# Patient Record
Sex: Male | Born: 1960 | Race: White | Hispanic: No | Marital: Single | Smoking: Current every day smoker
Health system: Southern US, Community
[De-identification: ages and names within clinical notes are randomized; demographics above are authoritative.]

## PROBLEM LIST (undated history)

## (undated) DIAGNOSIS — M869 Osteomyelitis, unspecified: Secondary | ICD-10-CM

---

## 2004-12-19 ENCOUNTER — Emergency Department (HOSPITAL_COMMUNITY): Admission: EM | Admit: 2004-12-19 | Discharge: 2004-12-19 | Payer: Self-pay | Admitting: Emergency Medicine

## 2006-07-16 ENCOUNTER — Emergency Department (HOSPITAL_COMMUNITY): Admission: EM | Admit: 2006-07-16 | Discharge: 2006-07-16 | Payer: Self-pay | Admitting: Emergency Medicine

## 2006-07-28 ENCOUNTER — Ambulatory Visit: Payer: Self-pay | Admitting: Cardiovascular Disease

## 2006-08-22 ENCOUNTER — Encounter: Payer: Self-pay | Admitting: Cardiovascular Disease

## 2006-08-22 ENCOUNTER — Ambulatory Visit: Payer: Self-pay

## 2006-09-24 ENCOUNTER — Ambulatory Visit: Payer: Self-pay | Admitting: Cardiovascular Disease

## 2008-01-05 ENCOUNTER — Ambulatory Visit: Payer: Self-pay | Admitting: Cardiovascular Disease

## 2011-04-23 NOTE — Assessment & Plan Note (Signed)
[facility]                            CARDIOLOGY OFFICE NOTE   NAME:Thomas, Noah L                       MRN:          [id number]  DATE:01/05/2008                            DOB:          June 29, 1961    Noah Thomas returns for follow-up at the [facility] office on  January 05, 2008.  Noah Thomas is a 50 year old gentleman with paroxysmal  SVT.  He was seen just over 1 year ago and he has improved on  metoprolol.  His metoprolol has been increased up to 100 mg daily.  He  really has not had much trouble with palpitations over the past year  since he has been on the higher dose of metoprolol.  He denies chest  pain, dyspnea, orthopnea, PND or other complaints.   Noah Thomas brings in copies of his recent blood work.  He had recent lab  work drawn on January 8 that showed a total cholesterol of 266,  triglycerides 907, HDL cholesterol of 20.  There was no direct LDL  available on the lab work.  His liver function tests and other blood  work were unremarkable.   MEDICATIONS:  1. Toprol XL 100 mg daily.  2. Hydrochlorothiazide 25 mg daily.   EXAM:  The patient is alert and oriented, in no acute distress.  Weight 237, blood pressure 160/100, heart rate 87, respiratory rate 16.  HEENT:  Normal.  NECK:  Normal carotid upstrokes without bruits.  Jugular venous pressure  is normal.  LUNGS:  Clear bilaterally.  HEART:  Regular rate and rhythm without murmurs or gallops.  ABDOMEN:  Soft, nontender, no organomegaly.  Obese.  EXTREMITIES:  No clubbing, cyanosis or edema.  Peripheral pulses 2+ and  equal throughout.  SKIN:  Warm and dry without rash.   EKG shows sinus rhythm with a ventricular rate of 87 beats per minute.   ASSESSMENT:  1. Paroxysmal supraventricular tachycardia.  Noah Thomas remains      minimally symptomatic.  Continue metoprolol.  Reserve EP evaluation      for breakthrough symptoms.  2. Hypertension.  Noah Thomas blood pressure  control appears to be      suboptimal.  He is currently on metoprolol and hydrochlorothiazide.      He tells me has been off of HCTZ for quite awhile and just started      it back in the last 2 days.  He is going to see Dr. Jeannetta Nap back in      the next several weeks and I will defer his blood pressure      treatment to Dr. Jeannetta Nap, especially in light of the fact that he      just restarted his hydrochlorothiazide.  3. Dyslipidemia.  His cholesterol is severely elevated.  I have taken      the liberty of starting him on Lipitor 20 mg daily.  He will have      follow-up lipids and LFTs in 12 weeks.  He prefers to have his labs      drawn at Dr. Jeannetta Nap' office, and he was given a  prescription for      his blood work.   For follow-up I will plan on seeing Noah Thomas back in 6 months.  I would  be happy to see him sooner if any problems arise.     Veverly Fells. Excell Seltzer, MD  Electronically Signed    MDC/MedQ  DD: 01/05/2008  DT: 01/06/2008  Job #: [id number]   cc:   Windle Guard, M.D.

## 2011-04-26 NOTE — Assessment & Plan Note (Signed)
[facility]                              CARDIOLOGY OFFICE NOTE   NAME:Perona, Tyan L                       MRN:          [id number]  DATE:07/28/2006                            DOB:          14-Jul-1961    Mr. Boyadjian is referred from the emergency room following an episode of  supraventricular tachycardia.   HISTORY OF PRESENT ILLNESS:  Mr. Bingley is a very pleasant 50 year old man who  presents following symptomatic SVT that required treatment with adenosine in  the emergency room on August 8 of this year.  He has a long standing history  of paroxysmal supraventricular tachycardia which he was evaluated for in  1995 and at that time was having very rare episodes, approximately 1-2 times  per year.  However his SVT has increased in frequency and he is currently  having approximately 8 episodes yearly per his report.   On August 8, he had two episodes of the abrupt onset of palpitations and  feeling of heart racing.  The first episode lasted one hour and was  associated with light headedness and chest tightness.  That abated on its  own.  The second episode prompted him to present to the emergency room  because it lasted approximately 2 hours.  It resolved with 6 mg of  adenosine.  He has had no further episodes since that time.   Over the  years he relates frequency of SVT to caffeine intake, lack of  sleep and times of high stress.  He does consume quite a bit of caffeinated  colas up to 1-2 liters daily.  He also smokes 1/2 pack of cigarettes daily.   He has never had an episode of SVT with exercise.  He is fairly active and  mows his yard regularly and jogs on occasion.   At the time of his evaluation here, back in 1995 he was offered an ET  evaluation and potential radiofrequency ablation but declined at that time.  He was treated with Beta-blocker therapy and uses this on an as needed basis  which he recalls being helpful.  He has not taken any  medications for SVT in  the past 2 years.   Outside of his episodes of tachycardia he denies any chest pain, dyspnea,  light headedness, syncope, orthopnea, PND or other complaints.   PAST MEDICAL HISTORY:  Pertinent for a prior back injury.   PAST SURGICAL HISTORY:  None.   SOCIAL HISTORY:  The patient is single, he lives alone.  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  He smokes 1/2 pack of cigarettes daily.  He does not drink alcohol or use  recreational drugs.  He walks vigorously 30 minutes daily.   FAMILY HISTORY:  His mother died of cancer.  His father is alive but has  coronary artery disease.  He has had angioplasty in the past.  His brother  is 40 years old and is healthy.   REVIEW OF SYSTEMS:  A complete 12 point review of systems was performed  and  is negative except as described above.   MEDICATIONS:  None.   ALLERGIES:  No known drug allergies.   EXAMINATION:  GENERAL:  The patient is alert and oriented.  He is in no  acute distress.  VITAL SIGNS:  His blood pressure is 152/88 on initial check.  On my check  his blood pressure was 150/90.  Heart rate is 90.  Respiratory rate is 12.  His weight is 236 pounds.  EYES:  Sclerae anicteric.  Conjunctiva pink.  Pupils are reactive and equal.  ENT:  Oropharynx is clear.  There are no oral lesions noted.  Moist oral  mucosa.  NECK:  There is no adenopathy. \  CARDIOVASCULAR:  Jugular venous pressure is normal.  Carotid upstrokes are  normal without bruits.  The cardiac apex is normal to palpation.  The heart  is regular rate and rhythm without murmurs or gallops.  LUNGS:  Clear to auscultation bilaterally.  ABDOMEN:  Obese, non-tender.  There is no hepatosplenomegaly appreciated.  There are no abdominal bruits present.  EXTREMITIES:  There is no clubbing, cyanosis or edema present.  Peripheral  pulses are 2+ and equal throughout.  NEUROLOGIC:  Grossly intact with 5/5  motor strength in the arms and legs  bilaterally.   Electrocardiogram in the office shows normal sinus rhythm with borderline  left atrial enlargement and a non-specific T-wave flattening.   LABORATORY DATA:  Labs reviewed from his emergency room visit show a  hemoglobin of 18, hematocrit of 53, platelet count of 315, creatinine is  1.0, electrolytes were normal.  Portable chest x-ray showed normal heart  size and clear lung fields.  His EKG from the emergency room on August 8  demonstrated narrow complex regular tachycardia with a rate ventricular rate  of 216.  There was diffuse ST depression present with this tachycardia.  I  am unable to appreciate P waves from this tracing.   ASSESSMENT:  1. This is a 50 year old male with paroxysmal supraventricular      tachycardia.  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  However other etiologies are possible and would be      difficult to diagnose without an invasive EP evaluation.  I discussed      possible treatment options which include the following:      a.     Reduction of caffeine intake and tobacco cessation.      b.     Medical therapy with either Beta-blocker or calcium channel       blocker therapy.      c.     Radiofrequency ablation.  I advised him that the only treatment that would likely be curative would be  radiofrequency ablation.  Discussed referral to Dr. Graciela Husbands who he has saw  several years ago.  He is reluctant to have any invasive procedures done and  at this time elects to try medical therapy.  Therefore I started him on 50  mg of Toprol XL and I would like to see him back in approximately 8 weeks  time to re-evaluate him.  If he is having break through episodes I think he  would benefit from radiofrequency ablation as a potential curative procedure.  In the meantime I have  also asked that he have a transthoracic   echocardiogram to rule out any structural heart  disease.  He does not have  any exertional symptoms and I do not feel like an ischemic evaluation is  warranted at this point.  1. Hypertension.  Will see how his blood pressure responds to Toprol XL      and if he continues to be hypertensive he will need more anti-      hypertensive medication for this.  2. Will follow-up with him in 8 weeks time to see how he has responded to      initial therapy.  We will also follow-up with him by telephone when the      results of this surface echocardiogram are back.                                 Micheline Chapman, MD    MDC/MedQ  DD:  07/28/2006  DT:  07/29/2006  Job #:  [id number]   cc:   Windle Guard, MD

## 2011-04-26 NOTE — Assessment & Plan Note (Signed)
[facility]                              CARDIOLOGY OFFICE NOTE   NAME:Noah Thomas                       MRN:          [id number]  DATE:09/24/2006                            DOB:          1961-03-14    Noah Thomas was seen back at the [facility] this afternoon  for followup of his symptomatic paroxysmal SVT. He was initially seen on  August 20 for symptomatic tachycardia.  His clinical history sounded like  typical paroxysmal SVT and he had an EKG that was done from an emergency  room visit that demonstrated narrow-complex regular tachycardia with an  ventricular rate near 220.  I started him on 50 mg of Toprol XL.  I offered  him an EP evaluation with potential ablation but he declined.   Since starting Toprol he has done well.  He has had a few episodes of  palpitations, but nothing prolonged.  His symptoms have definitely improved  since his initial evaluation.  He has had no chest pain, dyspnea or other  complaints.  He did have an echocardiogram done to rule out any structural  heart disease and he had normal left ventricular function with an ejection  fraction of 60%.  There was no significant valvular disease.  There was mild  left atrial dilatation.   His current medications include only Toprol XL 50 mg daily.   He has no allergies.   EXAM:  He is alert and oriented and in no acute distress.  His blood  pressure is 150/84.  Heart rate is 82.  Respiratory rate 16.  LUNGS:  Are clear to auscultation bilaterally.  CARDIAC:  Shows regular rate and rhythm without murmurs or gallops.  ABDOMEN:  Soft and nontender.  EXTREMITIES:  There is no clubbing, cyanosis or edema.   Noah Thomas is currently stable from a cardiovascular standpoint with regard to  his SVT.  He still is having intermittent symptoms and I have elected to  double his Toprol XL.  He has a heart rate of 82 and remains hypertensive,  so I think he will tolerate  this without difficulty.  He tells me that Dr.  Jeannetta Nap had started him on hydrochlorothiazide for treatment of his  hypertension, but he has not been taking this medication.  I advised him to  go ahead and have the medicine refilled and that he would benefit from both  the Toprol and hydrochlorothiazide as he is significantly hypertensive and  likely will require two medicines to control his blood pressure.  He would  like to continue with medical therapy and defer any ablation at this point.  I think that plan is  reasonable since he has had good symptomatic improvement.  I will plan on  seeing  him back in 1 year or sooner if any new problems arise.       Veverly Fells. Excell Seltzer, MD     MDC/MedQ  DD:  09/24/2006  DT:  09/26/2006  Job #:  [id number]   cc:   Noah Thomas, M.D.

## 2019-02-07 DIAGNOSIS — M869 Osteomyelitis, unspecified: Secondary | ICD-10-CM

## 2019-02-07 HISTORY — DX: Osteomyelitis, unspecified: M86.9

## 2019-02-27 ENCOUNTER — Inpatient Hospital Stay (HOSPITAL_COMMUNITY): Payer: Self-pay

## 2019-02-27 ENCOUNTER — Encounter (HOSPITAL_COMMUNITY): Payer: Self-pay | Admitting: Internal Medicine

## 2019-02-27 ENCOUNTER — Inpatient Hospital Stay (HOSPITAL_COMMUNITY)
Admission: EM | Admit: 2019-02-27 | Discharge: 2019-04-09 | DRG: 853 | Disposition: E | Payer: Self-pay | Attending: Internal Medicine | Admitting: Internal Medicine

## 2019-02-27 ENCOUNTER — Emergency Department (HOSPITAL_COMMUNITY): Payer: Self-pay

## 2019-02-27 DIAGNOSIS — T796XXA Traumatic ischemia of muscle, initial encounter: Secondary | ICD-10-CM

## 2019-02-27 DIAGNOSIS — S92424A Nondisplaced fracture of distal phalanx of right great toe, initial encounter for closed fracture: Secondary | ICD-10-CM | POA: Diagnosis present

## 2019-02-27 DIAGNOSIS — A419 Sepsis, unspecified organism: Principal | ICD-10-CM

## 2019-02-27 DIAGNOSIS — Z79899 Other long term (current) drug therapy: Secondary | ICD-10-CM

## 2019-02-27 DIAGNOSIS — I6523 Occlusion and stenosis of bilateral carotid arteries: Secondary | ICD-10-CM

## 2019-02-27 DIAGNOSIS — Z515 Encounter for palliative care: Secondary | ICD-10-CM

## 2019-02-27 DIAGNOSIS — F22 Delusional disorders: Secondary | ICD-10-CM

## 2019-02-27 DIAGNOSIS — E1165 Type 2 diabetes mellitus with hyperglycemia: Secondary | ICD-10-CM | POA: Diagnosis present

## 2019-02-27 DIAGNOSIS — R29701 NIHSS score 1: Secondary | ICD-10-CM | POA: Diagnosis not present

## 2019-02-27 DIAGNOSIS — E860 Dehydration: Secondary | ICD-10-CM | POA: Diagnosis present

## 2019-02-27 DIAGNOSIS — F17200 Nicotine dependence, unspecified, uncomplicated: Secondary | ICD-10-CM | POA: Diagnosis present

## 2019-02-27 DIAGNOSIS — I6330 Cerebral infarction due to thrombosis of unspecified cerebral artery: Secondary | ICD-10-CM

## 2019-02-27 DIAGNOSIS — S92404B Nondisplaced unspecified fracture of right great toe, initial encounter for open fracture: Secondary | ICD-10-CM

## 2019-02-27 DIAGNOSIS — G9389 Other specified disorders of brain: Secondary | ICD-10-CM | POA: Diagnosis present

## 2019-02-27 DIAGNOSIS — R400 Somnolence: Secondary | ICD-10-CM

## 2019-02-27 DIAGNOSIS — S92424B Nondisplaced fracture of distal phalanx of right great toe, initial encounter for open fracture: Secondary | ICD-10-CM

## 2019-02-27 DIAGNOSIS — Z7189 Other specified counseling: Secondary | ICD-10-CM

## 2019-02-27 DIAGNOSIS — G9341 Metabolic encephalopathy: Secondary | ICD-10-CM | POA: Diagnosis present

## 2019-02-27 DIAGNOSIS — F0390 Unspecified dementia without behavioral disturbance: Secondary | ICD-10-CM | POA: Diagnosis present

## 2019-02-27 DIAGNOSIS — M869 Osteomyelitis, unspecified: Secondary | ICD-10-CM | POA: Diagnosis present

## 2019-02-27 DIAGNOSIS — I63511 Cerebral infarction due to unspecified occlusion or stenosis of right middle cerebral artery: Secondary | ICD-10-CM | POA: Diagnosis present

## 2019-02-27 DIAGNOSIS — L0390 Cellulitis, unspecified: Secondary | ICD-10-CM | POA: Diagnosis present

## 2019-02-27 DIAGNOSIS — I745 Embolism and thrombosis of iliac artery: Secondary | ICD-10-CM | POA: Diagnosis present

## 2019-02-27 DIAGNOSIS — K760 Fatty (change of) liver, not elsewhere classified: Secondary | ICD-10-CM | POA: Diagnosis present

## 2019-02-27 DIAGNOSIS — S92911A Unspecified fracture of right toe(s), initial encounter for closed fracture: Secondary | ICD-10-CM | POA: Diagnosis present

## 2019-02-27 DIAGNOSIS — I10 Essential (primary) hypertension: Secondary | ICD-10-CM | POA: Diagnosis present

## 2019-02-27 DIAGNOSIS — R6520 Severe sepsis without septic shock: Secondary | ICD-10-CM | POA: Diagnosis present

## 2019-02-27 DIAGNOSIS — L03031 Cellulitis of right toe: Secondary | ICD-10-CM | POA: Diagnosis present

## 2019-02-27 DIAGNOSIS — I96 Gangrene, not elsewhere classified: Secondary | ICD-10-CM

## 2019-02-27 DIAGNOSIS — I6529 Occlusion and stenosis of unspecified carotid artery: Secondary | ICD-10-CM | POA: Diagnosis present

## 2019-02-27 DIAGNOSIS — E785 Hyperlipidemia, unspecified: Secondary | ICD-10-CM | POA: Diagnosis present

## 2019-02-27 DIAGNOSIS — Z66 Do not resuscitate: Secondary | ICD-10-CM

## 2019-02-27 DIAGNOSIS — E119 Type 2 diabetes mellitus without complications: Secondary | ICD-10-CM

## 2019-02-27 DIAGNOSIS — E1152 Type 2 diabetes mellitus with diabetic peripheral angiopathy with gangrene: Secondary | ICD-10-CM | POA: Diagnosis present

## 2019-02-27 DIAGNOSIS — Z590 Homelessness: Secondary | ICD-10-CM

## 2019-02-27 DIAGNOSIS — E440 Moderate protein-calorie malnutrition: Secondary | ICD-10-CM | POA: Diagnosis not present

## 2019-02-27 DIAGNOSIS — F99 Mental disorder, not otherwise specified: Secondary | ICD-10-CM

## 2019-02-27 DIAGNOSIS — R627 Adult failure to thrive: Secondary | ICD-10-CM | POA: Diagnosis present

## 2019-02-27 DIAGNOSIS — Z89411 Acquired absence of right great toe: Secondary | ICD-10-CM | POA: Diagnosis not present

## 2019-02-27 DIAGNOSIS — R7989 Other specified abnormal findings of blood chemistry: Secondary | ICD-10-CM

## 2019-02-27 DIAGNOSIS — R945 Abnormal results of liver function studies: Secondary | ICD-10-CM

## 2019-02-27 DIAGNOSIS — I998 Other disorder of circulatory system: Secondary | ICD-10-CM | POA: Diagnosis present

## 2019-02-27 DIAGNOSIS — M6282 Rhabdomyolysis: Secondary | ICD-10-CM | POA: Diagnosis present

## 2019-02-27 DIAGNOSIS — Z6832 Body mass index (BMI) 32.0-32.9, adult: Secondary | ICD-10-CM

## 2019-02-27 DIAGNOSIS — I6381 Other cerebral infarction due to occlusion or stenosis of small artery: Secondary | ICD-10-CM | POA: Diagnosis present

## 2019-02-27 DIAGNOSIS — I70291 Other atherosclerosis of native arteries of extremities, right leg: Secondary | ICD-10-CM | POA: Diagnosis present

## 2019-02-27 DIAGNOSIS — IMO0001 Reserved for inherently not codable concepts without codable children: Secondary | ICD-10-CM | POA: Diagnosis present

## 2019-02-27 HISTORY — DX: Osteomyelitis, unspecified: M86.9

## 2019-02-27 LAB — URINALYSIS, ROUTINE W REFLEX MICROSCOPIC
BACTERIA UA: NONE SEEN
BILIRUBIN URINE: NEGATIVE
Glucose, UA: 150 mg/dL — AB
Ketones, ur: 20 mg/dL — AB
Leukocytes,Ua: NEGATIVE
Nitrite: NEGATIVE
PH: 5 (ref 5.0–8.0)
PROTEIN: NEGATIVE mg/dL
Specific Gravity, Urine: 1.026 (ref 1.005–1.030)

## 2019-02-27 LAB — COMPREHENSIVE METABOLIC PANEL
ALT: 69 U/L — ABNORMAL HIGH (ref 0–44)
AST: 136 U/L — ABNORMAL HIGH (ref 15–41)
Albumin: 3.7 g/dL (ref 3.5–5.0)
Alkaline Phosphatase: 98 U/L (ref 38–126)
Anion gap: 17 — ABNORMAL HIGH (ref 5–15)
BILIRUBIN TOTAL: 1.5 mg/dL — AB (ref 0.3–1.2)
BUN: 33 mg/dL — ABNORMAL HIGH (ref 6–20)
CHLORIDE: 101 mmol/L (ref 98–111)
CO2: 23 mmol/L (ref 22–32)
Calcium: 9.7 mg/dL (ref 8.9–10.3)
Creatinine, Ser: 1.11 mg/dL (ref 0.61–1.24)
Glucose, Bld: 211 mg/dL — ABNORMAL HIGH (ref 70–99)
POTASSIUM: 4.1 mmol/L (ref 3.5–5.1)
Sodium: 141 mmol/L (ref 135–145)
TOTAL PROTEIN: 7.5 g/dL (ref 6.5–8.1)

## 2019-02-27 LAB — CBG MONITORING, ED
GLUCOSE-CAPILLARY: 193 mg/dL — AB (ref 70–99)
Glucose-Capillary: 233 mg/dL — ABNORMAL HIGH (ref 70–99)

## 2019-02-27 LAB — CBC WITH DIFFERENTIAL/PLATELET
Abs Immature Granulocytes: 0.13 10*3/uL — ABNORMAL HIGH (ref 0.00–0.07)
BASOS PCT: 0 %
Basophils Absolute: 0.1 10*3/uL (ref 0.0–0.1)
EOS ABS: 0 10*3/uL (ref 0.0–0.5)
Eosinophils Relative: 0 %
HEMATOCRIT: 55.4 % — AB (ref 39.0–52.0)
Hemoglobin: 18.4 g/dL — ABNORMAL HIGH (ref 13.0–17.0)
Immature Granulocytes: 1 %
LYMPHS ABS: 1.8 10*3/uL (ref 0.7–4.0)
Lymphocytes Relative: 12 %
MCH: 29.3 pg (ref 26.0–34.0)
MCHC: 33.2 g/dL (ref 30.0–36.0)
MCV: 88.4 fL (ref 80.0–100.0)
MONO ABS: 1.8 10*3/uL — AB (ref 0.1–1.0)
MONOS PCT: 12 %
Neutro Abs: 10.9 10*3/uL — ABNORMAL HIGH (ref 1.7–7.7)
Neutrophils Relative %: 75 %
PLATELETS: 314 10*3/uL (ref 150–400)
RBC: 6.27 MIL/uL — ABNORMAL HIGH (ref 4.22–5.81)
RDW: 12.8 % (ref 11.5–15.5)
WBC: 14.6 10*3/uL — ABNORMAL HIGH (ref 4.0–10.5)
nRBC: 0 % (ref 0.0–0.2)

## 2019-02-27 LAB — LIPASE, BLOOD: LIPASE: 36 U/L (ref 11–51)

## 2019-02-27 LAB — LACTIC ACID, PLASMA
LACTIC ACID, VENOUS: 2.3 mmol/L — AB (ref 0.5–1.9)
LACTIC ACID, VENOUS: 2 mmol/L — AB (ref 0.5–1.9)

## 2019-02-27 LAB — TROPONIN I
Troponin I: 0.03 ng/mL (ref <0.03)
Troponin I: <0.03 ng/mL (ref <0.03)

## 2019-02-27 LAB — AMMONIA: Ammonia: 25 umol/L (ref 9–35)

## 2019-02-27 LAB — HEMOGLOBIN A1C
Hgb A1c MFr Bld: 7.7 % — ABNORMAL HIGH (ref 4.8–5.6)
Mean Plasma Glucose: 174.29 mg/dL

## 2019-02-27 LAB — CK: CK TOTAL: 3323 U/L — AB (ref 49–397)

## 2019-02-27 LAB — TSH: TSH: 0.664 u[IU]/mL (ref 0.350–4.500)

## 2019-02-27 LAB — ETHANOL

## 2019-02-27 MED ORDER — ADULT MULTIVITAMIN W/MINERALS CH
1.0000 | ORAL_TABLET | Freq: Every day | ORAL | Status: DC
  Administered 2019-02-28 – 2019-03-04 (×4): 1 via ORAL
  Filled 2019-02-27 (×4): qty 1

## 2019-02-27 MED ORDER — ACETAMINOPHEN 325 MG PO TABS: 650.0000 mg | ORAL_TABLET | Freq: Four times a day (QID) | ORAL | Status: DC | PRN

## 2019-02-27 MED ORDER — METRONIDAZOLE IN NACL 5-0.79 MG/ML-% IV SOLN
500.0000 mg | Freq: Once | INTRAVENOUS | Status: AC
  Administered 2019-02-27: 500 mg via INTRAVENOUS
  Filled 2019-02-27: qty 100

## 2019-02-27 MED ORDER — INSULIN ASPART 100 UNIT/ML ~~LOC~~ SOLN
0.0000 [IU] | Freq: Three times a day (TID) | SUBCUTANEOUS | Status: DC
  Administered 2019-02-28: 7 [IU] via SUBCUTANEOUS
  Administered 2019-03-01 (×2): 1 [IU] via SUBCUTANEOUS
  Administered 2019-03-02: 2 [IU] via SUBCUTANEOUS
  Administered 2019-03-02 – 2019-03-03 (×2): 1 [IU] via SUBCUTANEOUS
  Administered 2019-03-03: 3 [IU] via SUBCUTANEOUS
  Administered 2019-03-04: 1 [IU] via SUBCUTANEOUS
  Administered 2019-03-04: 3 [IU] via SUBCUTANEOUS

## 2019-02-27 MED ORDER — SENNOSIDES-DOCUSATE SODIUM 8.6-50 MG PO TABS: 1.0000 | ORAL_TABLET | Freq: Every evening | ORAL | Status: DC | PRN

## 2019-02-27 MED ORDER — VANCOMYCIN HCL IN DEXTROSE 1-5 GM/200ML-% IV SOLN: 1000.0000 mg | Freq: Once | INTRAVENOUS | Status: DC

## 2019-02-27 MED ORDER — THIAMINE HCL 100 MG/ML IJ SOLN
Freq: Once | INTRAVENOUS | Status: DC
  Filled 2019-02-27: qty 1000

## 2019-02-27 MED ORDER — VANCOMYCIN HCL 10 G IV SOLR
1500.0000 mg | Freq: Once | INTRAVENOUS | Status: AC
  Administered 2019-02-27: 1500 mg via INTRAVENOUS
  Filled 2019-02-27: qty 1500

## 2019-02-27 MED ORDER — VANCOMYCIN HCL IN DEXTROSE 1-5 GM/200ML-% IV SOLN
1000.0000 mg | Freq: Two times a day (BID) | INTRAVENOUS | Status: DC
  Administered 2019-02-28: 1000 mg via INTRAVENOUS
  Filled 2019-02-27 (×3): qty 200

## 2019-02-27 MED ORDER — NICOTINE 21 MG/24HR TD PT24
21.0000 mg | MEDICATED_PATCH | Freq: Every day | TRANSDERMAL | Status: DC
  Administered 2019-02-28 – 2019-03-08 (×9): 21 mg via TRANSDERMAL
  Filled 2019-02-27 (×10): qty 1

## 2019-02-27 MED ORDER — SODIUM CHLORIDE 0.9 % IV SOLN
2.0000 g | Freq: Two times a day (BID) | INTRAVENOUS | Status: AC
  Administered 2019-02-28 – 2019-03-02 (×4): 2 g via INTRAVENOUS
  Filled 2019-02-27 (×6): qty 2

## 2019-02-27 MED ORDER — SODIUM CHLORIDE 0.9% FLUSH
3.0000 mL | Freq: Two times a day (BID) | INTRAVENOUS | Status: DC
  Administered 2019-02-28 – 2019-03-03 (×3): 3 mL via INTRAVENOUS

## 2019-02-27 MED ORDER — ACETAMINOPHEN 650 MG RE SUPP: 650.0000 mg | Freq: Four times a day (QID) | RECTAL | Status: DC | PRN

## 2019-02-27 MED ORDER — SODIUM CHLORIDE 0.9 % IV BOLUS
1000.0000 mL | Freq: Once | INTRAVENOUS | Status: AC
  Administered 2019-02-27: 1000 mL via INTRAVENOUS

## 2019-02-27 MED ORDER — SODIUM CHLORIDE 0.9 % IV SOLN
INTRAVENOUS | Status: DC
  Administered 2019-02-28 – 2019-03-01 (×3): via INTRAVENOUS

## 2019-02-27 MED ORDER — VITAMIN B-1 100 MG PO TABS
100.0000 mg | ORAL_TABLET | Freq: Every day | ORAL | Status: DC
  Administered 2019-02-28 – 2019-03-04 (×4): 100 mg via ORAL
  Filled 2019-02-27 (×4): qty 1

## 2019-02-27 MED ORDER — FOLIC ACID 1 MG PO TABS
1.0000 mg | ORAL_TABLET | Freq: Every day | ORAL | Status: DC
  Administered 2019-02-28 – 2019-03-04 (×4): 1 mg via ORAL
  Filled 2019-02-27 (×4): qty 1

## 2019-02-27 MED ORDER — SODIUM CHLORIDE 0.9 % IV SOLN
2.0000 g | Freq: Once | INTRAVENOUS | Status: AC
  Administered 2019-02-27: 2 g via INTRAVENOUS
  Filled 2019-02-27: qty 2

## 2019-02-27 MED ORDER — ENOXAPARIN SODIUM 40 MG/0.4ML ~~LOC~~ SOLN
40.0000 mg | SUBCUTANEOUS | Status: DC
  Administered 2019-02-28 (×2): 40 mg via SUBCUTANEOUS
  Filled 2019-02-27 (×2): qty 0.4

## 2019-02-27 MED ORDER — ALBUTEROL SULFATE (2.5 MG/3ML) 0.083% IN NEBU: 2.5000 mg | INHALATION_SOLUTION | RESPIRATORY_TRACT | Status: DC | PRN

## 2019-02-27 NOTE — ED Notes (Signed)
Dr. Jacqulyn Bath notified of elevated lactic

## 2019-02-27 NOTE — ED Triage Notes (Signed)
Pt  Here from home with c/o aloc , last saw by family on the 13th of march , cbg 250 , pt received 250 of fluid by ems

## 2019-02-27 NOTE — ED Notes (Signed)
Rosanne Ashing (pt's brother) : [phone]

## 2019-02-27 NOTE — ED Notes (Signed)
Patient transported to MRI 

## 2019-02-27 NOTE — ED Notes (Signed)
Patient transported to X-ray 

## 2019-02-27 NOTE — ED Notes (Signed)
ED TO INPATIENT HANDOFF REPORT  ED Nurse Name and Phone #: Henderson [place] [phone]  S Name/Age/Gender Noah Thomas 58 y.o. male Room/Bed: 034C/034C  Code Status   Code Status: Not on file  Home/SNF/Other Home Patient oriented to: self and place Is this baseline? No   Triage Complete: Triage complete  Chief Complaint ams  Triage Note Pt  Here from home with c/o aloc , last saw by family on the 13th of march , cbg 250 , pt received 250 of fluid by ems    Allergies No Known Allergies  Level of Care/Admitting Diagnosis ED Disposition    ED Disposition Condition Comment   Admit  Hospital Area: [facility] [[id number]]  Level of Care: Telemetry Medical [104]  Diagnosis: Acute metabolic encephalopathy [2800349]  Admitting Physician: Elease Etienne [[id number]]  Attending Physician: Marcellus Scott D [[id number]]  Estimated length of stay: past midnight tomorrow  Certification:: I certify this patient will need inpatient services for at least 2 midnights  PT Class (Do Not Modify): Inpatient [101]  PT Acc Code (Do Not Modify): Private [1]       B Medical/Surgery History History reviewed. No pertinent past medical history. History reviewed. No pertinent surgical history.   A IV Location/Drains/Wounds Patient Lines/Drains/Airways Status   Active Line/Drains/Airways    Name:   Placement date:   Placement time:   Site:   Days:   Peripheral IV 21-Mar-2019 Left Hand   2019/03/21    1811    Hand   less than 1          Intake/Output Last 24 hours  Intake/Output Summary (Last 24 hours) at 03/21/2019 1818 Last data filed at Mar 21, 2019 1715 Gross per 24 hour  Intake 1100.02 ml  Output -  Net 1100.02 ml    Labs/Imaging Results for orders placed or performed during the hospital encounter of 2019-03-21 (from the past 48 hour(s))  CBG monitoring, ED     Status: Abnormal   Collection Time: March 21, 2019  2:45 PM  Result Value Ref Range   Glucose-Capillary 233 (H) 70 - 99 mg/dL   Comprehensive metabolic panel     Status: Abnormal   Collection Time: March 21, 2019  2:50 PM  Result Value Ref Range   Sodium 141 135 - 145 mmol/L   Potassium 4.1 3.5 - 5.1 mmol/L   Chloride 101 98 - 111 mmol/L   CO2 23 22 - 32 mmol/L   Glucose, Bld 211 (H) 70 - 99 mg/dL   BUN 33 (H) 6 - 20 mg/dL   Creatinine, Ser 1.79 0.61 - 1.24 mg/dL   Calcium 9.7 8.9 - 15.0 mg/dL   Total Protein 7.5 6.5 - 8.1 g/dL   Albumin 3.7 3.5 - 5.0 g/dL   AST 569 (H) 15 - 41 U/L   ALT 69 (H) 0 - 44 U/L   Alkaline Phosphatase 98 38 - 126 U/L   Total Bilirubin 1.5 (H) 0.3 - 1.2 mg/dL   GFR calc non Af Amer >60 >60 mL/min   GFR calc Af Amer >60 >60 mL/min   Anion gap 17 (H) 5 - 15    Comment: Performed at [facility], [address], [place], [place] [address]  Ethanol     Status: None   Collection Time: 03/21/2019  2:50 PM  Result Value Ref Range   Alcohol, Ethyl (B) <10 <10 mg/dL    Comment: (NOTE) Lowest detectable limit for serum alcohol is 10 mg/dL. For medical purposes only. Performed at  [facility], [address] [place], [place] [address]   Lipase, blood     Status: None   Collection Time: 2019/03/11  2:50 PM  Result Value Ref Range   Lipase 36 11 - 51 U/L    Comment: Performed at [facility], [address] [place], [place] [address]  Troponin I - Once     Status: Abnormal   Collection Time: 03-11-19  2:50 PM  Result Value Ref Range   Troponin I 0.03 (HH) <0.03 ng/mL    Comment: CRITICAL RESULT CALLED TO, READ BACK BY AND VERIFIED WITH: H.BANCRETSCHMER,RN@ 1745 March 11, 2019 WEBBERJ Performed at [facility], [address] [place], [place] [address]   Lactic acid, plasma     Status: Abnormal   Collection Time: Mar 11, 2019  2:50 PM  Result Value Ref Range   Lactic Acid, Venous 2.3 (HH) 0.5 - 1.9 mmol/L    Comment: CRITICAL RESULT CALLED TO, READ BACK BY AND VERIFIED WITH: H.BANCRETSCHMER,RN @ 1609 2019-03-11 WEBBERJ Performed at [facility], [address] [place], [place] [address]   CBC with Differential     Status: Abnormal   Collection Time: 2019-03-11  2:50 PM  Result Value Ref Range   WBC 14.6 (H) 4.0 - 10.5 K/uL   RBC 6.27 (H) 4.22 - 5.81 MIL/uL   Hemoglobin 18.4 (H) 13.0 - 17.0 g/dL   HCT 29.5 (H) 62.1 - 30.8 %   MCV 88.4 80.0 - 100.0 fL   MCH 29.3 26.0 - 34.0 pg   MCHC 33.2 30.0 - 36.0 g/dL   RDW 65.7 84.6 - 96.2 %   Platelets 314 150 - 400 K/uL   nRBC 0.0 0.0 - 0.2 %   Neutrophils Relative % 75 %   Neutro Abs 10.9 (H) 1.7 - 7.7 K/uL   Lymphocytes Relative 12 %   Lymphs Abs 1.8 0.7 - 4.0 K/uL   Monocytes Relative 12 %   Monocytes Absolute 1.8 (H) 0.1 - 1.0 K/uL   Eosinophils Relative 0 %   Eosinophils Absolute 0.0 0.0 - 0.5 K/uL   Basophils Relative 0 %   Basophils Absolute 0.1 0.0 - 0.1 K/uL   Immature Granulocytes 1 %   Abs Immature Granulocytes 0.13 (H) 0.00 - 0.07 K/uL    Comment: Performed at [facility], [address] [place], [place] [address]  CK     Status: Abnormal   Collection Time: 2019-03-11  2:50 PM  Result Value Ref Range   Total CK 3,323 (H) 49 - 397 U/L    Comment: Performed at [facility], [address], [place], [place] [address]  TSH     Status: None   Collection Time: 2019/03/11  2:50 PM  Result Value Ref Range   TSH 0.664 0.350 - 4.500 uIU/mL    Comment: Performed by a 3rd Generation assay with a functional sensitivity of <=0.01 uIU/mL. Performed at [facility], [address], [place], [place] [address]   Ammonia     Status: None   Collection Time: 03-11-19  3:19 PM  Result Value Ref Range   Ammonia 25 9 - 35 umol/L    Comment: Performed at [facility], [address], [place], [place] [address]  Urinalysis, Routine w reflex microscopic     Status: Abnormal   Collection Time: March 11, 2019  4:04 PM  Result Value Ref Range   Color, Urine YELLOW YELLOW   APPearance CLEAR CLEAR  Specific Gravity, Urine 1.026 1.005 - 1.030   pH 5.0 5.0 - 8.0   Glucose, UA 150 (A)  NEGATIVE mg/dL   Hgb urine dipstick SMALL (A) NEGATIVE   Bilirubin Urine NEGATIVE NEGATIVE   Ketones, ur 20 (A) NEGATIVE mg/dL   Protein, ur NEGATIVE NEGATIVE mg/dL   Nitrite NEGATIVE NEGATIVE   Leukocytes,Ua NEGATIVE NEGATIVE   RBC / HPF 0-5 0 - 5 RBC/hpf   WBC, UA 0-5 0 - 5 WBC/hpf   Bacteria, UA NONE SEEN NONE SEEN   Mucus PRESENT     Comment: Performed at [facility], [address] [place], [place] [address]   Dg Chest 2 View  Result Date: 03/05/2019 CLINICAL DATA:  Altered mental status today. EXAM: CHEST - 2 VIEW COMPARISON:  Single-view of the chest 07/16/2006. FINDINGS: Lungs clear. Heart size normal. No pneumothorax or pleural fluid. No acute or focal bony abnormality. IMPRESSION: Negative chest. Electronically Signed   By: Drusilla Kanner M.D.   On: 02/12/2019 16:00   Dg Knee 2 Views Right  Result Date: 03/05/2019 CLINICAL DATA:  Knee pain, initial encounter EXAM: RIGHT KNEE - 2 VIEW COMPARISON:  None. FINDINGS: Mild medial joint space narrowing is noted. No acute fracture or dislocation is seen. Mild patellofemoral spurring is noted as well. IMPRESSION: Mild degenerative change without acute abnormality. Electronically Signed   By: Alcide Clever M.D.   On: 02/24/2019 16:03   Ct Head Wo Contrast  Result Date: 03/04/2019 CLINICAL DATA:  Altered level of consciousness EXAM: CT HEAD WITHOUT CONTRAST TECHNIQUE: Contiguous axial images were obtained from the base of the skull through the vertex without intravenous contrast. COMPARISON:  None. FINDINGS: Brain: Mild atrophic changes are noted. Encephalomalacia changes are seen in the distribution of the right middle cerebral artery consistent with prior infarct. Rounded decreased area of attenuation is noted in the region of the internal capsule on the left suggestive of subacute to chronic ischemia. No focal area of acute infarct or acute hemorrhage is seen. No space-occupying mass lesion is noted. Vascular: No hyperdense vessel  or unexpected calcification. Skull: Normal. Negative for fracture or focal lesion. Sinuses/Orbits: No acute finding. Other: None. IMPRESSION: Chronic atrophic changes. 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 Rounded somewhat elongated area of decreased attenuation on the left in the region of the internal capsule and basal ganglia consistent with subacute to chronic ischemia. No acute infarct is noted. Electronically Signed   By: Alcide Clever M.D.   On: 02/15/2019 16:06   Dg Foot Complete Right  Result Date: 03/04/2019 CLINICAL DATA:  Right foot pain EXAM: RIGHT FOOT COMPLETE - 3+ VIEW COMPARISON:  None. FINDINGS: Comminuted fracture is noted at the base of the first distal phalanx. This extends into the articular surface in multiple locations. No other fracture is seen. Soft tissue swelling is noted. IMPRESSION: Comminuted fracture of the first distal phalanx which extends to the articular surface. Electronically Signed   By: Alcide Clever M.D.   On: 02/26/2019 16:01    Pending Labs Unresulted Labs (From admission, onward)    Start     Ordered   02/15/2019 1509  Lactic acid, plasma  Now then every 2 hours,   STAT     02/26/2019 1510   02/18/2019 1509  Urine culture  ONCE - STAT,   STAT    Question:  Patient immune status  Answer:  Normal   02/13/2019 1510          Vitals/Pain Today's Vitals  Mar 02, 2019 1630 March 02, 2019 1700 02/22/2019 1715 02/26/2019 1815  BP: (!) 170/98 (!) 166/97 (!) 155/88   Pulse: (!) 109 (!) 102 (!) 104 (!) 105  Resp: 18 15 17 18   Temp:      TempSrc:      SpO2: 97% 96% 100% 96%  Weight:      Height:      PainSc:        Isolation Precautions No active isolations  Medications Medications  vancomycin (VANCOCIN) 1,500 mg in sodium chloride 0.9 % 500 mL IVPB (1,500 mg Intravenous New Bag/Given 02/19/2019 1717)  vancomycin (VANCOCIN) IVPB 1000 mg/200 mL premix (has no administration in time range)  ceFEPIme (MAXIPIME) 2 g in sodium chloride 0.9 % 100 mL IVPB  (has no administration in time range)  sodium chloride 0.9 % bolus 1,000 mL (0 mLs Intravenous Stopped 03/09/2019 1706)  ceFEPIme (MAXIPIME) 2 g in sodium chloride 0.9 % 100 mL IVPB (0 g Intravenous Stopped 02/13/2019 1715)  metroNIDAZOLE (FLAGYL) IVPB 500 mg (500 mg Intravenous New Bag/Given 02/17/2019 1707)    Mobility walks with person assist High fall risk   Focused Assessments Cardiac Assessment Handoff:  Cardiac Rhythm: Sinus tachycardia Lab Results  Component Value Date   CKTOTAL 3,323 (H) 02/14/2019   TROPONINI 0.03 (HH) 02-Mar-2019   No results found for: DDIMER Does the Patient currently have chest pain? No     R Recommendations: See Admitting Provider Note  Report given to:   Additional Notes:

## 2019-02-27 NOTE — ED Provider Notes (Signed)
Emergency Department Provider Note   I have reviewed the triage vital signs and the nursing notes.   HISTORY  Chief Complaint Altered Mental Status   HPI Noah Thomas is a 57 y.o. male presents to the ED by EMS with altered mental status.  The patient lives alone and was last seen by family on March 13.  The family member who tracks his finances saw that the spending was decreased and went to check on him.  He could reportedly hear him yelling from inside the house and went in to find him on the ground.  He was confused and EMS was called.  Patient reports history of hypertension but cannot tell me his medications or other medical problems.  Level 5 caveat applies with acute confusion and somnolence.  EMS gave 250 mL's of fluid and checked a blood sugar which was 250.   04:13 PM  Was able to get of the patient's brother by phone. Changed listed contact information to the correct contact information.  Patient's brother states that he has really never seen a doctor for any prolonged period of time.  He suspects he may have underlying mental illness but nothing is diagnosed.  He states that he refuses help from family and refuses to see a primary care physician.  He does smoke cigarettes but family does not suspect he is using drugs.  They do not feel he drinks alcohol regularly.  The patient's brother has been trying to reach him by phone for several days and went over to the house yesterday but did not get an answer at the door.  He returned today where he heard some soft noises inside and called 911.  They made entry into the house and found the patient confused.   No past medical history on file.  Patient Active Problem List   Diagnosis Date Noted   Acute metabolic encephalopathy 02/20/2019   Toe fracture, right 1st with cellulitis and?  Gangrene versus hematoma. 02/14/2019   Allergies Patient has no known allergies.  No family history on file.  Social History Social History     Tobacco Use   Smoking status: Not on file  Substance Use Topics   Alcohol use: Not on file   Drug use: Not on file    Review of Systems  Level 5 caveat: AMS  ____________________________________________   PHYSICAL EXAM:  VITAL SIGNS: ED Triage Vitals  Enc Vitals Group     BP 02/14/2019 1442 (!) 175/106     Pulse Rate 03/06/2019 1441 (!) 109     Resp 02/16/2019 1441 16     Temp 02/12/2019 1444 98.2 F (36.8 C)     Temp Source 02/15/2019 1444 Oral     SpO2 02/26/2019 1441 95 %     Weight 02/14/2019 1454 180 lb (81.6 kg)     Height 02/20/2019 1454 6' (1.829 m)   Constitutional: Somnolent but awakens to give history. Well appearing and in no acute distress. Patient rolled and covered in leaves, old cigarettes, and small specks of broken glass.  Eyes: Conjunctivae are normal. PERRL. EOMI. Head: Atraumatic. Nose: No congestion/rhinnorhea. Mouth/Throat: Mucous membranes are moist.   Neck: No stridor.   Cardiovascular: Tachycardia. Good peripheral circulation. Grossly normal heart sounds.   Respiratory: Normal respiratory effort.  No retractions. Lungs CTAB. Gastrointestinal: Soft and nontender. No distention.  Musculoskeletal: No lower extremity tenderness nor edema. Old appearing abrasion to the right knee and right great toe. Normal passive ROM of the hips,  knees, and ankles.  Neurologic:  Normal speech and language. No gross focal neurologic deficits are appreciated.  Skin:  Skin is warm, dry and intact. Dried blood and ecchymosis over the right toe. After cleaning the dried blood appears to be gangrenous with partial nail avulsion. Erythema over the back and buttocks. Left lower back skin breakdown (shallow) without purulent material.      ____________________________________________   LABS (all labs ordered are listed, but only abnormal results are displayed)  Labs Reviewed  COMPREHENSIVE METABOLIC PANEL - Abnormal; Notable for the following components:      Result Value    Glucose, Bld 211 (*)    BUN 33 (*)    AST 136 (*)    ALT 69 (*)    Total Bilirubin 1.5 (*)    Anion gap 17 (*)    All other components within normal limits  TROPONIN I - Abnormal; Notable for the following components:   Troponin I 0.03 (*)    All other components within normal limits  LACTIC ACID, PLASMA - Abnormal; Notable for the following components:   Lactic Acid, Venous 2.3 (*)    All other components within normal limits  CBC WITH DIFFERENTIAL/PLATELET - Abnormal; Notable for the following components:   WBC 14.6 (*)    RBC 6.27 (*)    Hemoglobin 18.4 (*)    HCT 55.4 (*)    Neutro Abs 10.9 (*)    Monocytes Absolute 1.8 (*)    Abs Immature Granulocytes 0.13 (*)    All other components within normal limits  URINALYSIS, ROUTINE W REFLEX MICROSCOPIC - Abnormal; Notable for the following components:   Glucose, UA 150 (*)    Hgb urine dipstick SMALL (*)    Ketones, ur 20 (*)    All other components within normal limits  CK - Abnormal; Notable for the following components:   Total CK 3,323 (*)    All other components within normal limits  CBG MONITORING, ED - Abnormal; Notable for the following components:   Glucose-Capillary 233 (*)    All other components within normal limits  URINE CULTURE  ETHANOL  LIPASE, BLOOD  AMMONIA  TSH  LACTIC ACID, PLASMA   ____________________________________________  EKG   EKG Interpretation  Date/Time:  Saturday February 27 2019 14:39:21 EDT Ventricular Rate:  112 PR Interval:    QRS Duration: 93 QT Interval:  376 QTC Calculation: 514 R Axis:   84 Text Interpretation:  Fast sinus arrhythmia Probable left atrial enlargement Prolonged QT interval No significant change since last tracing Abnormal ekg Confirmed by Gerhard Munch [phone]) on 02/23/2019 3:02:19 PM       ____________________________________________  RADIOLOGY  Dg Chest 2 View  Result Date: 02/25/2019 CLINICAL DATA:  Altered mental status today. EXAM: CHEST - 2 VIEW  COMPARISON:  Single-view of the chest 07/16/2006. FINDINGS: Lungs clear. Heart size normal. No pneumothorax or pleural fluid. No acute or focal bony abnormality. IMPRESSION: Negative chest. Electronically Signed   By: Drusilla Kanner M.D.   On: 02/24/2019 16:00   Dg Knee 2 Views Right  Result Date: 02/09/2019 CLINICAL DATA:  Knee pain, initial encounter EXAM: RIGHT KNEE - 2 VIEW COMPARISON:  None. FINDINGS: Mild medial joint space narrowing is noted. No acute fracture or dislocation is seen. Mild patellofemoral spurring is noted as well. IMPRESSION: Mild degenerative change without acute abnormality. Electronically Signed   By: Alcide Clever M.D.   On: 03/04/2019 16:03   Ct Head Wo Contrast  Result Date: 02/07/2019  CLINICAL DATA:  Altered level of consciousness EXAM: CT HEAD WITHOUT CONTRAST TECHNIQUE: Contiguous axial images were obtained from the base of the skull through the vertex without intravenous contrast. COMPARISON:  None. FINDINGS: Brain: Mild atrophic changes are noted. Encephalomalacia changes are seen in the distribution of the right middle cerebral artery consistent with prior infarct. Rounded decreased area of attenuation is noted in the region of the internal capsule on the left suggestive of subacute to chronic ischemia. No focal area of acute infarct or acute hemorrhage is seen. No space-occupying mass lesion is noted. Vascular: No hyperdense vessel or unexpected calcification. Skull: Normal. Negative for fracture or focal lesion. Sinuses/Orbits: No acute finding. Other: None. IMPRESSION: Chronic atrophic changes. 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 Rounded somewhat elongated area of decreased attenuation on the left in the region of the internal capsule and basal ganglia consistent with subacute to chronic ischemia. No acute infarct is noted. Electronically Signed   By: Alcide CleverMark  Lukens M.D.   On: 02/17/2019 16:06   Dg Foot Complete Right  Result Date:  03/09/2019 CLINICAL DATA:  Right foot pain EXAM: RIGHT FOOT COMPLETE - 3+ VIEW COMPARISON:  None. FINDINGS: Comminuted fracture is noted at the base of the first distal phalanx. This extends into the articular surface in multiple locations. No other fracture is seen. Soft tissue swelling is noted. IMPRESSION: Comminuted fracture of the first distal phalanx which extends to the articular surface. Electronically Signed   By: Alcide CleverMark  Lukens M.D.   On: 02/26/2019 16:01    ____________________________________________   PROCEDURES  Procedure(s) performed:   Procedures  CRITICAL CARE Performed by: Maia PlanJoshua G Pierrette Scheu Total critical care time: 35 minutes Critical care time was exclusive of separately billable procedures and treating other patients. Critical care was necessary to treat or prevent imminent or life-threatening deterioration. Critical care was time spent personally by me on the following activities: development of treatment plan with patient and/or surrogate as well as nursing, discussions with consultants, evaluation of patient's response to treatment, examination of patient, obtaining history from patient or surrogate, ordering and performing treatments and interventions, ordering and review of laboratory studies, ordering and review of radiographic studies, pulse oximetry and re-evaluation of patient's condition.  Alona BeneJoshua Alyda Megna, MD Emergency Medicine  ____________________________________________   INITIAL IMPRESSION / ASSESSMENT AND PLAN / ED COURSE  Pertinent labs & imaging results that were available during my care of the patient were reviewed by me and considered in my medical decision making (see chart for details).  Patient presents to the emergency department with acute mental status change.  Unclear exactly what is happened since March 13 as the patient lives alone and is unable to provide significant history.  He does smell of urine.  He has what appears to be an old injury to the  right toe and right knee.  Possible fall.  No obvious head injury on exam.  Patient has tachycardia and hypertension.  No fever.  No specific respiratory complaints or findings on exam.  Plan for IV fluids, broad labs, CT imaging of the head, plain films, reassess. Attempted to call the emergency contact number listed but when called states this # is not in service. No family at bedside.   04:13 PM  Spoke with the patient's brother as above.  Patient with leukocytosis to 14 and elevated lactate.  He has persistent tachycardia but no hypotension.  Continue IV fluids.  I have started broad-spectrum antibiotics with concern for underlying sepsis and I am treating  for an unknown source at this time.  UA in process.  CT imaging shows subacute to chronic findings.  Given the time since last seen normal it does seem possible that the patient could have had an acute stroke which is now become subacute and has led to falls.  The patient does appear to have developed sepsis but unclear if this is the primary or a secondary event.   05:00 PM  Discussed toe injury and possible infection with ortho, Dr. Everardo Pacific. He will have Douda see the patient in the AM.   Discussed patient's case with Hospitalist to request admission. Patient and family (if present) updated with plan. Care transferred to Hospitalist service.  I reviewed all nursing notes, vitals, pertinent old records, EKGs, labs, imaging (as available).  ____________________________________________  FINAL CLINICAL IMPRESSION(S) / ED DIAGNOSES  Final diagnoses:  Somnolence  Traumatic rhabdomyolysis, initial encounter (HCC)  Open nondisplaced fracture of distal phalanx of right great toe, initial encounter  Sepsis, due to unspecified organism, unspecified whether acute organ dysfunction present (HCC)     MEDICATIONS GIVEN DURING THIS VISIT:  Medications  vancomycin (VANCOCIN) 1,500 mg in sodium chloride 0.9 % 500 mL IVPB (1,500 mg Intravenous New  Bag/Given 02/19/2019 1717)  vancomycin (VANCOCIN) IVPB 1000 mg/200 mL premix (has no administration in time range)  ceFEPIme (MAXIPIME) 2 g in sodium chloride 0.9 % 100 mL IVPB (has no administration in time range)  sodium chloride 0.9 % bolus 1,000 mL (0 mLs Intravenous Stopped 02/28/2019 1706)  ceFEPIme (MAXIPIME) 2 g in sodium chloride 0.9 % 100 mL IVPB (0 g Intravenous Stopped 02/16/2019 1715)  metroNIDAZOLE (FLAGYL) IVPB 500 mg (500 mg Intravenous New Bag/Given 02/10/2019 1707)    Note:  This document was prepared using Dragon voice recognition software and may include unintentional dictation errors.  Alona Bene, MD Emergency Medicine    Tatum Massman, Arlyss Repress, MD 02/26/2019 [phone]

## 2019-02-27 NOTE — Progress Notes (Signed)
Pharmacy Antibiotic Note  Noah Thomas is a 58 y.o. male admitted on 03-23-19 with sepsis.  Pharmacy has been consulted for vancomycin and cefepime dosing.  Plan: -Vancomycin 1500mg  IV x1 then 1000mg  IV q12h -Cefepime 2g IV x1 then 2g IV q12h   Height: 6' (182.9 cm) Weight: 180 lb (81.6 kg) IBW/kg (Calculated) : 77.6  Temp (24hrs), Avg:98.2 F (36.8 C), Min:98.2 F (36.8 C), Max:98.2 F (36.8 C)  Recent Labs  Lab 2019-03-23 1450  WBC 14.6*  LATICACIDVEN 2.3*    CrCl cannot be calculated (No successful lab value found.).    No Known Allergies  Antimicrobials this admission: Vancomycin 3/21 >>  Cefepime 3/21 >>   Dose adjustments this admission: none  Microbiology results: none  Thank you for allowing pharmacy to be a part of this patient's care.  Fredonia Highland, PharmD, BCPS Clinical Pharmacist Please check AMION for all [facility] numbers 2019-03-23

## 2019-02-27 NOTE — ED Notes (Addendum)
Pt has removed his IV once again. Continuing to pull at BP cuff, pulse ox, pt armband, etc. Vancomycin infusion paused until new IV access can be established.

## 2019-02-27 NOTE — H&P (Addendum)
History and Physical    ETAI COPADO KPQ:[id number] DOB: [birth year] DOA: 03/07/2019  PCP: Patient, No Pcp Per   I have briefly reviewed patients previous medical reports in [facility].  Patient coming from: Home  Chief Complaint: Altered mental status  HPI: Noah Thomas is a 58 year old male, lives alone in a mobile home and independent, has not seen a physician in a long time, PMH of significant longstanding mental health issues, not on prescription medications, tobacco abuse, presented to [facility] ED on 03/01/2019 via EMS with altered mental status.  History obtained from brother.  Brother indicates that patient was normal until his teenage years when he started becoming paranoid and agitated while living with his parents, eventually had to be placed in [facility].  Over the years patient has calmed down, able to go to the grocery store for food, cigarettes but has no insight and unable to truly care for himself.  He lives in [place] conditions to an extent that when EMS went to retrieve him today, authorities were planning to "condemn" his trailer home.  Patient's brother has given him a debit card and provides with the finances.  Patient last visited his brother on [birth year], his birthday at which point he was in his usual state of health except he complained of some toe pain but was managing to ambulate.  Since then the brother was trying to reach him by phone for several days and went over to his house yesterday but did not get an answer at the door.  He also noticed decrease pending on the debit card that he had provided.  When he returned today he heard some soft noises in the house and called 911 and EMS found the patient on the ground in filthy condition, confused.  Patient is unable to provide any history.  He cannot tell me why he is in the hospital.  At one point he said he was here to get "checked".  ED Course: Noted to be in filthy condition, tachycardic, not febrile, not  hypotensive, leukocytosis, elevated lactate, right toe exam suggestive of possible cellulitis, initiated sepsis protocol and received IV fluids and broad-spectrum IV antibiotics.  Orthopedics consulted by EDP and will see him in the morning.  WBC 14.6, hemoglobin 18.4, glucose 211, BUN 33, AST 136, ALT 69, total bilirubin 1.5, CK 3000 323, troponin 0 0.03, urine microscopy not suggestive of UTI, chest x-ray negative, CT head suggests subacute to chronic ischemia, right foot x-ray showed comminuted fracture of the first distal phalanx.  Review of Systems:  Unable to review systems due to altered mental status.  History reviewed. No pertinent past medical history.  As per brother, patient has not been to a doctor in a long time and hence unknown.  History reviewed. No pertinent surgical history.  Not known.  Social History  reports that he has been smoking. He does not have any smokeless tobacco history on file. He reports current alcohol use. He reports previous drug use.  No Known Allergies  No family history on file.  Patient's parents are demised.  No other history available due to mental status changes.   Prior to Admission medications   Not on File  Patient not on prescription medications.  Physical Exam: Vitals:   02/20/2019 1630 02/16/2019 1700 02/15/2019 1715 02/26/2019 1815  BP: (!) 170/98 (!) 166/97 (!) 155/88   Pulse: (!) 109 (!) 102 (!) 104 (!) 105  Resp: '18 15 17 18  ' Temp:  TempSrc:      SpO2: 97% 96% 100% 96%  Weight:      Height:          Constitutional: Middle-age male, moderately built and poorly nourished, unkempt covered in dirt, small pieces of glass noted on right knee, multiple bruises, beard, unkept hair (braid)- as per brother has not cut his hair in a long time. Eyes: PERTLA, lids and conjunctivae normal ENMT: Mucous membranes are dry. Posterior pharynx clear of any exudate or lesions.  Missing multiple teeth and the left upper jaw teeth with caries Neck:  supple, no masses, no thyromegaly Respiratory: clear to auscultation bilaterally, no wheezing, no crackles. Normal respiratory effort. No accessory muscle use.  Cardiovascular: S1 & S2 heard, regular rate and rhythm, no murmurs / rubs / gallops. No extremity edema. 2+ pedal pulses. No carotid bruits.  Abdomen: No distension, no tenderness, no masses palpated. No hepatosplenomegaly. Bowel sounds normal.  Musculoskeletal: no clubbing / cyanosis. No joint deformity upper and lower extremities. Good ROM, no contractures. Normal muscle tone.  Skin: Right knee with superficial wound which appears old, dry, no acute findings.  Some bruising over his left knee.  There is also a superficial skin tear over his left flank and patchy redness over his back.  Right first toe with black discoloration, toenail seems to have avulsed partially, redness around the black area extending to the forefoot, slightly warm and tender.  Feeble dorsalis pedis and posterior tibial pulsations.  Good right femoral pulsations.  Foot itself is warm and perfused. Please see pic below from admission Neurologic: CN 2-12 grossly intact. Sensation intact, DTR normal. Strength 5/5 in all 4 limbs.  Psychiatric: Normal judgment and insight. Alert and oriented x self, place, not to time (04/26/2022) and not to president Wynetta Emery).  Flat affect.        Labs on Admission: I have personally reviewed following labs and imaging studies  CBC: Recent Labs  Lab 03/03/2019 1450  WBC 14.6*  NEUTROABS 10.9*  HGB 18.4*  HCT 55.4*  MCV 88.4  PLT 021   Basic Metabolic Panel: Recent Labs  Lab 02/11/2019 1450  NA 141  K 4.1  CL 101  CO2 23  GLUCOSE 211*  BUN 33*  CREATININE 1.11  CALCIUM 9.7   Liver Function Tests: Recent Labs  Lab 02/25/2019 1450  AST 136*  ALT 69*  ALKPHOS 98  BILITOT 1.5*  PROT 7.5  ALBUMIN 3.7   Cardiac Enzymes: Recent Labs  Lab 02/15/2019 1450  CKTOTAL 3,323*  TROPONINI 0.03*   CBG: Recent Labs  Lab  03/01/2019 1445  GLUCAP 233*   Urine analysis:    Component Value Date/Time   COLORURINE YELLOW 02/16/2019 [place] 02/14/2019 1604   LABSPEC 1.026 02/10/2019 [place] 5.0 02/18/2019 1604   GLUCOSEU 150 (A) 02/26/2019 1604   HGBUR SMALL (A) 02/25/2019 1604   BILIRUBINUR NEGATIVE 02/13/2019 1604   KETONESUR 20 (A) 02/18/2019 1604   PROTEINUR NEGATIVE 02/23/2019 1604   NITRITE NEGATIVE 02/16/2019 1604   LEUKOCYTESUR NEGATIVE 02/07/2019 1604     Radiological Exams on Admission: Dg Chest 2 View  Result Date: 02/14/2019 CLINICAL DATA:  Altered mental status today. EXAM: CHEST - 2 VIEW COMPARISON:  Single-view of the chest 07/16/2006. FINDINGS: Lungs clear. Heart size normal. No pneumothorax or pleural fluid. No acute or focal bony abnormality. IMPRESSION: Negative chest. Electronically Signed   By: Inge Rise M.D.   On: 02/17/2019 16:00   Dg Knee 2  Views Right  Result Date: 03/04/2019 CLINICAL DATA:  Knee pain, initial encounter EXAM: RIGHT KNEE - 2 VIEW COMPARISON:  None. FINDINGS: Mild medial joint space narrowing is noted. No acute fracture or dislocation is seen. Mild patellofemoral spurring is noted as well. IMPRESSION: Mild degenerative change without acute abnormality. Electronically Signed   By: Inez Catalina M.D.   On: 03/05/2019 16:03   Ct Head Wo Contrast  Result Date: 02/21/2019 CLINICAL DATA:  Altered level of consciousness EXAM: CT HEAD WITHOUT CONTRAST TECHNIQUE: Contiguous axial images were obtained from the base of the skull through the vertex without intravenous contrast. COMPARISON:  None. FINDINGS: Brain: Mild atrophic changes are noted. Encephalomalacia changes are seen in the distribution of the right middle cerebral artery consistent with prior infarct. Rounded decreased area of attenuation is noted in the region of the internal capsule on the left suggestive of subacute to chronic ischemia. No focal area of acute infarct or acute hemorrhage  is seen. No space-occupying mass lesion is noted. Vascular: No hyperdense vessel or unexpected calcification. Skull: Normal. Negative for fracture or focal lesion. Sinuses/Orbits: No acute finding. Other: None. IMPRESSION: Chronic atrophic changes. 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 Rounded somewhat elongated area of decreased attenuation on the left in the region of the internal capsule and basal ganglia consistent with subacute to chronic ischemia. No acute infarct is noted. Electronically Signed   By: Inez Catalina M.D.   On: 02/25/2019 16:06   Dg Foot Complete Right  Result Date: 03/07/2019 CLINICAL DATA:  Right foot pain EXAM: RIGHT FOOT COMPLETE - 3+ VIEW COMPARISON:  None. FINDINGS: Comminuted fracture is noted at the base of the first distal phalanx. This extends into the articular surface in multiple locations. No other fracture is seen. Soft tissue swelling is noted. IMPRESSION: Comminuted fracture of the first distal phalanx which extends to the articular surface. Electronically Signed   By: Inez Catalina M.D.   On: 02/17/2019 16:01    EKG: Independently reviewed.  Sinus tachycardia at 112 bpm, normal axis, no acute changes.  QTc 514 ms.  Assessment/Plan Active Problems:   Acute metabolic encephalopathy   Toe fracture, right 1st with cellulitis and?  Gangrene versus hematoma.   Rhabdomyolysis   Dehydration   Cellulitis   Sepsis ([place])   Adult failure to thrive     1. Acute metabolic encephalopathy: Likely related to dehydration, hyperglycemia, sepsis complicating underlying mental health issues.  CT head concerning for subacute/chronic ischemia.  No focal neurological deficits.  TSH and ammonia normal.  Hydrate with IV fluids, treat for sepsis, follow-up MRI brain.  Check UDS although brother is quite confident that patient does not abuse drugs.  He also indicated that patient only drinks socially.  Thiamine, folate and multivitamins.  Improved compared to initial  arrival to ED.  Monitor closely. 2. Sepsis due to right foot cellulitis: Met sepsis criteria on admission.  Treat with IV fluids, continue empirically started IV cefepime and vancomycin. 3. Dehydration: Secondary to poor oral intake.  IV fluids.  Oral fluids. 4. Rhabdomyolysis: Secondary to laying on the floor for undetermined amount of time.  Patient has multiple superficial wounds on his body.  IV fluids.  Follow CK and a.m.  Minimally elevated troponin likely related to rhabdomyolysis.  Trend troponins. 5. Comminuted fracture of the first distal phalanx: EDP consulted orthopedics and will see in a.m. 6. Hyperglycemia/suspect DM2: Check A1c.  SSI. 7. Possible hypertension: PRN IV hydralazine for now.  If persists then consider  maintenance medications. 8. Mild transaminitis: Likely related to rhabdomyolysis.  Follow CMP in a.m. 9. Behavioral health issues: Since his teenage years, no known diagnosis.  May consider psychiatric consultation. 10. Prolonged QTC: Unclear etiology.  Potassium 4.1.  Check magnesium.  Avoid QT prolonging medications. 11. Multiple cuts and bruises: esp R kneeWound care consultation. 12. Suspected fall: Seen on floor at home.  PT and OT evaluation. 35. Homeless:  social work [place].   DVT prophylaxis: Lovenox Code Status: Full.  Confirmed with patient's brother.  Patient does not have a legal guardian or power of attorney. Family Communication: Discussed in detail with patient's brother, updated care and answered questions. Disposition Plan: To be determined.  Patient is deemed homeless.  Clinical social work consulted. Consults called: Orthopedics/Dr. Sharol Given supposed to see 3/20 Admission status: Inpatient, telemetry.   Severity of Illness: The appropriate patient status for this patient is INPATIENT. Inpatient status is judged to be reasonable and necessary in order to provide the required intensity of service to ensure the patient's safety. The patient's  presenting symptoms, physical exam findings, and initial radiographic and laboratory data in the context of their chronic comorbidities is felt to place them at high risk for further clinical deterioration. Furthermore, it is not anticipated that the patient will be medically stable for discharge from the hospital within 2 midnights of admission. The following factors support the patient status of inpatient.   " The patient's presenting symptoms include altered mental status, found on the floor at home in filthy conditions. " The worrisome physical exam findings include dehydration, multiple superficial cuts and bruises, right first toe with hematoma versus gangrene and cellulitis, confusion with altered mental status. " The initial radiographic and laboratory data are worrisome because of right first toe fracture, CT head concerning for subacute versus chronic ischemia. " The chronic co-morbidities include significant chronic mental health issues.   * I certify that at the point of admission it is my clinical judgment that the patient will require inpatient hospital care spanning beyond 2 midnights from the point of admission due to high intensity of service, high risk for further deterioration and high frequency of surveillance required.Vernell Leep MD [facility]  To contact the attending provider between 7A-7P or the covering provider during after hours 7P-7A, please log into the web site [email or web address] and access using universal [place] password for that web site. If you do not have the password, please call the hospital operator.  03/04/2019, 6:26 PM

## 2019-02-28 ENCOUNTER — Inpatient Hospital Stay (HOSPITAL_COMMUNITY): Payer: Self-pay

## 2019-02-28 DIAGNOSIS — G9341 Metabolic encephalopathy: Secondary | ICD-10-CM

## 2019-02-28 DIAGNOSIS — I639 Cerebral infarction, unspecified: Secondary | ICD-10-CM

## 2019-02-28 DIAGNOSIS — T796XXA Traumatic ischemia of muscle, initial encounter: Secondary | ICD-10-CM

## 2019-02-28 DIAGNOSIS — M869 Osteomyelitis, unspecified: Secondary | ICD-10-CM

## 2019-02-28 DIAGNOSIS — R400 Somnolence: Secondary | ICD-10-CM | POA: Diagnosis present

## 2019-02-28 DIAGNOSIS — E440 Moderate protein-calorie malnutrition: Secondary | ICD-10-CM

## 2019-02-28 DIAGNOSIS — I96 Gangrene, not elsewhere classified: Secondary | ICD-10-CM | POA: Diagnosis present

## 2019-02-28 DIAGNOSIS — S92424B Nondisplaced fracture of distal phalanx of right great toe, initial encounter for open fracture: Secondary | ICD-10-CM

## 2019-02-28 DIAGNOSIS — F17200 Nicotine dependence, unspecified, uncomplicated: Secondary | ICD-10-CM

## 2019-02-28 LAB — URINE CULTURE
CULTURE: NO GROWTH
Special Requests: NORMAL

## 2019-02-28 LAB — COMPREHENSIVE METABOLIC PANEL
ALBUMIN: 3.1 g/dL — AB (ref 3.5–5.0)
ALT: 67 U/L — AB (ref 0–44)
AST: 112 U/L — ABNORMAL HIGH (ref 15–41)
Alkaline Phosphatase: 79 U/L (ref 38–126)
Anion gap: 13 (ref 5–15)
BUN: 25 mg/dL — ABNORMAL HIGH (ref 6–20)
CO2: 23 mmol/L (ref 22–32)
Calcium: 8.9 mg/dL (ref 8.9–10.3)
Chloride: 105 mmol/L (ref 98–111)
Creatinine, Ser: 1.15 mg/dL (ref 0.61–1.24)
GFR calc Af Amer: >60 mL/min (ref >60)
GFR calc non Af Amer: >60 mL/min (ref >60)
GLUCOSE: 213 mg/dL — AB (ref 70–99)
Potassium: 3.8 mmol/L (ref 3.5–5.1)
Sodium: 141 mmol/L (ref 135–145)
Total Bilirubin: 0.9 mg/dL (ref 0.3–1.2)
Total Protein: 6.2 g/dL — ABNORMAL LOW (ref 6.5–8.1)

## 2019-02-28 LAB — CBC
HCT: 49.3 % (ref 39.0–52.0)
HEMOGLOBIN: 16.8 g/dL (ref 13.0–17.0)
MCH: 30.2 pg (ref 26.0–34.0)
MCHC: 34.1 g/dL (ref 30.0–36.0)
MCV: 88.5 fL (ref 80.0–100.0)
Platelets: 274 10*3/uL (ref 150–400)
RBC: 5.57 MIL/uL (ref 4.22–5.81)
RDW: 12.9 % (ref 11.5–15.5)
WBC: 13.9 10*3/uL — ABNORMAL HIGH (ref 4.0–10.5)
nRBC: 0 % (ref 0.0–0.2)

## 2019-02-28 LAB — CK: Total CK: 2223 U/L — ABNORMAL HIGH (ref 49–397)

## 2019-02-28 LAB — GLUCOSE, CAPILLARY
GLUCOSE-CAPILLARY: 127 mg/dL — AB (ref 70–99)
Glucose-Capillary: 350 mg/dL — ABNORMAL HIGH (ref 70–99)

## 2019-02-28 LAB — TROPONIN I
Troponin I: 0.06 ng/mL (ref <0.03)
Troponin I: 0.03 ng/mL (ref <0.03)

## 2019-02-28 LAB — HIV ANTIBODY (ROUTINE TESTING W REFLEX): HIV Screen 4th Generation wRfx: NONREACTIVE

## 2019-02-28 MED ORDER — VANCOMYCIN HCL 1000 MG IV SOLR
1000.0000 mg | Freq: Two times a day (BID) | INTRAVENOUS | Status: AC
  Administered 2019-02-28 – 2019-03-02 (×4): 1000 mg via INTRAVENOUS
  Filled 2019-02-28 (×5): qty 1000

## 2019-02-28 MED ORDER — HALOPERIDOL LACTATE 5 MG/ML IJ SOLN
2.5000 mg | Freq: Once | INTRAMUSCULAR | Status: AC
  Administered 2019-02-28: 2.5 mg via INTRAVENOUS
  Filled 2019-02-28: qty 1

## 2019-02-28 MED ORDER — IOPAMIDOL (ISOVUE-370) INJECTION 76%
75.0000 mL | Freq: Once | INTRAVENOUS | Status: AC | PRN
  Administered 2019-02-28: 75 mL via INTRAVENOUS

## 2019-02-28 MED ORDER — ASPIRIN EC 81 MG PO TBEC
81.0000 mg | DELAYED_RELEASE_TABLET | Freq: Every day | ORAL | Status: DC
  Administered 2019-02-28 – 2019-03-08 (×8): 81 mg via ORAL
  Filled 2019-02-28 (×9): qty 1

## 2019-02-28 MED ORDER — HYDRALAZINE HCL 20 MG/ML IJ SOLN
10.0000 mg | Freq: Four times a day (QID) | INTRAMUSCULAR | Status: DC | PRN
  Administered 2019-02-28: 10 mg via INTRAVENOUS
  Filled 2019-02-28: qty 1

## 2019-02-28 MED ORDER — IOPAMIDOL (ISOVUE-370) INJECTION 76%
INTRAVENOUS | Status: AC
  Filled 2019-02-28: qty 100

## 2019-02-28 NOTE — Consult Note (Signed)
ORTHOPAEDIC CONSULTATION  REQUESTING PHYSICIAN: Alwyn Ren, MD  Chief Complaint: Gangrene and osteomyelitis right great toe.  HPI: Noah Thomas is a 58 y.o. male who presents with peripheral vascular disease history of smoking with osteomyelitis ulceration and gangrene of the right great toe.  History reviewed. No pertinent past medical history. History reviewed. No pertinent surgical history. Social History   Socioeconomic History   Marital status: Single    Spouse name: Not on file   Number of children: Not on file   Years of education: Not on file   Highest education level: Not on file  Occupational History   Not on file  Social Needs   Financial resource strain: Not on file   Food insecurity:    Worry: Not on file    Inability: Not on file   Transportation needs:    Medical: Not on file    Non-medical: Not on file  Tobacco Use   Smoking status: Current Every Day Smoker  Substance and Sexual Activity   Alcohol use: Yes    Comment: Not much as per brother.   Drug use: Not Currently   Sexual activity: Not on file  Lifestyle   Physical activity:    Days per week: Not on file    Minutes per session: Not on file   Stress: Not on file  Relationships   Social connections:    Talks on phone: Not on file    Gets together: Not on file    Attends religious service: Not on file    Active member of club or organization: Not on file    Attends meetings of clubs or organizations: Not on file    Relationship status: Not on file  Other Topics Concern   Not on file  Social History Narrative   Not on file   No family history on file. - negative except otherwise stated in the family history section No Known Allergies Prior to Admission medications   Not on File   Dg Chest 2 View  Result Date: 02/19/2019 CLINICAL DATA:  Altered mental status today. EXAM: CHEST - 2 VIEW COMPARISON:  Single-view of the chest 07/16/2006. FINDINGS: Lungs clear.  Heart size normal. No pneumothorax or pleural fluid. No acute or focal bony abnormality. IMPRESSION: Negative chest. Electronically Signed   By: Drusilla Kanner M.D.   On: 02/19/2019 16:00   Dg Knee 2 Views Right  Result Date: 02/10/2019 CLINICAL DATA:  Knee pain, initial encounter EXAM: RIGHT KNEE - 2 VIEW COMPARISON:  None. FINDINGS: Mild medial joint space narrowing is noted. No acute fracture or dislocation is seen. Mild patellofemoral spurring is noted as well. IMPRESSION: Mild degenerative change without acute abnormality. Electronically Signed   By: Alcide Clever M.D.   On: 02/18/2019 16:03   Ct Head Wo Contrast  Result Date: 02/24/2019 CLINICAL DATA:  Altered level of consciousness EXAM: CT HEAD WITHOUT CONTRAST TECHNIQUE: Contiguous axial images were obtained from the base of the skull through the vertex without intravenous contrast. COMPARISON:  None. FINDINGS: Brain: Mild atrophic changes are noted. Encephalomalacia changes are seen in the distribution of the right middle cerebral artery consistent with prior infarct. Rounded decreased area of attenuation is noted in the region of the internal capsule on the left suggestive of subacute to chronic ischemia. No focal area of acute infarct or acute hemorrhage is seen. No space-occupying mass lesion is noted. Vascular: No hyperdense vessel or unexpected calcification. Skull: Normal. Negative for fracture or focal lesion.  Sinuses/Orbits: No acute finding. Other: None. IMPRESSION: Chronic atrophic changes. 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 Rounded somewhat elongated area of decreased attenuation on the left in the region of the internal capsule and basal ganglia consistent with subacute to chronic ischemia. No acute infarct is noted. Electronically Signed   By: Alcide Clever M.D.   On: 03/01/2019 16:06   [name]  Result Date: 03/01/2019 CLINICAL DATA:  Focal neuro deficit for greater than 6 hours. Altered level  of consciousness. EXAM: MRI HEAD WITHOUT CONTRAST TECHNIQUE: Multiplanar, multiecho pulse sequences of the brain and surrounding structures were obtained without intravenous contrast. COMPARISON:  CT head without contrast 01/29/2019 FINDINGS: Brain: The diffusion-weighted images confirm an acute nonhemorrhagic infarct involving the genu of the left internal capsule and globus pallidus. T2 signal changes are associated with the acute infarct, consistent with the subacute time frame. Remote encephalomalacia is again noted right MCA territory. The ventricles are of proportionate to the degree of atrophy. Wallerian degeneration is present in the right cerebral peduncle extending into the pons. Cerebellum is normal. No significant extraaxial fluid collection is present. Vascular: Flow is present in the major intracranial arteries. Skull and upper cervical spine: The craniocervical junction is normal. Upper cervical spine is within normal limits. Marrow signal is unremarkable. Sinuses/Orbits: The paranasal sinuses and mastoid air cells are clear. Bilateral lens replacements are noted. The globes and orbits are within normal limits. IMPRESSION: 1. 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 This is consistent with a time frame of greater than 6 hours. 2. 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 Electronically Signed   By: Marin Roberts M.D.   On: 02/16/2019 19:18   Dg Foot Complete Right  Result Date: 02/16/2019 CLINICAL DATA:  Right foot pain EXAM: RIGHT FOOT COMPLETE - 3+ VIEW COMPARISON:  None. FINDINGS: Comminuted fracture is noted at the base of the first distal phalanx. This extends into the articular surface in multiple locations. No other fracture is seen. Soft tissue swelling is noted. IMPRESSION: Comminuted fracture of the first distal phalanx which extends to the articular surface. Electronically Signed    By: Alcide Clever M.D.   On: 03/09/2019 16:01   - pertinent xrays, CT, MRI studies were reviewed and independently interpreted  Positive ROS: All other systems have been reviewed and were otherwise negative with the exception of those mentioned in the HPI and as above.  Physical Exam: General: Alert, no acute distress Psychiatric: Patient is competent for consent with normal mood and affect Lymphatic: No axillary or cervical lymphadenopathy Cardiovascular: No pedal edema Respiratory: No cyanosis, no use of accessory musculature GI: No organomegaly, abdomen is soft and non-tender    Images:  @ENCIMAGES @  Labs:  Lab Results  Component Value Date   HGBA1C 7.7 (H) 02/08/2019    Lab Results  Component Value Date   ALBUMIN 3.1 (L) 02/28/2019   ALBUMIN 3.7 03/04/2019    Neurologic: Patient does not have protective sensation bilateral lower extremities.   MUSCULOSKELETAL:   Skin: Examination patient has black gangrenous changes of the right great toe there is ulceration with exposed bone.  Radiographs shows destruction of the tuft of the great toe consistent with osteomyelitis.  Patient has a weakly palpable dorsalis pedis and posterior tibial pulse he also has a weak popliteal pulse he has a strong femoral pulse.  Patient has multiple abrasions on his back and lower extremities.  Assessment: Assessment: Peripheral vascular  disease with gangrene of the right great toe.  Plan: The first step would be to proceed with a right foot first ray amputation.  If patient's circulation is insufficient for healing would need to obtain a vascular consult.  I have ordered ankle-brachial indices for Monday.  Thank you for the consult and the opportunity to see Mr. Dajuan Elvin, MD [facility] [phone] 9:41 AM

## 2019-02-28 NOTE — Evaluation (Signed)
Clinical/Bedside Swallow Evaluation Patient Details  Name: Noah Thomas MRN: [id number] Date of Birth: 1960-12-28  Today's Date: 02/28/2019 Time: SLP Start Time (ACUTE ONLY): 1218 SLP Stop Time (ACUTE ONLY): 1230 SLP Time Calculation (min) (ACUTE ONLY): 12 min  Past Medical History: History reviewed. No pertinent past medical history. Past Surgical History: History reviewed. No pertinent surgical history. HPI:  58 year old male, lives alone in a mobile home and independent, has not seen a physician in a long time, PMH of significant longstanding mental health issues, not on prescription medications, tobacco abuse, presented to [facility] ED on 02/21/2019 via EMS with altered mental status.  History obtained from brother.  Brother indicates that patient was normal until his teenage years when he started becoming paranoid and agitated while living with his parents, eventually had to be placed in [facility].  Over the years patient has calmed down, able to go to the grocery store for food, cigarettes but has no insight and unable to truly care for himself.  He lives in [place] conditions to an extent that when EMS went to retrieve him today, authorities were planning to "condemn" his trailer home.  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   Assessment / Plan / Recommendation Clinical Impression  Pt participated in clinical swallow assessment, followed simple commands, oriented to person only.  Limited spontaneous verbal output. He demonstrated no focal CN deficits; masticated solids well despite poor condition of teeth.  Consumed thin liquids well with no overt s/s of aspiration.  No concerns for dysphagia.  Recommend continuing current regular diet with thin liquids.  Pt  may benefit from cognitive/linguistic evaluation given acute neuro diagnosis.  Otherwise, no SLP f/u for swallowing is warranted.  SLP Visit Diagnosis: Dysphagia, unspecified (R13.10)    Aspiration Risk  No limitations    Diet Recommendation   continue regular solids, thin liquids  Medication Administration: Whole meds with liquid    Other  Recommendations Oral Care Recommendations: Oral care BID   Follow up Recommendations None      Frequency and Duration            Prognosis        Swallow Study   General Date of Onset: 02/15/2019 HPI: 58 year old male, lives alone in a mobile home and independent, has not seen a physician in a long time, PMH of significant longstanding mental health issues, not on prescription medications, tobacco abuse, presented to [facility] ED on 02/18/2019 via EMS with altered mental status.  History obtained from brother.  Brother indicates that patient was normal until his teenage years when he started becoming paranoid and agitated while living with his parents, eventually had to be placed in [facility].  Over the years patient has calmed down, able to go to the grocery store for food, cigarettes but has no insight and unable to truly care for himself.  He lives in [place] conditions to an extent that when EMS went to retrieve him today, authorities were planning to "condemn" his trailer home.  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 Type of Study: Bedside Swallow Evaluation Previous Swallow Assessment: no Diet Prior to this Study: Regular;Thin  liquids Temperature Spikes Noted: No Respiratory Status: Room air History of Recent Intubation: No Behavior/Cognition: Alert Oral Cavity Assessment: Within Functional Limits Oral Care  Completed by SLP: No Oral Cavity - Dentition: Poor condition;Missing dentition Self-Feeding Abilities: Able to feed self Patient Positioning: Upright in chair Baseline Vocal Quality: Normal Volitional Cough: Strong Volitional Swallow: Able to elicit    Oral/Motor/Sensory Function Overall Oral Motor/Sensory Function: Within functional limits   Ice Chips Ice chips: Within functional limits   Thin Liquid Thin Liquid: Within functional limits    Nectar Thick Nectar Thick Liquid: Not tested   Honey Thick Honey Thick Liquid: Not tested   Puree Puree: Within functional limits   Solid     Solid: Within functional limits      Blenda Mounts Laurice 02/28/2019,12:31 PM   Marchelle Folks L. Samson Frederic, MA CCC/SLP Acute Rehabilitation Services Office number [phone] Pager [phone]

## 2019-02-28 NOTE — Consult Note (Signed)
WOC Nurse wound consult note Reason for Consult:multiple wounds, gangrene of RGT, moisture associated skin damage Wound type: infectious, pressure, MASD Pressure Injury POA: Yes Measurement: Right knee full thickness wounds:  5cm x 3.5cm with depth obscured by the presence of dried serum. Dry. Periwound erythema. RGT: Black/purple, no drainage. Right heel:  5x5cm Stage 1 non blanching erythema Left Heel: 5x5cm area of high risk, blanchable erythema Right elbow: 2cm x 1cm with 100% yellow wound bed. Sacrum and buttocks: intact with areas of moisture associated skin damage and partial thickness lesions (MASD) Left thoracic: 7cm x 3cm area of erythema with two open areas within, larger measures 2cm x 1cm with black dried serum obscuring wound depth. Wound bed:As noted above Drainage (amount, consistency, odor) As noted above Periwound:As noted above Dressing procedure/placement/frequency:  Dr. Lajoyce Corners to amputate RGT on Tuesday. Will place mattress replacement on low frame for safety, provide bilateral Pressure redistribution heel boots. Nursing is provided guidance for topical care and for care of MASD via the orders.  WOC nursing team will not follow, but will remain available to this patient, the nursing and medical teams.  Please re-consult if needed. Thanks, Ladona Mow, MSN, RN, GNP, [place]  Pager# [phone]

## 2019-02-28 NOTE — Progress Notes (Signed)
Notified by page on call phys of bp 187/106- awaiting new orders

## 2019-02-28 NOTE — Progress Notes (Signed)
Spoke with pt RN regarding consult placed for piv placement. Pt has pulled out 4 pivs within 24hrs. Pt has very poor veins and is a difficult stick. Suggested CL to be placed by MD so sutures could be used given above info. RN to notify MD.  Gilman Schmidt, RN VAST

## 2019-02-28 NOTE — Evaluation (Signed)
Physical Therapy Evaluation Patient Details Name: Noah Thomas MRN: [id number] DOB: 07/29/1961 Today's Date: 02/28/2019   History of Present Illness  58 year old male, lives alone in a mobile home and independent, has not seen a physician in a long time, PMH of significant longstanding mental health issues, not on prescription medications, tobacco abuse, presented to [facility] ED on 02/25/2019 via EMS with altered mental status.  History obtained from brother.  Brother indicates that patient was normal until his teenage years when he started becoming paranoid and agitated while living with his parents, eventually had to be placed in [facility].  Over the years patient has calmed down, able to go to the grocery store for food, cigarettes but has no insight and unable to truly care for himself.  He lives in [place] conditions to an extent that when EMS went to retrieve him today, authorities were planning to "condemn" his trailer home.  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     Clinical Impression  Pt admitted with above diagnosis. Pt currently with functional limitations due to the deficits listed below (see PT Problem List). PTA pt lived alone independent. On eval, he required min assist bed mobility, min assist transfers and min guard assist ambulation 180 feet with RW. Pt is currently under consultation with Dr. Lajoyce Corners for possible R 1st ray amputation. Pt will benefit from skilled PT to increase their independence and safety with mobility to allow discharge to the venue listed below.       Follow Up Recommendations SNF    Equipment Recommendations  Other (comment)(defer to next venue)    Recommendations for Other Services        Precautions / Restrictions Precautions Precautions: Fall;Other (comment) Precaution Comments: R 1st phalanx fx (pending possible 1st ray amp) Restrictions Weight Bearing Restrictions: No      Mobility  Bed Mobility Overal bed mobility: Needs Assistance Bed Mobility: Supine to Sit     Supine to sit: HOB elevated;Min assist     General bed mobility comments: +rail  Transfers Overall transfer level: Needs assistance Equipment used: Ambulation equipment used Transfers: Sit to/from [employer] Sit to Stand: Min assist Stand pivot transfers: Min assist       General transfer comment: assist to power up  Ambulation/Gait Ambulation/Gait assistance: Min guard Gait Distance (Feet): 180 Feet Assistive device: Rolling walker (2 wheeled) Gait Pattern/deviations: Step-through pattern;Decreased stride length;Antalgic Gait velocity: decreased Gait velocity interpretation: <1.31 ft/sec, indicative of household ambulator General Gait Details: Pt reports no pain due to R toe fx but presents with mild antalgic gait.   Stairs            Wheelchair Mobility    Modified Rankin (Stroke Patients Only)       Balance Overall balance assessment: Needs assistance Sitting-balance support: No upper extremity supported;Feet supported Sitting balance-Leahy Scale: Good     Standing balance support: No upper extremity supported;During functional activity Standing balance-Leahy Scale: Fair Standing balance comment: amb in room without AD min guard assist. RW needed for increased distances.                              Pertinent Vitals/Pain Pain Assessment: No/denies pain    Home Living Family/patient expects  to be discharged to:: Shelter/Homeless                 Additional Comments: essentially homeless due to mobile home being condemned.     Prior Function Level of Independence: Independent               Hand Dominance         Extremity/Trunk Assessment   Upper Extremity Assessment Upper Extremity Assessment: Defer to OT evaluation    Lower Extremity Assessment Lower Extremity Assessment: Generalized weakness(appears symmetrical. Decreased coordination bilat. )    Cervical / Trunk Assessment Cervical / Trunk Assessment: Normal  Communication   Communication: No difficulties  Cognition Arousal/Alertness: Awake/alert Behavior During Therapy: Flat affect Overall Cognitive Status: No family/caregiver present to determine baseline cognitive functioning                                 General Comments: A&O x 3. Difficult to engage in conversation. Follows simple commands.      General Comments General comments (skin integrity, edema, etc.): sitter present in room    Exercises     Assessment/Plan    PT Assessment Patient needs continued PT services  PT Problem List Decreased strength;Decreased balance;Decreased cognition;Decreased mobility;Decreased knowledge of use of DME;Decreased activity tolerance;Decreased safety awareness       PT Treatment Interventions DME instruction;Functional mobility training;Balance training;Patient/family education;Gait training;Therapeutic activities;Therapeutic exercise;Cognitive remediation    PT Goals (Current goals can be found in the Care Plan section)  Acute Rehab PT Goals Patient Stated Goal: not stated PT Goal Formulation: With patient Time For Goal Achievement: 03/14/19 Potential to Achieve Goals: Good    Frequency Min 3X/week   Barriers to discharge        Co-evaluation               AM-PAC PT "6 Clicks" Mobility  Outcome Measure Help needed turning from your back to your side while in a flat bed without using bedrails?: None Help needed moving from lying on your back to sitting on the side of a flat bed without using bedrails?: A Little Help needed moving to and from a bed to a chair (including a wheelchair)?: A Little Help  needed standing up from a chair using your arms (e.g., wheelchair or bedside chair)?: A Little Help needed to walk in hospital room?: A Little Help needed climbing 3-5 steps with a railing? : A Lot 6 Click Score: 18    End of Session Equipment Utilized During Treatment: Gait belt Activity Tolerance: Patient tolerated treatment well Patient left: in chair;with call bell/phone within reach;with nursing/sitter in room;with chair alarm set Nurse Communication: Mobility status PT Visit Diagnosis: Other abnormalities of gait and mobility (R26.89);Difficulty in walking, not elsewhere classified (R26.2)    Time: 6812-7517 PT Time Calculation (min) (ACUTE ONLY): 27 min   Charges:   PT Evaluation $PT Eval Moderate Complexity: 1 Mod PT Treatments $Gait Training: 8-22 mins        Aida Raider, PT  Office # [phone] Pager [phone]   Ilda Foil 02/28/2019, 2:21 PM

## 2019-02-28 NOTE — Progress Notes (Signed)
Notified [facility] that patient continues to pull off telemetry box, pull off dressings, and pull at IV placement. Nursing will continue to monitor.

## 2019-02-28 NOTE — Progress Notes (Signed)
Telemetry pulled off so many times finally just put on hold

## 2019-02-28 NOTE — Plan of Care (Signed)
  Problem: Elimination: Goal: Will not experience complications related to bowel motility Outcome: Progressing Goal: Will not experience complications related to urinary retention Outcome: Progressing   Problem: Pain Managment: Goal: General experience of comfort will improve Outcome: Progressing   

## 2019-02-28 NOTE — Progress Notes (Signed)
bp on follow up after 10mg  hydralazine 181/95- heart rate 107

## 2019-02-28 NOTE — Consult Note (Addendum)
NEURO HOSPITALIST  CONSULT   Requesting Physician: Dr. Jerolyn CenterMathews    Chief Complaint: AMS  History obtained from:  Chart  HPI:                                                                                                                                         Shanon PayorJeffrey L Hooper is an 58 y.o. male with PMH of tobacco abuse, mental health issues who [facility] for AMS. Neurology consulted for stroke seen on MRI.   Per chart: patient was normal until his teenage years, and then he became paranoid and agitated while he lived with his parents. He currently lives in a mobile home alone, but can not truly take care of himself. Patient was last seen by his brother normal on march 13th, 2020. Since that time he has not seen him. He has been trying to reach him by phone, but has been unsuccessful. Brother manages his finances. Brother became worried when he went to the house and could hear that someone was in the trailer, but could not get an answer at the door. Ems was called, and they found the patient in filthy conditions confused.   hospital course:  02/19/2019 Afebrile, right toe with questionable cellulitis, sepsis protocol initiated. UTI negative. 3/22: ZOX:WRUEA/VWUJWJXBRI:acute/subacute infarct of left internal capsule. Remote encephalomalacia of the R MCA territory. CTH: no hemorrhage, but decreased attenuation on the left internal capsule  and BG region consistent with chronic ischemia. BP: 160/92 BG: 193 ammonia: WNL, TSH: WNL CK: 3323, ETOH: < 10  Date last known well: 02/19/2019 tPA Given: No: outside of window Modified Rankin: Rankin Score=2 NIHSS: 1; questions   History reviewed. No pertinent past medical history.  History reviewed. No pertinent surgical history.  No family history on file.       Social History:  reports that he has been smoking. He does not have any smokeless tobacco history on file. He reports current alcohol use. He  reports previous drug use.  Allergies: No Known Allergies  Medications:  Scheduled: . aspirin EC  81 mg Oral Daily  . enoxaparin (LOVENOX) injection  40 mg Subcutaneous Q24H  . folic acid  1 mg Oral Daily  . insulin aspart  0-9 Units Subcutaneous TID WC  . multivitamin with minerals  1 tablet Oral Daily  . nicotine  21 mg Transdermal Daily  . sodium chloride flush  3 mL Intravenous Q12H  . thiamine  100 mg Oral Daily   Continuous: . sodium chloride 100 mL/hr at 02/28/19 1225  . ceFEPime (MAXIPIME) IV 2 g (02/28/19 1228)  . banana bag IV 1000 mL    . vancomycin 1,000 mg (02/28/19 1049)   RCB:ULAGTXMIWOEHO **OR** acetaminophen, albuterol, hydrALAZINE, senna-docusate   ROS:                                                                                                                                        unobtainable from patient due to mental status and lack of cooperation   General Examination:                                                                                                      Blood pressure (!) 160/92, pulse (!) 110, temperature 98.1 F (36.7 C), temperature source Oral, resp. rate 16, height 6' (1.829 m), weight 81.6 kg, SpO2 96 %.  HEENT-  Normocephalic, no lesions, without obvious abnormality.  Normal external eye and conjunctiva. Cardiovascular-  pulses palpable throughout  Lungs- no excessive working breathing.  Saturations within normal limits on RA Musculoskeletal- right toe in gauze; plan for amputation Skin-warm and dry, several pressure relief dressings on BLE.  Neurological Examination Mental Status: Alert, oriented name and age/ says its [birth year] and 2060 .  Speech fluent without evidence of aphasia.  Able to follow  commands without difficulty though he does lack some cooperation at times. No dysarthria noted.  Cranial  Nerves: II: blinks to threat bilaterally,  III,IV, VI: ptosis not present, extra-ocular motions intact bilaterally, pupils equal, round, reactive to light and accommodation V,VII: Mild right facial weakness, facial light touch sensation normal bilaterally VIII: hearing normal bilaterally IX,X: uvula rises midline XI: bilateral shoulder shrug XII: midline tongue extension Motor: Right : Upper extremity   5/5 he has mild drift in his right upper extremity left:     Upper extremity   5/5  Lower extremity   5/5   Lower extremity   5/5 Tone and bulk:normal tone throughout; no atrophy noted Sensory: light touch intact throughout, bilaterally  Plantars: Right: downgoing   Left: downgoing Cerebellar: He has some difficulty with finger-nose-finger on the left more so than the right Gait: deferred   Lab Results: Basic Metabolic Panel: Recent Labs  Lab 02/12/2019 1450 02/28/19 0338  NA 141 141  K 4.1 3.8  CL 101 105  CO2 23 23  GLUCOSE 211* 213*  BUN 33* 25*  CREATININE 1.11 1.15  CALCIUM 9.7 8.9    CBC: Recent Labs  Lab 02/10/2019 1450 02/28/19 0338  WBC 14.6* 13.9*  NEUTROABS 10.9*  --   HGB 18.4* 16.8  HCT 55.4* 49.3  MCV 88.4 88.5  PLT 314 274    CBG: Recent Labs  Lab 02/08/2019 1445 03/01/2019 1933 02/28/19 1231  GLUCAP 233* 193* 350*    Imaging: Dg Chest 2 View  Result Date: 02/14/2019 CLINICAL DATA:  Altered mental status today. EXAM: CHEST - 2 VIEW COMPARISON:  Single-view of the chest 07/16/2006. FINDINGS: Lungs clear. Heart size normal. No pneumothorax or pleural fluid. No acute or focal bony abnormality. IMPRESSION: Negative chest. Electronically Signed   By: Drusilla Kanner M.D.   On: 02/18/2019 16:00   Dg Knee 2 Views Right  Result Date: 03/06/2019 CLINICAL DATA:  Knee pain, initial encounter EXAM: RIGHT KNEE - 2 VIEW COMPARISON:  None. FINDINGS: Mild medial joint space narrowing is noted. No acute fracture or dislocation is seen. Mild patellofemoral spurring  is noted as well. IMPRESSION: Mild degenerative change without acute abnormality. Electronically Signed   By: Alcide Clever M.D.   On: 02/26/2019 16:03   Ct Head Wo Contrast  Result Date: 02/11/2019 CLINICAL DATA:  Altered level of consciousness EXAM: CT HEAD WITHOUT CONTRAST TECHNIQUE: Contiguous axial images were obtained from the base of the skull through the vertex without intravenous contrast. COMPARISON:  None. FINDINGS: Brain: Mild atrophic changes are noted. Encephalomalacia changes are seen in the distribution of the right middle cerebral artery consistent with prior infarct. Rounded decreased area of attenuation is noted in the region of the internal capsule on the left suggestive of subacute to chronic ischemia. No focal area of acute infarct or acute hemorrhage is seen. No space-occupying mass lesion is noted. Vascular: No hyperdense vessel or unexpected calcification. Skull: Normal. Negative for fracture or focal lesion. Sinuses/Orbits: No acute finding. Other: None. IMPRESSION: Chronic atrophic changes. 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 Rounded somewhat elongated area of decreased attenuation on the left in the region of the internal capsule and basal ganglia consistent with subacute to chronic ischemia. No acute infarct is noted. Electronically Signed   By: Alcide Clever M.D.   On: 03/06/2019 16:06   [name]  Result Date: 02/16/2019 CLINICAL DATA:  Focal neuro deficit for greater than 6 hours. Altered level of consciousness. EXAM: MRI HEAD WITHOUT CONTRAST TECHNIQUE: Multiplanar, multiecho pulse sequences of the brain and surrounding structures were obtained without intravenous contrast. COMPARISON:  CT head without contrast 01/29/2019 FINDINGS: Brain: The diffusion-weighted images confirm an acute nonhemorrhagic infarct involving the genu of the left internal capsule and globus pallidus. T2 signal changes are associated with the acute infarct, consistent  with the subacute time frame. Remote encephalomalacia is again noted right MCA territory. The ventricles are of proportionate to the degree of atrophy. Wallerian degeneration is present in the right cerebral peduncle extending into the pons. Cerebellum is normal. No significant extraaxial fluid collection is present. Vascular: Flow is present in the major intracranial arteries. Skull and upper cervical spine: The craniocervical junction is normal. Upper  cervical spine is within normal limits. Marrow signal is unremarkable. Sinuses/Orbits: The paranasal sinuses and mastoid air cells are clear. Bilateral lens replacements are noted. The globes and orbits are within normal limits. IMPRESSION: 1. 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 This is consistent with a time frame of greater than 6 hours. 2. 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 Electronically Signed   By: Marin Roberts M.D.   On: 03/04/2019 19:18   Dg Foot Complete Right  Result Date: 02/14/2019 CLINICAL DATA:  Right foot pain EXAM: RIGHT FOOT COMPLETE - 3+ VIEW COMPARISON:  None. FINDINGS: Comminuted fracture is noted at the base of the first distal phalanx. This extends into the articular surface in multiple locations. No other fracture is seen. Soft tissue swelling is noted. IMPRESSION: Comminuted fracture of the first distal phalanx which extends to the articular surface. Electronically Signed   By: Alcide Clever M.D.   On: 02/22/2019 16:01     Valentina Lucks, MSN, NP-C [facility] [phone]  02/28/2019, 1:51 PM   Attending physician note to follow with Assessment and plan . I have seen the patient reviewed the above note.  He presented with altered mental status and mild right-sided weakness.  MRI reveals subcortical stroke.  Assessment: 58 y.o. male male with PMH of tobacco abuse, mental health issues who  presented to [facility] 02/09/2019 for AMS. Neurology consulted for stroke seen on MRI. CTH: no hemorrhage TPA not give due to presenting outside of the window.  Stroke Risk Factors - smoking  I suspect that this likely represents small vessel disease and he is undergoing evaluation for such.   Recommendations: -- BP goal : Permissive HTN upto 220/110 mmHg then gradually decrease to normotensive goal.  --MRI Brain  --CTA  --Echocardiogram --ASA -- High intensity Statin if LDL > 70 -- HgbA1c, fasting lipid panel -- PT consult, OT consult, Speech consult --Telemetry monitoring --Frequent neuro checks --Stroke swallow screen  --please page stroke NP  Or  PA  Or MD from 8am -4 pm  as this patient from this time will be  followed by the stroke.   You can look them up on [email or web address]  Password [name]  Ritta Slot, MD [facility] [phone]  If 7pm- 7am, please page neurology on call as listed in AMION.

## 2019-02-28 NOTE — Progress Notes (Signed)
puled out 4th iv- despite being wrapped with gauze- and mittens. Iv team states they will not attempt anymore till phys notified

## 2019-02-28 NOTE — Progress Notes (Addendum)
PROGRESS NOTE    Noah PayorJeffrey L Thomas  WUJ:[id number] DOB: March 27, 1961 DOA: 02/16/2019 PCP: Patient, No Pcp Per  Brief Narrative:58 year old male, lives alone in a mobile home and independent, has not seen a physician in a long time, PMH of significant longstanding mental health issues, not on prescription medications, tobacco abuse, [facility] ED on [facility]/19/2019 via EMS with altered mental status.  History obtained from brother.  Brother indicates that patient was normal until his teenage years when he started becoming paranoid and agitated while living with his parents, eventually had to be [facility] in [facility].  Over the years patient has calmed down, able to go to the grocery store for food, cigarettes but has no insight and unable to truly care for himself.  [place] to an extent that when EMS went to retrieve him today, authorities were planning to "condemn" his trailer home.  Patient's brother has given him a debit card and provides with the finances.  Patient last visited his brother on [birth year] his birthday at which point he was in his usual state of health except he complained of some toe pain but was managing to ambulate.  Since then the brother was trying to reach him by phone for several days and went over to his house yesterday but did not get an answer at the door.  He also noticed decrease pending on the debit card that he had provided.  When he returned today he heard some soft noises in the house and called 911 and EMS found the patient on the ground in filthy condition, confused.  Patient is unable to provide any history.  He cannot tell me why he is in the hospital.  At one point he said he was here to get "checked".  ED Course: Noted to be in filthy condition, tachycardic, not febrile, not hypotensive, leukocytosis, elevated lactate, right toe exam suggestive of possible cellulitis, initiated sepsis protocol and received IV fluids and broad-spectrum IV antibiotics.   Orthopedics consulted by EDP and will see him in the morning.  WBC 14.6, hemoglobin 18.4, glucose 211, BUN 33, AST 136, ALT 69, total bilirubin 1.5, CK 3000 323, troponin 0 0.03, urine microscopy not suggestive of UTI, chest x-ray negative, CT head suggests subacute to chronic ischemia, right foot x-ray showed comminuted fracture of the first distal phalanx.  Assessment & Plan:   Active Problems:   Acute metabolic encephalopathy   Toe fracture, right 1st with cellulitis and?  Gangrene versus hematoma.   Rhabdomyolysis   Dehydration   Cellulitis   Sepsis (HCC)   Adult failure to thrive   #1 right foot cellulitis/right great toe gangrene with comminuted fracture of the right first distal phalanx extending to the articular surface-continue Vanco and cefepime empirically.  Patient to have surgery tue morning dr duda.  #2 acute metabolic encephalopathy secondary to combination of stroke sepsis and dehydration.  Patient pulled out all his IVs overnight and staff is not able to complete his IV fluids.  Will have a sitter by the side so he will not pull out any more lines and place IV and restart IV fluids he still appears to be dehydrated.  Ammonia level normal TSH normal.  MRI of the brain acute to subacute nonhemorrhagic infarct involving the left internal capsule and basal ganglia with the timeframe of greater than 6 hours.  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  #3 rhabdomyolysis patient was found on the floor for  uncertain amount of time.  New IV fluids CPK downtrending.  #4 type 2 diabetes hemoglobin A1c 7.7 SSI for now patient does not take any medications at home  #5 elevated LFTs improving continue IV fluids and monitor  #6 generalized weakness PT OT consult patient most likely will need placement at this time he is unable to take care of himself and live alone.  Prior to admission he lived in a trailer at home alone.  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  PT OT speech consult.  His hemoglobin A1c is 7.7 obtain lipid panel.  TSH 0.664.  Obtain echocardiogram.  Discussed with neurology.  Start aspirin.   Estimated body mass index is 24.41 kg/m as calculated from the following:   Height as of this encounter: 6' (1.829 m).   Weight as of this encounter: 81.6 kg.  DVT prophylaxis: Lovenox  code Status: Full code (Full/Partial - specify details) Family Communication: None available Disposition Plan: Pending further clinical progress consultants:   Dr. Lajoyce Corners  Procedures: None Antimicrobials: Vanco and cefepime received a dose of Flagyl in the ER  Subjective: Patient resting in bed he is able staff by the bedside pulled out all the 4 lines overnight  Objective: Vitals:   02/24/2019 1938 02/07/2019 2113 02/18/2019 2242 02/28/19 0518  BP:  (!) 182/109 (!) 187/106 (!) 160/92  Pulse: (!) 104 (!) 106 (!) 105 (!) 110  Resp: 16 18  16   Temp:  98.1 F (36.7 C)  98.1 F (36.7 C)  TempSrc:  Oral  Oral  SpO2: 95% 98%  96%  Weight:      Height:        Intake/Output Summary (Last 24 hours) at 02/28/2019 0757 Last data filed at 02/19/2019 1845 Gross per 24 hour  Intake 1200.02 ml  Output --  Net 1200.02 ml   Filed Weights   02/14/2019 1454  Weight: 81.6 kg    Examination:  General exam: Appears calm and comfortable  Respiratory system: Clear to auscultation. Respiratory effort normal. Cardiovascular system: S1 & S2 heard, RRR. No JVD, murmurs, rubs, gallops or clicks. No pedal edema. Gastrointestinal system: Abdomen is nondistended, soft and nontender. No organomegaly or masses felt. Normal bowel sounds heard. Central nervous system: He is awake unable to tell me where he is Extremities: Right big toe bruised and black in color and right foot swollen erythematous tender Skin: No rashes, lesions or ulcers    Data Reviewed: I have personally reviewed following labs and imaging  studies  CBC: Recent Labs  Lab 03/06/2019 1450 02/28/19 0338  WBC 14.6* 13.9*  NEUTROABS 10.9*  --   HGB 18.4* 16.8  HCT 55.4* 49.3  MCV 88.4 88.5  PLT 314 274   Basic Metabolic Panel: Recent Labs  Lab 02/18/2019 1450 02/28/19 0338  NA 141 141  K 4.1 3.8  CL 101 105  CO2 23 23  GLUCOSE 211* 213*  BUN 33* 25*  CREATININE 1.11 1.15  CALCIUM 9.7 8.9   GFR: Estimated Creatinine Clearance: 76.9 mL/min (by C-G formula based on SCr of 1.15 mg/dL). Liver Function Tests: Recent Labs  Lab 02/22/2019 1450 02/28/19 0338  AST 136* 112*  ALT 69* 67*  ALKPHOS 98 79  BILITOT 1.5* 0.9  PROT 7.5 6.2*  ALBUMIN 3.7 3.1*   Recent Labs  Lab 02/16/2019 1450  LIPASE 36   Recent Labs  Lab 03/04/2019 1519  AMMONIA 25   Coagulation Profile: No results for input(s): INR, PROTIME in  the last 168 hours. Cardiac Enzymes: Recent Labs  Lab 06-Mar-2019 1450 03/06/19 2235 02/28/19 0338  CKTOTAL 3,323*  --  2,223*  TROPONINI 0.03* <0.03 0.06*   BNP (last 3 results) No results for input(s): PROBNP in the last 8760 hours. HbA1C: Recent Labs    2019/03/06 1450  HGBA1C 7.7*   CBG: Recent Labs  Lab Mar 06, 2019 1445 2019/03/06 1933  GLUCAP 233* 193*   Lipid Profile: No results for input(s): CHOL, HDL, LDLCALC, TRIG, CHOLHDL, LDLDIRECT in the last 72 hours. Thyroid Function Tests: Recent Labs    06-Mar-2019 1450  TSH 0.664   Anemia Panel: No results for input(s): VITAMINB12, FOLATE, FERRITIN, TIBC, IRON, RETICCTPCT in the last 72 hours. Sepsis Labs: Recent Labs  Lab 03/06/19 1450 03-06-19 1801  LATICACIDVEN 2.3* 2.0*    No results found for this or any previous visit (from the past 240 hour(s)).       Radiology Studies: Dg Chest 2 View  Result Date: 03/06/19 CLINICAL DATA:  Altered mental status today. EXAM: CHEST - 2 VIEW COMPARISON:  Single-view of the chest 07/16/2006. FINDINGS: Lungs clear. Heart size normal. No pneumothorax or pleural fluid. No acute or focal bony  abnormality. IMPRESSION: Negative chest. Electronically Signed   By: Drusilla Kanner M.D.   On: 03-06-2019 16:00   Dg Knee 2 Views Right  Result Date: March 06, 2019 CLINICAL DATA:  Knee pain, initial encounter EXAM: RIGHT KNEE - 2 VIEW COMPARISON:  None. FINDINGS: Mild medial joint space narrowing is noted. No acute fracture or dislocation is seen. Mild patellofemoral spurring is noted as well. IMPRESSION: Mild degenerative change without acute abnormality. Electronically Signed   By: Alcide Clever M.D.   On: 2019/03/06 16:03   Ct Head Wo Contrast  Result Date: 2019-03-06 CLINICAL DATA:  Altered level of consciousness EXAM: CT HEAD WITHOUT CONTRAST TECHNIQUE: Contiguous axial images were obtained from the base of the skull through the vertex without intravenous contrast. COMPARISON:  None. FINDINGS: Brain: Mild atrophic changes are noted. Encephalomalacia changes are seen in the distribution of the right middle cerebral artery consistent with prior infarct. Rounded decreased area of attenuation is noted in the region of the internal capsule on the left suggestive of subacute to chronic ischemia. No focal area of acute infarct or acute hemorrhage is seen. No space-occupying mass lesion is noted. Vascular: No hyperdense vessel or unexpected calcification. Skull: Normal. Negative for fracture or focal lesion. Sinuses/Orbits: No acute finding. Other: None. IMPRESSION: Chronic atrophic changes. 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 Rounded somewhat elongated area of decreased attenuation on the left in the region of the internal capsule and basal ganglia consistent with subacute to chronic ischemia. No acute infarct is noted. Electronically Signed   By: Alcide Clever M.D.   On: 2019/03/06 16:06   [name]  Result Date: 03-06-2019 CLINICAL DATA:  Focal neuro deficit for greater than 6 hours. Altered level of consciousness. EXAM: MRI HEAD WITHOUT CONTRAST TECHNIQUE: Multiplanar,  multiecho pulse sequences of the brain and surrounding structures were obtained without intravenous contrast. COMPARISON:  CT head without contrast 01/29/2019 FINDINGS: Brain: The diffusion-weighted images confirm an acute nonhemorrhagic infarct involving the genu of the left internal capsule and globus pallidus. T2 signal changes are associated with the acute infarct, consistent with the subacute time frame. Remote encephalomalacia is again noted right MCA territory. The ventricles are of proportionate to the degree of atrophy. Wallerian degeneration is present in the right cerebral peduncle extending into the pons. Cerebellum is  normal. No significant extraaxial fluid collection is present. Vascular: Flow is present in the major intracranial arteries. Skull and upper cervical spine: The craniocervical junction is normal. Upper cervical spine is within normal limits. Marrow signal is unremarkable. Sinuses/Orbits: The paranasal sinuses and mastoid air cells are clear. Bilateral lens replacements are noted. The globes and orbits are within normal limits. IMPRESSION: 1. 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 This is consistent with a time frame of greater than 6 hours. 2. 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 Electronically Signed   By: Marin Roberts M.D.   On: 03-07-2019 19:18   Dg Foot Complete Right  Result Date: Mar 07, 2019 CLINICAL DATA:  Right foot pain EXAM: RIGHT FOOT COMPLETE - 3+ VIEW COMPARISON:  None. FINDINGS: Comminuted fracture is noted at the base of the first distal phalanx. This extends into the articular surface in multiple locations. No other fracture is seen. Soft tissue swelling is noted. IMPRESSION: Comminuted fracture of the first distal phalanx which extends to the articular surface. Electronically Signed   By: Alcide Clever M.D.   On: Mar 07, 2019 16:01        Scheduled  Meds:  enoxaparin (LOVENOX) injection  40 mg Subcutaneous Q24H   folic acid  1 mg Oral Daily   insulin aspart  0-9 Units Subcutaneous TID WC   multivitamin with minerals  1 tablet Oral Daily   nicotine  21 mg Transdermal Daily   sodium chloride flush  3 mL Intravenous Q12H   thiamine  100 mg Oral Daily   Continuous Infusions:  sodium chloride 100 mL/hr at 02/28/19 0044   ceFEPime (MAXIPIME) IV     banana bag IV 1000 mL     vancomycin       LOS: 1 day     Alwyn Ren, MD [facility]  If 7PM-7AM, please contact night-coverage [email or web address] Password [facility] 02/28/2019, 7:57 AM

## 2019-03-01 ENCOUNTER — Inpatient Hospital Stay (HOSPITAL_COMMUNITY): Payer: Self-pay

## 2019-03-01 ENCOUNTER — Ambulatory Visit (INDEPENDENT_AMBULATORY_CARE_PROVIDER_SITE_OTHER): Payer: Self-pay | Admitting: Physician Assistant

## 2019-03-01 DIAGNOSIS — R945 Abnormal results of liver function studies: Secondary | ICD-10-CM

## 2019-03-01 DIAGNOSIS — M86171 Other acute osteomyelitis, right ankle and foot: Secondary | ICD-10-CM

## 2019-03-01 DIAGNOSIS — S92424B Nondisplaced fracture of distal phalanx of right great toe, initial encounter for open fracture: Secondary | ICD-10-CM | POA: Diagnosis present

## 2019-03-01 DIAGNOSIS — R7989 Other specified abnormal findings of blood chemistry: Secondary | ICD-10-CM | POA: Diagnosis present

## 2019-03-01 DIAGNOSIS — I6523 Occlusion and stenosis of bilateral carotid arteries: Secondary | ICD-10-CM

## 2019-03-01 DIAGNOSIS — I639 Cerebral infarction, unspecified: Secondary | ICD-10-CM

## 2019-03-01 DIAGNOSIS — I96 Gangrene, not elsewhere classified: Secondary | ICD-10-CM

## 2019-03-01 DIAGNOSIS — I6381 Other cerebral infarction due to occlusion or stenosis of small artery: Secondary | ICD-10-CM | POA: Diagnosis present

## 2019-03-01 DIAGNOSIS — I6529 Occlusion and stenosis of unspecified carotid artery: Secondary | ICD-10-CM | POA: Diagnosis present

## 2019-03-01 DIAGNOSIS — I350 Nonrheumatic aortic (valve) stenosis: Secondary | ICD-10-CM

## 2019-03-01 DIAGNOSIS — E119 Type 2 diabetes mellitus without complications: Secondary | ICD-10-CM

## 2019-03-01 DIAGNOSIS — I6330 Cerebral infarction due to thrombosis of unspecified cerebral artery: Secondary | ICD-10-CM

## 2019-03-01 DIAGNOSIS — I6522 Occlusion and stenosis of left carotid artery: Secondary | ICD-10-CM

## 2019-03-01 LAB — COMPREHENSIVE METABOLIC PANEL
ALT: 65 U/L — AB (ref 0–44)
AST: 97 U/L — AB (ref 15–41)
Albumin: 2.9 g/dL — ABNORMAL LOW (ref 3.5–5.0)
Alkaline Phosphatase: 75 U/L (ref 38–126)
Anion gap: 12 (ref 5–15)
BUN: 16 mg/dL (ref 6–20)
CO2: 25 mmol/L (ref 22–32)
CREATININE: 0.99 mg/dL (ref 0.61–1.24)
Calcium: 8.6 mg/dL — ABNORMAL LOW (ref 8.9–10.3)
Chloride: 103 mmol/L (ref 98–111)
GFR calc Af Amer: >60 mL/min (ref >60)
GFR calc non Af Amer: >60 mL/min (ref >60)
Glucose, Bld: 123 mg/dL — ABNORMAL HIGH (ref 70–99)
Potassium: 3.5 mmol/L (ref 3.5–5.1)
Sodium: 140 mmol/L (ref 135–145)
Total Bilirubin: 0.6 mg/dL (ref 0.3–1.2)
Total Protein: 6.1 g/dL — ABNORMAL LOW (ref 6.5–8.1)

## 2019-03-01 LAB — CBC WITH DIFFERENTIAL/PLATELET
ABS IMMATURE GRANULOCYTES: 0.2 10*3/uL — AB (ref 0.00–0.07)
BASOS ABS: 0.1 10*3/uL (ref 0.0–0.1)
Basophils Relative: 0 %
Eosinophils Absolute: 0.2 10*3/uL (ref 0.0–0.5)
Eosinophils Relative: 2 %
HCT: 46.9 % (ref 39.0–52.0)
Hemoglobin: 15.5 g/dL (ref 13.0–17.0)
Immature Granulocytes: 2 %
Lymphocytes Relative: 21 %
Lymphs Abs: 2.6 10*3/uL (ref 0.7–4.0)
MCH: 29.5 pg (ref 26.0–34.0)
MCHC: 33 g/dL (ref 30.0–36.0)
MCV: 89.2 fL (ref 80.0–100.0)
Monocytes Absolute: 1.6 10*3/uL — ABNORMAL HIGH (ref 0.1–1.0)
Monocytes Relative: 13 %
NEUTROS ABS: 7.7 10*3/uL (ref 1.7–7.7)
Neutrophils Relative %: 62 %
Platelets: 259 10*3/uL (ref 150–400)
RBC: 5.26 MIL/uL (ref 4.22–5.81)
RDW: 12.8 % (ref 11.5–15.5)
WBC: 12.4 10*3/uL — ABNORMAL HIGH (ref 4.0–10.5)
nRBC: 0 % (ref 0.0–0.2)

## 2019-03-01 LAB — SURGICAL PCR SCREEN
MRSA, PCR: NEGATIVE
Staphylococcus aureus: NEGATIVE

## 2019-03-01 LAB — LIPID PANEL
Cholesterol: 136 mg/dL (ref 0–200)
HDL: 20 mg/dL — ABNORMAL LOW (ref >40)
LDL Cholesterol: 81 mg/dL (ref 0–99)
Total CHOL/HDL Ratio: 6.8 RATIO
Triglycerides: 176 mg/dL — ABNORMAL HIGH (ref <150)
VLDL: 35 mg/dL (ref 0–40)

## 2019-03-01 LAB — GLUCOSE, CAPILLARY
GLUCOSE-CAPILLARY: 121 mg/dL — AB (ref 70–99)
Glucose-Capillary: 117 mg/dL — ABNORMAL HIGH (ref 70–99)
Glucose-Capillary: 145 mg/dL — ABNORMAL HIGH (ref 70–99)
Glucose-Capillary: 96 mg/dL (ref 70–99)

## 2019-03-01 LAB — CK: Total CK: 1051 U/L — ABNORMAL HIGH (ref 49–397)

## 2019-03-01 MED ORDER — LIVING WELL WITH DIABETES BOOK
Freq: Once | Status: AC
  Administered 2019-03-01: 13:00:00

## 2019-03-01 MED ORDER — CHLORHEXIDINE GLUCONATE 4 % EX LIQD
60.0000 mL | Freq: Once | CUTANEOUS | Status: AC
  Administered 2019-03-02: 4 via TOPICAL
  Filled 2019-03-01: qty 60

## 2019-03-01 MED ORDER — CEFAZOLIN SODIUM-DEXTROSE 2-4 GM/100ML-% IV SOLN
2.0000 g | INTRAVENOUS | Status: AC
  Administered 2019-03-02: 2 g via INTRAVENOUS
  Filled 2019-03-01 (×2): qty 100

## 2019-03-01 MED ORDER — SODIUM CHLORIDE 0.9% FLUSH
10.0000 mL | INTRAVENOUS | Status: DC | PRN
  Administered 2019-03-03: 10 mL
  Filled 2019-03-01: qty 40

## 2019-03-01 NOTE — Progress Notes (Signed)
Patient ID: Noah Thomas, male   DOB: 14-Mar-1961, 58 y.o.   MRN: [id number]  Plan for right foot first ray amputation Tuesday

## 2019-03-01 NOTE — Anesthesia Preprocedure Evaluation (Addendum)
Anesthesia Evaluation  Patient identified by MRN, date of birth, ID band Patient awake    Reviewed: Allergy & Precautions, NPO status , Patient's Chart, lab work & pertinent test results  Airway Mallampati: II  TM Distance: >3 FB Neck ROM: Full    Dental  (+) Dental Advisory Given   Pulmonary neg pulmonary ROS, Current Smoker,    Pulmonary exam normal breath sounds clear to auscultation       Cardiovascular negative cardio ROS Normal cardiovascular exam Rhythm:Regular Rate:Normal     Neuro/Psych  Neuromuscular disease CVA negative psych ROS   GI/Hepatic negative GI ROS, Neg liver ROS,   Endo/Other  diabetes  Renal/GU negative Renal ROS     Musculoskeletal negative musculoskeletal ROS (+)   Abdominal   Peds  Hematology negative hematology ROS (+)   Anesthesia Other Findings   Reproductive/Obstetrics                             Anesthesia Physical Anesthesia Plan  ASA: III  Anesthesia Plan: Regional   Post-op Pain Management:    Induction: Intravenous  PONV Risk Score and Plan: 2 and Ondansetron, Dexamethasone, Treatment may vary due to age or medical condition and Midazolam  Airway Management Planned: Simple Face Mask  Additional Equipment: None  Intra-op Plan:   Post-operative Plan:   Informed Consent: I have reviewed the patients History and Physical, chart, labs and discussed the procedure including the risks, benefits and alternatives for the proposed anesthesia with the patient or authorized representative who has indicated his/her understanding and acceptance.     Dental advisory given  Plan Discussed with: CRNA  Anesthesia Plan Comments:        Anesthesia Quick Evaluation

## 2019-03-01 NOTE — Consult Note (Addendum)
Hospital Consult    Reason for Consult:  Gangrene right foot Requesting Physician:  Lajoyce Corners MRN #:  [id number]  History of Present Illness: Pt with gangrenous right first toe and sub acute left brain infarct with carotid stenosis.  This is a 58 y.o. male who presented to the ED via EMS with AMS.  He was found on the floor in filthy conditions.  Per IM's note, the pt's brother stated that the at was normal until teenage years and at that time, he became paranoid and agitated and was eventually admitted to [facility].  Over the years, pt is able to go to the store for food & cigarettes but does not have insight and unable to truly care for himself.    Pt's brother went to check on him and ended up calling 911 and that is when EMS found him down in the house in [place] conditions and confused.    Pt alert in the room, but when asked questions, he does not really answer them.  He states the does not know how long he has had the wound on his foot nor does he remember injuring it.  He states that he does not know if his parents had any medical hx.  He states he does smoke about a pack per day.  He is not on any medications at home.   He denies any pain in his legs when walking or at rest.  He initially had rhabdo which is resolving with IV fluid/hydratiion.  He is a very poor historian.   The pt is not on a statin for cholesterol management.  The pt is not on a daily aspirin.   Other AC:  Lovenox for DVT prophylaxis The pt is not on meds for hypertension.   The pt is diabetic.  On SSI here in hospital.  Not on meds PTA Tobacco hx:  current  History reviewed. No pertinent past medical history.  History reviewed. No pertinent surgical history.  No Known Allergies  Prior to Admission medications   Medication Sig Start Date End Date Taking? Authorizing Provider  acetaminophen (TYLENOL) 325 MG tablet Take 975 mg by mouth every 6 (six) hours as needed for mild pain, fever or headache.   Yes  [provider]    Social History   Socioeconomic History  . Marital status: Single    Spouse name: Not on file  . Number of children: Not on file  . Years of education: Not on file  . Highest education level: Not on file  Occupational History  . Not on file  Social Needs  . Financial resource strain: Not on file  . Food insecurity:    Worry: Not on file    Inability: Not on file  . Transportation needs:    Medical: Not on file    Non-medical: Not on file  Tobacco Use  . Smoking status: Current Every Day Smoker  Substance and Sexual Activity  . Alcohol use: Yes    Comment: Not much as per brother.  . Drug use: Not Currently  . Sexual activity: Not on file  Lifestyle  . Physical activity:    Days per week: Not on file    Minutes per session: Not on file  . Stress: Not on file  Relationships  . Social connections:    Talks on phone: Not on file    Gets together: Not on file    Attends religious service: Not on file    Active member  of club or organization: Not on file    Attends meetings of clubs or organizations: Not on file    Relationship status: Not on file  . Intimate partner violence:    Fear of current or ex partner: Not on file    Emotionally abused: Not on file    Physically abused: Not on file    Forced sexual activity: Not on file  Other Topics Concern  . Not on file  Social History Narrative  . Not on file     Family Hx:  Pt does not know of any medical hx when asked.    ROS:  Positive    Negative    All sytems reviewed and are negative  Cardiac:  chest pain/pressure  palpitations  SOB lying flat  DOE  Vascular:  pain in legs while walking  pain in legs at rest  pain in legs at night  non-healing ulcers-gangrene right foot  hx of DVT  swelling in legs  Pulmonary:  productive cough  asthma/wheezing  home O2  Neurologic:  hx of CVA  mini stroke  Hematologic:  hx of cancer   Endocrine:    diabetes  thyroid disease  GI  vomiting blood  blood in stool  GU:  CKD/renal failure  HD--[]  M/W/F or  T/T/S  burning with urination  blood in urine  Psychiatric:  paranoid; placed in [facility] remotely   Musculoskeletal:  arthritis  joint pain  Integumentary:  rashes  ulcers  Constitutional:  fever  chills   Physical Examination  Vitals:   02/28/19 2030 03/01/19 0413  BP: (!) 184/101 (!) 168/98  Pulse: 89 76  Resp: 14 14  Temp: 98.3 F (36.8 C) 98.6 F (37 C)  SpO2: 97% 95%   Body mass index is 24.41 kg/m.  General:  WDWN in NAD Gait: Not observed HENT: WNL, normocephalic Pulmonary: normal non-labored breathing, without Rales, rhonchi,  wheezing Cardiac: regularly irregular, without  Murmurs, rubs or gallops; without carotid bruits Abdomen:  soft, NT/ND, no masses Skin: without rashes Vascular Exam/Pulses:  Right Left  Radial 2+ (normal) 2+ (normal)  Ulnar Unable to palpate  Unable to palpate   Femoral 2+ (normal) Unable to palpate due to position  Popliteal Unable to palpate  Unable to palpate   DP Unable to palpate  2+ (normal)  PT Unable to palpate  2+ (normal)   Extremities: right great toe wound with erythema onto the dorsum of the foot. Musculoskeletal: no muscle wasting or atrophy  Neurologic: A&O X 3;  No focal weakness or paresthesias are detected; speech is fluent/normal Psychiatric:  The pt has flat affect.   CBC    Component Value Date/Time   WBC 12.4 (H) 03/01/2019 0348   RBC 5.26 03/01/2019 0348   HGB 15.5 03/01/2019 0348   HCT 46.9 03/01/2019 0348   PLT 259 03/01/2019 0348   MCV 89.2 03/01/2019 0348   MCH 29.5 03/01/2019 0348   MCHC 33.0 03/01/2019 0348   RDW 12.8 03/01/2019 0348   LYMPHSABS 2.6 03/01/2019 0348   MONOABS 1.6 (H) 03/01/2019 0348   EOSABS 0.2 03/01/2019 0348   BASOSABS 0.1 03/01/2019 0348    BMET    Component Value Date/Time   NA 140  03/01/2019 0348   K 3.5 03/01/2019 0348   CL 103 03/01/2019 0348   CO2 25 03/01/2019 0348   GLUCOSE 123 (H) 03/01/2019 0348   BUN 16 03/01/2019 0348   CREATININE 0.99 03/01/2019 0348   CALCIUM  8.6 (L) 03/01/2019 0348   GFRNONAA >60 03/01/2019 0348   GFRAA >60 03/01/2019 0348    COAGS: No results found for: INR, PROTIME   Non-Invasive Vascular Imaging:   ABI's 03/01/2019: Right:  0.40 Left:  1.01   ASSESSMENT/PLAN: This is a 58 y.o. male with gangrene of right great toe  -significantly decreased ABI's on the left-will need arteriogram to evaluate RLE.   Most likely will need consent from family  -pt going for right great toe amputation tomorrow by Dr. Lajoyce Corners -Normal ABI on the left with palpable pedal pulses.   -Dr. Darrick Penna to see later today     Doreatha Massed, PA-C [facility] [phone]  History and exam details as above.    1.  Pt with symptomatic left ICA stenosis.  CTA shows a very bulky atheromatous lesion left ICA and fairly high lesion.  He also has about 70% right ICA stenosis.  He does not have focal motor deficits. He is confused but difficult to know where baseline is.  Will get duplex of carotids tomorrow to further define distal extent of lesion but may need carotid angio and consideration for TCAR.  2. Right first toe gangrene.  Agree with Dr Lajoyce Corners with toe amp tomorrow to control sepsis.  Will plan agram possible intervention by Dr Chestine Spore on Wednesday  Fabienne Bruns, MD [facility] [place] Office: [phone] Pager: [phone]

## 2019-03-01 NOTE — Progress Notes (Signed)
VASCULAR LAB PRELIMINARY  PRELIMINARY  PRELIMINARY  PRELIMINARY  ABIs complete completed.    Preliminary report:  See CV proc for preliminary report  Alizzon Dioguardi, RVT 03/01/2019, 9:06 AM

## 2019-03-01 NOTE — Progress Notes (Signed)
STROKE TEAM PROGRESS NOTE   INTERVAL HISTORY Patient sitting in chair, no family at bedside.  Denies weakness or numbness.  Educated patient on smoke cessation, alcohol limitation and better control of diabetes.  Is going to have right total surgery tomorrow.  Vitals:   02/28/19 0518 02/28/19 1411 02/28/19 2030 03/01/19 0413  BP: (!) 160/92 (!) 179/101 (!) 184/101 (!) 168/98  Pulse: (!) 110 96 89 76  Resp: 16 16 14 14   Temp: 98.1 F (36.7 C) 98.4 F (36.9 C) 98.3 F (36.8 C) 98.6 F (37 C)  TempSrc: Oral Oral Oral Oral  SpO2: 96% 97% 97% 95%  Weight:      Height:        CBC:  Recent Labs  Lab 02/12/2019 1450 02/28/19 0338 03/01/19 0348  WBC 14.6* 13.9* 12.4*  NEUTROABS 10.9*  --  7.7  HGB 18.4* 16.8 15.5  HCT 55.4* 49.3 46.9  MCV 88.4 88.5 89.2  PLT 314 274 259    Basic Metabolic Panel:  Recent Labs  Lab 02/28/19 0338 03/01/19 0348  NA 141 140  K 3.8 3.5  CL 105 103  CO2 23 25  GLUCOSE 213* 123*  BUN 25* 16  CREATININE 1.15 0.99  CALCIUM 8.9 8.6*   Lipid Panel:     Component Value Date/Time   CHOL 136 03/01/2019 0348   TRIG 176 (H) 03/01/2019 0348   HDL 20 (L) 03/01/2019 0348   CHOLHDL 6.8 03/01/2019 0348   VLDL 35 03/01/2019 0348   LDLCALC 81 03/01/2019 0348   HgbA1c:  Lab Results  Component Value Date   HGBA1C 7.7 (H) 02/09/2019   Urine Drug Screen: No results found for: LABOPIA, COCAINSCRNUR, LABBENZ, AMPHETMU, THCU, LABBARB  Alcohol Level     Component Value Date/Time   ETH <10 02/19/2019 1450    IMAGING Ct Angio Head W Or Wo Contrast  Result Date: 02/28/2019 CLINICAL DATA:  Stroke follow-up. 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 EXAM: CT ANGIOGRAPHY HEAD AND NECK TECHNIQUE: Multidetector CT imaging of the head and neck was performed using the standard protocol during bolus administration of intravenous contrast. Multiplanar CT image reconstructions and MIPs were obtained to evaluate the  vascular anatomy. Carotid stenosis measurements (when applicable) are obtained utilizing NASCET criteria, using the distal internal carotid diameter as the denominator. CONTRAST:  24mL ISOVUE-370 IOPAMIDOL (ISOVUE-370) INJECTION 76% COMPARISON:  MRI of the brain 02/23/2019 FINDINGS: CT HEAD FINDINGS Brain: The left internal capsule infarct is again noted, now slightly lower density than on the previous CT. No new infarct is present. The remote right MCA territory encephalomalacia is stable. The ventricles are of proportionate to the degree of atrophy. No significant extraaxial fluid collection is present. The brainstem and cerebellum are within normal limits. Vascular: Atherosclerotic calcifications are present within the cavernous internal carotid arteries bilaterally. There is no hyperdense vessel. Skull: The craniocervical junction is normal. Upper cervical spine is within normal limits. Marrow signal is unremarkable. Sinuses: Scattered opacification of left ethmoid air cells are present. There are no fluid levels. The paranasal sinuses and mastoid air cells are otherwise clear. Orbits: The globes and orbits are within normal limits. Review of the MIP images confirms the above findings CTA NECK FINDINGS Aortic arch: A 3 vessel arch configuration is present. Minimal atherosclerotic changes are present at the great vessel origins. There is no significant stenosis or aneurysm. Right carotid system: The right common carotid artery is within normal limits. Dense calcifications are present at the  right carotid bifurcation. There is a high-grade stenosis at the carotid bifurcation. Lumen is narrowed to less than 1 mm. The more distal right common carotid artery is within normal limits to the skull base. Left carotid system: The left common carotid artery demonstrates some atherosclerotic irregularity. There is a high-grade, near occlusive stenosis of the left internal carotid artery at its bifurcation. The cervical left  ICA is otherwise normal. Vertebral arteries: Extensive atherosclerotic calcifications are present along the vertebral arteries bilaterally. The right vertebral artery is dominant. There is a high-grade stenosis at the proximal left vertebral artery. No significant stenosis is present in the right vertebral artery. The left vertebral artery is reconstituted at the distal V1 segment. Extensive atherosclerotic changes are present throughout the V2 segment. These are high-grade stenosis at the level of C1. Skeleton: Vertebral body heights alignment are maintained. No focal lytic or blastic lesions are present. Other neck: The soft tissues the neck are otherwise unremarkable. No focal mucosal or submucosal lesions are present. Salivary glands are within normal limits. No significant adenopathy is present. Thyroid is normal. Upper chest: Next mild dependent atelectasis is present. The lung apices are otherwise clear. Thoracic inlet is within normal limits. Review of the MIP images confirms the above findings CTA HEAD FINDINGS Anterior circulation: Atherosclerotic calcifications are present within the cavernous internal carotid arteries bilaterally without a significant stenosis through the ICA termini. The left A1 is hypoplastic. The right A1 is normal. The anterior communicating artery is patent. ACA branch vessels are within normal limits bilaterally. There is a high-grade stenosis of the anterior right M2 segment with marked attenuation of distal branches. Diffuse irregularity present and more posterior left MCA branches. There is moderate irregularity in left MCA branches. Pial collaterals are evident. Posterior circulation: The right vertebral artery is the dominant vessel. Segmental irregularity is present in the left V4 segment without a significant stenosis. PICA origins are visualized and normal. The vertebrobasilar junction is normal. The basilar artery is normal. Both posterior cerebral arteries originate from  the basilar tip. There is some irregularity of the proximal PCA vessels without significant proximal stenosis. Branch vessels are intact. Venous sinuses: The dural sinuses are patent. Anatomic variants: None Delayed phase: No pathologic enhancement is present. Infarcts are well-defined. Review of the MIP images confirms the above findings IMPRESSION: 1. High-grade bilateral proximal ICA stenoses at the carotid bifurcations. 2. 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 3. Hypoplastic left A1 segment. 4. High-grade stenosis of the anterior right M2 segment. 5. Moderate diffuse medium and distal small vessel disease in both the anterior and posterior circulations. 6. Expected evolution of left internal capsule nonhemorrhagic infarct. 7. 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 Electronically Signed   By: Marin Roberts M.D.   On: 02/28/2019 21:01   Dg Chest 2 View  Result Date: 03/11/19 CLINICAL DATA:  Altered mental status today. EXAM: CHEST - 2 VIEW COMPARISON:  Single-view of the chest 07/16/2006. FINDINGS: Lungs clear. Heart size normal. No pneumothorax or pleural fluid. No acute or focal bony abnormality. IMPRESSION: Negative chest. Electronically Signed   By: Drusilla Kanner M.D.   On: 03-11-19 16:00   Dg Knee 2 Views Right  Result Date: March 11, 2019 CLINICAL DATA:  Knee pain, initial encounter EXAM: RIGHT KNEE - 2 VIEW COMPARISON:  None. FINDINGS: Mild medial joint space narrowing is noted. No acute fracture or dislocation is seen. Mild patellofemoral spurring is noted as well. IMPRESSION: Mild degenerative  change without acute abnormality. Electronically Signed   By: Alcide Clever M.D.   On: 02/11/2019 16:03   Ct Head Wo Contrast  Result Date: 02/09/2019 CLINICAL DATA:  Altered level of consciousness EXAM: CT HEAD WITHOUT CONTRAST TECHNIQUE: Contiguous axial images were obtained from the base of the skull through  the vertex without intravenous contrast. COMPARISON:  None. FINDINGS: Brain: Mild atrophic changes are noted. Encephalomalacia changes are seen in the distribution of the right middle cerebral artery consistent with prior infarct. Rounded decreased area of attenuation is noted in the region of the internal capsule on the left suggestive of subacute to chronic ischemia. No focal area of acute infarct or acute hemorrhage is seen. No space-occupying mass lesion is noted. Vascular: No hyperdense vessel or unexpected calcification. Skull: Normal. Negative for fracture or focal lesion. Sinuses/Orbits: No acute finding. Other: None. IMPRESSION: Chronic atrophic changes. 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 Rounded somewhat elongated area of decreased attenuation on the left in the region of the internal capsule and basal ganglia consistent with subacute to chronic ischemia. No acute infarct is noted. Electronically Signed   By: Alcide Clever M.D.   On: 02/26/2019 16:06   Ct Angio Neck W Or Wo Contrast  Result Date: 02/28/2019 CLINICAL DATA:  Stroke follow-up. 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 EXAM: CT ANGIOGRAPHY HEAD AND NECK TECHNIQUE: Multidetector CT imaging of the head and neck was performed using the standard protocol during bolus administration of intravenous contrast. Multiplanar CT image reconstructions and MIPs were obtained to evaluate the vascular anatomy. Carotid stenosis measurements (when applicable) are obtained utilizing NASCET criteria, using the distal internal carotid diameter as the denominator. CONTRAST:  75mL ISOVUE-370 IOPAMIDOL (ISOVUE-370) INJECTION 76% COMPARISON:  MRI of the brain 02/26/2019 FINDINGS: CT HEAD FINDINGS Brain: The left internal capsule infarct is again noted, now slightly lower density than on the previous CT. No new infarct is present. The remote right MCA territory encephalomalacia is stable. The  ventricles are of proportionate to the degree of atrophy. No significant extraaxial fluid collection is present. The brainstem and cerebellum are within normal limits. Vascular: Atherosclerotic calcifications are present within the cavernous internal carotid arteries bilaterally. There is no hyperdense vessel. Skull: The craniocervical junction is normal. Upper cervical spine is within normal limits. Marrow signal is unremarkable. Sinuses: Scattered opacification of left ethmoid air cells are present. There are no fluid levels. The paranasal sinuses and mastoid air cells are otherwise clear. Orbits: The globes and orbits are within normal limits. Review of the MIP images confirms the above findings CTA NECK FINDINGS Aortic arch: A 3 vessel arch configuration is present. Minimal atherosclerotic changes are present at the great vessel origins. There is no significant stenosis or aneurysm. Right carotid system: The right common carotid artery is within normal limits. Dense calcifications are present at the right carotid bifurcation. There is a high-grade stenosis at the carotid bifurcation. Lumen is narrowed to less than 1 mm. The more distal right common carotid artery is within normal limits to the skull base. Left carotid system: The left common carotid artery demonstrates some atherosclerotic irregularity. There is a high-grade, near occlusive stenosis of the left internal carotid artery at its bifurcation. The cervical left ICA is otherwise normal. Vertebral arteries: Extensive atherosclerotic calcifications are present along the vertebral arteries bilaterally. The right vertebral artery is dominant. There is a high-grade stenosis at the proximal left vertebral artery. No significant stenosis is present in the right vertebral  artery. The left vertebral artery is reconstituted at the distal V1 segment. Extensive atherosclerotic changes are present throughout the V2 segment. These are high-grade stenosis at the  level of C1. Skeleton: Vertebral body heights alignment are maintained. No focal lytic or blastic lesions are present. Other neck: The soft tissues the neck are otherwise unremarkable. No focal mucosal or submucosal lesions are present. Salivary glands are within normal limits. No significant adenopathy is present. Thyroid is normal. Upper chest: Next mild dependent atelectasis is present. The lung apices are otherwise clear. Thoracic inlet is within normal limits. Review of the MIP images confirms the above findings CTA HEAD FINDINGS Anterior circulation: Atherosclerotic calcifications are present within the cavernous internal carotid arteries bilaterally without a significant stenosis through the ICA termini. The left A1 is hypoplastic. The right A1 is normal. The anterior communicating artery is patent. ACA branch vessels are within normal limits bilaterally. There is a high-grade stenosis of the anterior right M2 segment with marked attenuation of distal branches. Diffuse irregularity present and more posterior left MCA branches. There is moderate irregularity in left MCA branches. Pial collaterals are evident. Posterior circulation: The right vertebral artery is the dominant vessel. Segmental irregularity is present in the left V4 segment without a significant stenosis. PICA origins are visualized and normal. The vertebrobasilar junction is normal. The basilar artery is normal. Both posterior cerebral arteries originate from the basilar tip. There is some irregularity of the proximal PCA vessels without significant proximal stenosis. Branch vessels are intact. Venous sinuses: The dural sinuses are patent. Anatomic variants: None Delayed phase: No pathologic enhancement is present. Infarcts are well-defined. Review of the MIP images confirms the above findings IMPRESSION: 1. High-grade bilateral proximal ICA stenoses at the carotid bifurcations. 2. 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 3. Hypoplastic left A1 segment. 4. High-grade stenosis of the anterior right M2 segment. 5. Moderate diffuse medium and distal small vessel disease in both the anterior and posterior circulations. 6. Expected evolution of left internal capsule nonhemorrhagic infarct. 7. 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 Electronically Signed   By: Marin Roberts M.D.   On: 02/28/2019 21:01   [name]  Result Date: 02/21/2019 CLINICAL DATA:  Focal neuro deficit for greater than 6 hours. Altered level of consciousness. EXAM: MRI HEAD WITHOUT CONTRAST TECHNIQUE: Multiplanar, multiecho pulse sequences of the brain and surrounding structures were obtained without intravenous contrast. COMPARISON:  CT head without contrast 01/29/2019 FINDINGS: Brain: The diffusion-weighted images confirm an acute nonhemorrhagic infarct involving the genu of the left internal capsule and globus pallidus. T2 signal changes are associated with the acute infarct, consistent with the subacute time frame. Remote encephalomalacia is again noted right MCA territory. The ventricles are of proportionate to the degree of atrophy. Wallerian degeneration is present in the right cerebral peduncle extending into the pons. Cerebellum is normal. No significant extraaxial fluid collection is present. Vascular: Flow is present in the major intracranial arteries. Skull and upper cervical spine: The craniocervical junction is normal. Upper cervical spine is within normal limits. Marrow signal is unremarkable. Sinuses/Orbits: The paranasal sinuses and mastoid air cells are clear. Bilateral lens replacements are noted. The globes and orbits are within normal limits. IMPRESSION: 1. 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 This is consistent with a time frame of greater than 6 hours. 2. Remote encephalomalacia of the right MCA territory with  associated wallerian degeneration. Electronically Signed  By: Marin Roberts M.D.   On: 02/24/2019 19:18   Dg Foot Complete Right  Result Date: 02/20/2019 CLINICAL DATA:  Right foot pain EXAM: RIGHT FOOT COMPLETE - 3+ VIEW COMPARISON:  None. FINDINGS: Comminuted fracture is noted at the base of the first distal phalanx. This extends into the articular surface in multiple locations. No other fracture is seen. Soft tissue swelling is noted. IMPRESSION: Comminuted fracture of the first distal phalanx which extends to the articular surface. Electronically Signed   By: Alcide Clever M.D.   On: 02/08/2019 16:01   Vas [place] Vanice Sarah With/wo Tbi  Result Date: 03/01/2019 LOWER EXTREMITY DOPPLER STUDY Indications: Gangrene. High Risk Factors: Current smoker.  Comparison Study: No prior study on file Performing Technologist: Sherren Kerns RVS  Examination Guidelines: A complete evaluation includes at minimum, Doppler waveform signals and systolic blood pressure reading at the level of bilateral brachial, anterior tibial, and posterior tibial arteries, when vessel segments are accessible. Bilateral testing is considered an integral part of a complete examination. Photoelectric Plethysmograph (PPG) waveforms and toe systolic pressure readings are included as required and additional duplex testing as needed. Limited examinations for reoccurring indications may be performed as noted.  ABI Findings: +---------+------------------+-----+-------------------+-----------------------+ Right    Rt Pressure (mmHg)IndexWaveform           Comment                 +---------+------------------+-----+-------------------+-----------------------+ Brachial 183                    triphasic                                  +---------+------------------+-----+-------------------+-----------------------+ PTA      67                0.37 dampened monophasic                         +---------+------------------+-----+-------------------+-----------------------+ DP       74                0.40 dampened monophasic                        +---------+------------------+-----+-------------------+-----------------------+ Great Toe                                          Not done secondary to                                                      gangrene                +---------+------------------+-----+-------------------+-----------------------+ +--------+------------------+-----+---------+-------+ Left    Lt Pressure (mmHg)IndexWaveform Comment +--------+------------------+-----+---------+-------+ [id number]                    triphasic        +--------+------------------+-----+---------+-------+ PTA     185               1.01 biphasic         +--------+------------------+-----+---------+-------+ DP      176  0.96 biphasic         +--------+------------------+-----+---------+-------+ +-------+-----------+-----------+------------+------------+ ABI/TBIToday's ABIToday's TBIPrevious ABIPrevious TBI +-------+-----------+-----------+------------+------------+ Right  0.40                                           +-------+-----------+-----------+------------+------------+ Left   1.01                                           +-------+-----------+-----------+------------+------------+  Summary: Right: 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 Left: Resting left ankle-brachial index is within normal range. No evidence of significant left lower extremity arterial disease.  *See table(s) above for measurements and observations.  Electronically signed by Coral Else MD on 03/01/2019 at 10:49:49 AM.    PHYSICAL EXAM Temp:  [98.3 F (36.8 C)-98.6 F (37 C)] 98.6 F (37 C) (03/23 0413) Pulse Rate:  [76-96] 76 (03/23 0413) Resp:  [14-16] 14 (03/23 0413) BP: (168-184)/(98-101)  168/98 (03/23 0413) SpO2:  [95 %-97 %] 95 % (03/23 0413)  General - Well nourished, well developed, in no apparent distress.  Ophthalmologic - fundi not visualized due to noncooperation.  Cardiovascular - Regular rate and rhythm.  Mental Status -  Level of arousal and orientation to self, place, and person were intact, however not orientated to age or time. Language including expression, repetition, comprehension was assessed and found intact, mild dysarthria, paucity of speech, naming 2 out of 3  Cranial Nerves II - XII - II - Visual field intact OU. III, IV, VI - Extraocular movements intact. V - Facial sensation intact bilaterally. VII - Facial movement intact bilaterally. VIII - Hearing & vestibular intact bilaterally. X - Palate elevates symmetrically. XI - Chin turning & shoulder shrug intact bilaterally. XII - Tongue protrusion intact.  Motor Strength - The patient's strength was normal in all extremities and pronator drift was absent except mildly limited ROM of left shoulder.  Bulk was normal and fasciculations were absent.   Motor Tone - Muscle tone was assessed at the neck and appendages and was normal.  Reflexes - The patient's reflexes were symmetrical in all extremities and he had no pathological reflexes.  Sensory - Light touch, temperature/pinprick were assessed and were symmetrical.    Coordination - The patient had normal movements in the hands with no ataxia or dysmetria.  Tremor was absent.  Gait and Station - deferred.   ASSESSMENT/PLAN Noah Thomas is a 58 y.o. male with history of tobacco use and mental health issues presenting with altered mental status. Found to have R toe cellulitis. 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 Prior R MCA infarct w/ encephalomalacia. Subacute to chronic L internal  capsule/BG infarct.  MRI  Acute/subacute infarct L internal capsule/BG. 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 Mod diffuse medium and distal small stenosis anterior and posterior circulation. Expected evolution L IC infarct. chronic R MCA encephalomalacia   2D Echo pending   LDL 81  HgbA1c 7.7  UDS pending  Lovenox 40 mg sq daily for VTE prophylaxis  No  antithrombotic prior to admission, now on aspirin 81 mg daily. Given mild stroke, recommend aspirin 81 mg and plavix 75 mg daily x 3 weeks, then aspirin alone. Will hold  plavix for now given planned OR.   Therapy recommendations:  SNF  Disposition:  pending (currently lives in a mobile home that is being condemned. Does not have insurance for SNF. Plans to go home with brother, who was educated on APS resources)  Carotid stenosis  CTA head and neck showed bilateral high-grade stenosis at ICA bifurcation  Left ICA high-grade stenosis likely the etiology of current stroke  Right ICA high-grade stenosis likely to explain his right MCA encephalomalacia and right M2 stenosis  Recommend VVS consult for left ICA high grade stenosis  Hypertension  Stable, 160-180s . Permissive hypertension (OK if < 220/120) but gradually normalize in 5-7 days . Long-term BP goal 130-150 given b/l ICA high grade stenosis  Hyperlipidemia  Home meds:  No statin  LDL 81, goal < 70  Hold statin for now given elevated LFTs  Consider statin once LFTs improve  Diabetes type II  HgbA1c 7.7, goal < 7.0  Uncontrolled  SSI   CBG   Close PCP follow-up and better DM control.  Right toe gangrene and abscess pending amputation tomorrow.  Tobacco abuse  Current smoker  Smoking cessation counseling provided  Nicotine patch provided  Pt is willing to quit  Other Stroke Risk Factors  ETOH use, advised to drink no more than 2 drink(s) a day  Hx drug use, UDS  pending    PAD - Vas UA ABI severe RLE disease - VVS on board  Other Active Problems  R toe gangrene and abscess, Dr. Lajoyce Corners plans amputation Tues am.  Hospital day # 2  I spent  35 minutes in total face-to-face time with the patient, more than 50% of which was spent in counseling and coordination of care, reviewing test results, images and medication, and discussing the diagnosis of left BG infarct, right MCA encephalomalacia, bilateral ICA high-grade stenosis, right toe gangrene with sepsis, severe right lower extremity PAD, treatment plan and potential prognosis. This patient's care requiresreview of multiple databases, neurological assessment, discussion with family, other specialists and medical decision making of high complexity.  I also discussed with Dr. Arlean Hopping and vascular surgery PA.  Marvel Plan, MD PhD Stroke Neurology 03/01/2019 3:52 PM     To contact Stroke Continuity provider, please refer to [email or web address]. [id number] [phone], contact [facility]

## 2019-03-01 NOTE — Progress Notes (Addendum)
PROGRESS NOTE    Noah Thomas  ZOX:[id number] DOB: 1961-01-02 DOA: 03/22/2019 PCP: Patient, No Pcp Per  Brief Narrative:58 year old male, lives alone in a mobile home and independent, has not seen a physician in a long time, PMH of significant longstanding mental health issues, not on prescription medications, tobacco abuse, presented to [facility] ED on 03/13/2019 via EMS with altered mental status.  History obtained from brother.  Brother indicates that patient was normal until his teenage years when he started becoming paranoid and agitated while living with his parents, eventually had to be placed in [facility].  Over the years patient has calmed down, able to go to the grocery store for food, cigarettes but has no insight and unable to truly care for himself.  He lives in [place] conditions to an extent that when EMS went to retrieve him today, authorities were planning to "condemn" his trailer home.  Patient's brother has given him a debit card and provides with the finances.  Patient last visited his brother on [birth year] his birthday at which point he was in his usual state of health except he complained of some toe pain but was managing to ambulate.  Since then the brother was trying to reach him by phone for several days and went over to his house yesterday but did not get an answer at the door.  He also noticed decrease pending on the debit card that he had provided.  When he returned today he heard some soft noises in the house and called 911 and EMS found the patient on the ground in filthy condition, confused.  Patient is unable to provide any history.  He cannot tell me why he is in the hospital.  At one point he said he was here to get "checked".  ED Course: Noted to be in filthy condition, tachycardic, not febrile, not hypotensive, leukocytosis, elevated lactate, right toe exam suggestive of possible cellulitis, initiated sepsis protocol and received IV fluids and broad-spectrum IV antibiotics.   Orthopedics consulted by EDP and will see him in the morning.  WBC 14.6, hemoglobin 18.4, glucose 211, BUN 33, AST 136, ALT 69, total bilirubin 1.5, CK 3000 323, troponin 0 0.03, urine microscopy not suggestive of UTI, chest x-ray negative, CT head suggests subacute to chronic ischemia, right foot x-ray showed comminuted fracture of the first distal phalanx.  Assessment & Plan:   Active Problems:   Acute metabolic encephalopathy   Toe fracture, right 1st with cellulitis and?  Gangrene versus hematoma.   Rhabdomyolysis   Dehydration   Cellulitis   Sepsis (HCC)   Adult failure to thrive   Osteomyelitis of great toe of right foot (HCC)   Gangrene of toe of right foot (HCC)   Moderate protein-calorie malnutrition (HCC)   Smoking   Somnolence   Cerebral thrombosis with cerebral infarction   #1 R great toe gangrene/ abscess: with comminuted fracture of the right first distal phalanx extending to the articular surface-continue Vanco and cefepime empirically.  Patient to have toe amputation on Tuesday per Dr Lajoyce Corners.  #2 acute metabolic encephalopathy: secondary to combination of stroke sepsis and dehydration.  Patient pulled out all his IVs overnight. Sitter ordered so he will not pull out any more lines.  Still appears to be dehydrated.  Ammonia level normal TSH normal.  MRI of the brain acute to subacute nonhemorrhagic infarct involving the left internal capsule and basal ganglia with the timeframe of greater than 6 hours.  Remote encephalomalacia of the right  MCA territory with associated wallerian degeneration. - seen by neurology, appreciate Rec's - has sig bilat ICA stenosis, neuro said they will consult [facility] for this  #3 rhabdomyolysis: mild, resolving w/ IVF's. Pt was found down.   #4 type 2 diabetes: new diagnosis. Hemoglobin A1c 7.7 SSI for now patient does not take any medications at home.  - seen by Diab team, will start on metformin at time of dc - for now use SSI  #5 elevated LFTs:  improving continue IV fluids and monitor. Get RUQ [place].  #6 generalized weakness: PT OT consult patient most likely will need placement at this time he is unable to take care of himself and live alone.  Prior to admission he lived in a trailer at home alone. Sounds like there is underlying mental illness, ?schizophrenia.   #7 acute CVA: involving the left internal capsule and basal ganglia.  PT OT speech consult.  His hemoglobin A1c is 7.7 obtain lipid panel.  TSH 0.664 - seen by neurology, appreciate rec's   DVT prophylaxis: Lovenox Code Status: Full code (Full/Partial - specify details) Family Communication: None available Disposition Plan: Pending further clinical progress Consultants:   Dr. Burnett Sheng / [facility] [phone] 03/01/2019, 4:24 PM   Procedures: None Antimicrobials: Vanco and cefepime received a dose of Flagyl in the ER  Subjective: Patient was up walking the halls. Earlier he pulled out a PICC line I believe.   Objective: Vitals:   02/28/19 1411 02/28/19 2030 03/01/19 0413 03/01/19 1521  BP: (!) 179/101 (!) 184/101 (!) 168/98 (!) 159/97  Pulse: 96 89 76 96  Resp: 16 14 14 16   Temp: 98.4 F (36.9 C) 98.3 F (36.8 C) 98.6 F (37 C) 98.6 F (37 C)  TempSrc: Oral Oral Oral Oral  SpO2: 97% 97% 95% 98%  Weight:      Height:        Intake/Output Summary (Last 24 hours) at 03/01/2019 1616 Last data filed at 03/01/2019 1500 Gross per 24 hour  Intake 1624.38 ml  Output --  Net 1624.38 ml   Filed Weights   03/07/2019 1454  Weight: 81.6 kg    Examination:  General exam: Appears calm and comfortable  Respiratory system: Clear to auscultation. Respiratory effort normal. Cardiovascular system: S1 & S2 heard, RRR. No JVD, murmurs, rubs, gallops or clicks. No pedal edema. Gastrointestinal system: Abdomen is nondistended, soft and nontender. No organomegaly or masses felt. Normal bowel sounds heard. Central nervous system: He is awake unable to tell me where he  is Extremities: Right big toe bruised and black in color and right foot swollen erythematous tender Skin: No rashes, lesions or ulcers    Data Reviewed: I have personally reviewed following labs and imaging studies  CBC: Recent Labs  Lab 02/28/2019 1450 02/28/19 0338 03/01/19 0348  WBC 14.6* 13.9* 12.4*  NEUTROABS 10.9*  --  7.7  HGB 18.4* 16.8 15.5  HCT 55.4* 49.3 46.9  MCV 88.4 88.5 89.2  PLT 314 274 259   Basic Metabolic Panel: Recent Labs  Lab 02/07/2019 1450 02/28/19 0338 03/01/19 0348  NA 141 141 140  K 4.1 3.8 3.5  CL 101 105 103  CO2 23 23 25   GLUCOSE 211* 213* 123*  BUN 33* 25* 16  CREATININE 1.11 1.15 0.99  CALCIUM 9.7 8.9 8.6*   GFR: Estimated Creatinine Clearance: 89.3 mL/min (by C-G formula based on SCr of 0.99 mg/dL). Liver Function Tests: Recent Labs  Lab 03/09/2019 1450 02/28/19 7591 03/01/19 0348  AST 136* 112* 97*  ALT 69* 67* 65*  ALKPHOS 98 79 75  BILITOT 1.5* 0.9 0.6  PROT 7.5 6.2* 6.1*  ALBUMIN 3.7 3.1* 2.9*   Recent Labs  Lab 02/18/2019 1450  LIPASE 36   Recent Labs  Lab 02/22/2019 1519  AMMONIA 25   Coagulation Profile: No results for input(s): INR, PROTIME in the last 168 hours. Cardiac Enzymes: Recent Labs  Lab 02/23/2019 1450 03/06/2019 2235 02/28/19 0338 02/28/19 1004 03/01/19 0348  CKTOTAL 3,323*  --  2,223*  --  1,051*  TROPONINI 0.03* <0.03 0.06* 0.03*  --    BNP (last 3 results) No results for input(s): PROBNP in the last 8760 hours. HbA1C: Recent Labs    03/06/2019 1450  HGBA1C 7.7*   CBG: Recent Labs  Lab 02/08/2019 1933 02/28/19 1231 02/28/19 1618 03/01/19 0905 03/01/19 1239  GLUCAP 193* 350* 127* 117* 121*   Lipid Profile: Recent Labs    03/01/19 0348  CHOL 136  HDL 20*  LDLCALC 81  TRIG 409176*  CHOLHDL 6.8   Thyroid Function Tests: Recent Labs    02/18/2019 1450  TSH 0.664   Anemia Panel: No results for input(s): VITAMINB12, FOLATE, FERRITIN, TIBC, IRON, RETICCTPCT in the last 72 hours. Sepsis  Labs: Recent Labs  Lab 02/25/2019 1450 02/26/2019 1801  LATICACIDVEN 2.3* 2.0*    Recent Results (from the past 240 hour(s))  Urine culture     Status: None   Collection Time: 02/25/2019  4:04 PM  Result Value Ref Range Status   Specimen Description URINE, CATHETERIZED  Final   Special Requests Normal  Final   Culture   Final    NO GROWTH Performed at [facility], [address] [place], [place] [id number]    Report Status 02/28/2019 FINAL  Final         Radiology Studies: Ct Angio Head W Or Wo Contrast  Result Date: 02/28/2019 CLINICAL DATA:  Stroke follow-up. 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 EXAM: CT ANGIOGRAPHY HEAD AND NECK TECHNIQUE: Multidetector CT imaging of the head and neck was performed using the standard protocol during bolus administration of intravenous contrast. Multiplanar CT image reconstructions and MIPs were obtained to evaluate the vascular anatomy. Carotid stenosis measurements (when applicable) are obtained utilizing NASCET criteria, using the distal internal carotid diameter as the denominator. CONTRAST:  75mL ISOVUE-370 IOPAMIDOL (ISOVUE-370) INJECTION 76% COMPARISON:  MRI of the brain 02/21/2019 FINDINGS: CT HEAD FINDINGS Brain: The left internal capsule infarct is again noted, now slightly lower density than on the previous CT. No new infarct is present. The remote right MCA territory encephalomalacia is stable. The ventricles are of proportionate to the degree of atrophy. No significant extraaxial fluid collection is present. The brainstem and cerebellum are within normal limits. Vascular: Atherosclerotic calcifications are present within the cavernous internal carotid arteries bilaterally. There is no hyperdense vessel. Skull: The craniocervical junction is normal. Upper cervical spine is within normal limits. Marrow signal is unremarkable. Sinuses: Scattered opacification of left ethmoid air cells are  present. There are no fluid levels. The paranasal sinuses and mastoid air cells are otherwise clear. Orbits: The globes and orbits are within normal limits. Review of the MIP images confirms the above findings CTA NECK FINDINGS Aortic arch: A 3 vessel arch configuration is present. Minimal atherosclerotic changes are present at the great vessel origins. There is no significant stenosis or aneurysm. Right carotid system: The right common carotid artery is within normal limits. Dense  calcifications are present at the right carotid bifurcation. There is a high-grade stenosis at the carotid bifurcation. Lumen is narrowed to less than 1 mm. The more distal right common carotid artery is within normal limits to the skull base. Left carotid system: The left common carotid artery demonstrates some atherosclerotic irregularity. There is a high-grade, near occlusive stenosis of the left internal carotid artery at its bifurcation. The cervical left ICA is otherwise normal. Vertebral arteries: Extensive atherosclerotic calcifications are present along the vertebral arteries bilaterally. The right vertebral artery is dominant. There is a high-grade stenosis at the proximal left vertebral artery. No significant stenosis is present in the right vertebral artery. The left vertebral artery is reconstituted at the distal V1 segment. Extensive atherosclerotic changes are present throughout the V2 segment. These are high-grade stenosis at the level of C1. Skeleton: Vertebral body heights alignment are maintained. No focal lytic or blastic lesions are present. Other neck: The soft tissues the neck are otherwise unremarkable. No focal mucosal or submucosal lesions are present. Salivary glands are within normal limits. No significant adenopathy is present. Thyroid is normal. Upper chest: Next mild dependent atelectasis is present. The lung apices are otherwise clear. Thoracic inlet is within normal limits. Review of the MIP images  confirms the above findings CTA HEAD FINDINGS Anterior circulation: Atherosclerotic calcifications are present within the cavernous internal carotid arteries bilaterally without a significant stenosis through the ICA termini. The left A1 is hypoplastic. The right A1 is normal. The anterior communicating artery is patent. ACA branch vessels are within normal limits bilaterally. There is a high-grade stenosis of the anterior right M2 segment with marked attenuation of distal branches. Diffuse irregularity present and more posterior left MCA branches. There is moderate irregularity in left MCA branches. Pial collaterals are evident. Posterior circulation: The right vertebral artery is the dominant vessel. Segmental irregularity is present in the left V4 segment without a significant stenosis. PICA origins are visualized and normal. The vertebrobasilar junction is normal. The basilar artery is normal. Both posterior cerebral arteries originate from the basilar tip. There is some irregularity of the proximal PCA vessels without significant proximal stenosis. Branch vessels are intact. Venous sinuses: The dural sinuses are patent. Anatomic variants: None Delayed phase: No pathologic enhancement is present. Infarcts are well-defined. Review of the MIP images confirms the above findings IMPRESSION: 1. High-grade bilateral proximal ICA stenoses at the carotid bifurcations. 2. 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 3. Hypoplastic left A1 segment. 4. High-grade stenosis of the anterior right M2 segment. 5. Moderate diffuse medium and distal small vessel disease in both the anterior and posterior circulations. 6. Expected evolution of left internal capsule nonhemorrhagic infarct. 7. 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 Electronically Signed   By: Marin Roberts M.D.   On: 02/28/2019 21:01   Ct Angio Neck W Or Wo Contrast  Result  Date: 02/28/2019 CLINICAL DATA:  Stroke follow-up. 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 EXAM: CT ANGIOGRAPHY HEAD AND NECK TECHNIQUE: Multidetector CT imaging of the head and neck was performed using the standard protocol during bolus administration of intravenous contrast. Multiplanar CT image reconstructions and MIPs were obtained to evaluate the vascular anatomy. Carotid stenosis measurements (when applicable) are obtained utilizing NASCET criteria, using the distal internal carotid diameter as the denominator. CONTRAST:  75mL ISOVUE-370 IOPAMIDOL (ISOVUE-370) INJECTION 76% COMPARISON:  MRI of the brain 02/23/2019 FINDINGS: CT HEAD FINDINGS Brain: The left  internal capsule infarct is again noted, now slightly lower density than on the previous CT. No new infarct is present. The remote right MCA territory encephalomalacia is stable. The ventricles are of proportionate to the degree of atrophy. No significant extraaxial fluid collection is present. The brainstem and cerebellum are within normal limits. Vascular: Atherosclerotic calcifications are present within the cavernous internal carotid arteries bilaterally. There is no hyperdense vessel. Skull: The craniocervical junction is normal. Upper cervical spine is within normal limits. Marrow signal is unremarkable. Sinuses: Scattered opacification of left ethmoid air cells are present. There are no fluid levels. The paranasal sinuses and mastoid air cells are otherwise clear. Orbits: The globes and orbits are within normal limits. Review of the MIP images confirms the above findings CTA NECK FINDINGS Aortic arch: A 3 vessel arch configuration is present. Minimal atherosclerotic changes are present at the great vessel origins. There is no significant stenosis or aneurysm. Right carotid system: The right common carotid artery is within normal limits. Dense calcifications are present at the right carotid bifurcation.  There is a high-grade stenosis at the carotid bifurcation. Lumen is narrowed to less than 1 mm. The more distal right common carotid artery is within normal limits to the skull base. Left carotid system: The left common carotid artery demonstrates some atherosclerotic irregularity. There is a high-grade, near occlusive stenosis of the left internal carotid artery at its bifurcation. The cervical left ICA is otherwise normal. Vertebral arteries: Extensive atherosclerotic calcifications are present along the vertebral arteries bilaterally. The right vertebral artery is dominant. There is a high-grade stenosis at the proximal left vertebral artery. No significant stenosis is present in the right vertebral artery. The left vertebral artery is reconstituted at the distal V1 segment. Extensive atherosclerotic changes are present throughout the V2 segment. These are high-grade stenosis at the level of C1. Skeleton: Vertebral body heights alignment are maintained. No focal lytic or blastic lesions are present. Other neck: The soft tissues the neck are otherwise unremarkable. No focal mucosal or submucosal lesions are present. Salivary glands are within normal limits. No significant adenopathy is present. Thyroid is normal. Upper chest: Next mild dependent atelectasis is present. The lung apices are otherwise clear. Thoracic inlet is within normal limits. Review of the MIP images confirms the above findings CTA HEAD FINDINGS Anterior circulation: Atherosclerotic calcifications are present within the cavernous internal carotid arteries bilaterally without a significant stenosis through the ICA termini. The left A1 is hypoplastic. The right A1 is normal. The anterior communicating artery is patent. ACA branch vessels are within normal limits bilaterally. There is a high-grade stenosis of the anterior right M2 segment with marked attenuation of distal branches. Diffuse irregularity present and more posterior left MCA branches.  There is moderate irregularity in left MCA branches. Pial collaterals are evident. Posterior circulation: The right vertebral artery is the dominant vessel. Segmental irregularity is present in the left V4 segment without a significant stenosis. PICA origins are visualized and normal. The vertebrobasilar junction is normal. The basilar artery is normal. Both posterior cerebral arteries originate from the basilar tip. There is some irregularity of the proximal PCA vessels without significant proximal stenosis. Branch vessels are intact. Venous sinuses: The dural sinuses are patent. Anatomic variants: None Delayed phase: No pathologic enhancement is present. Infarcts are well-defined. Review of the MIP images confirms the above findings IMPRESSION: 1. High-grade bilateral proximal ICA stenoses at the carotid bifurcations. 2. 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  3. Hypoplastic left A1 segment. 4. High-grade stenosis of the anterior right M2 segment. 5. Moderate diffuse medium and distal small vessel disease in both the anterior and posterior circulations. 6. Expected evolution of left internal capsule nonhemorrhagic infarct. 7. 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 Electronically Signed   By: Marin Roberts M.D.   On: 02/28/2019 21:01   [name]  Result Date: March 10, 2019 CLINICAL DATA:  Focal neuro deficit for greater than 6 hours. Altered level of consciousness. EXAM: MRI HEAD WITHOUT CONTRAST TECHNIQUE: Multiplanar, multiecho pulse sequences of the brain and surrounding structures were obtained without intravenous contrast. COMPARISON:  CT head without contrast 01/29/2019 FINDINGS: Brain: The diffusion-weighted images confirm an acute nonhemorrhagic infarct involving the genu of the left internal capsule and globus pallidus. T2 signal changes are associated with the acute infarct, consistent with the subacute  time frame. Remote encephalomalacia is again noted right MCA territory. The ventricles are of proportionate to the degree of atrophy. Wallerian degeneration is present in the right cerebral peduncle extending into the pons. Cerebellum is normal. No significant extraaxial fluid collection is present. Vascular: Flow is present in the major intracranial arteries. Skull and upper cervical spine: The craniocervical junction is normal. Upper cervical spine is within normal limits. Marrow signal is unremarkable. Sinuses/Orbits: The paranasal sinuses and mastoid air cells are clear. Bilateral lens replacements are noted. The globes and orbits are within normal limits. IMPRESSION: 1. 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 This is consistent with a time frame of greater than 6 hours. 2. 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 Electronically Signed   By: Marin Roberts M.D.   On: 03/20/2019 19:18   Vas [place] Vanice Sarah With/wo Tbi  Result Date: 03/01/2019 LOWER EXTREMITY DOPPLER STUDY Indications: Gangrene. High Risk Factors: Current smoker.  Comparison Study: No prior study on file Performing Technologist: Sherren Kerns RVS  Examination Guidelines: A complete evaluation includes at minimum, Doppler waveform signals and systolic blood pressure reading at the level of bilateral brachial, anterior tibial, and posterior tibial arteries, when vessel segments are accessible. Bilateral testing is considered an integral part of a complete examination. Photoelectric Plethysmograph (PPG) waveforms and toe systolic pressure readings are included as required and additional duplex testing as needed. Limited examinations for reoccurring indications may be performed as noted.  ABI Findings: +---------+------------------+-----+-------------------+-----------------------+  Right     Rt Pressure (mmHg) Index Waveform            Comment                   +---------+------------------+-----+-------------------+-----------------------+  Brachial  183                      triphasic                                    +---------+------------------+-----+-------------------+-----------------------+  PTA       67                 0.37  dampened monophasic                          +---------+------------------+-----+-------------------+-----------------------+  DP        74                 0.40  dampened monophasic                          +---------+------------------+-----+-------------------+-----------------------+  Great Toe                                              Not done secondary to                                                            gangrene                 +---------+------------------+-----+-------------------+-----------------------+ +--------+------------------+-----+---------+-------+  Left     Lt Pressure (mmHg) Index Waveform  Comment  +--------+------------------+-----+---------+-------+  Brachial 171                      triphasic          +--------+------------------+-----+---------+-------+  PTA      185                1.01  biphasic           +--------+------------------+-----+---------+-------+  DP       176                0.96  biphasic           +--------+------------------+-----+---------+-------+ +-------+-----------+-----------+------------+------------+  ABI/TBI Today's ABI Today's TBI Previous ABI Previous TBI  +-------+-----------+-----------+------------+------------+  Right   0.40                                               +-------+-----------+-----------+------------+------------+  Left    1.01                                               +-------+-----------+-----------+------------+------------+  Summary: Right: 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 Left: Resting left ankle-brachial index is within normal range. No evidence of significant left lower  extremity arterial disease.  *See table(s) above for measurements and observations.  Electronically signed by Coral Else MD on 03/01/2019 at 10:49:49 AM.    Final         Scheduled Meds:  aspirin EC  81 mg Oral Daily   enoxaparin (LOVENOX) injection  40 mg Subcutaneous Q24H   folic acid  1 mg Oral Daily   insulin aspart  0-9 Units Subcutaneous TID WC   multivitamin with minerals  1 tablet Oral Daily   nicotine  21 mg Transdermal Daily   sodium chloride flush  3 mL Intravenous Q12H   thiamine  100 mg Oral Daily   Continuous Infusions:  sodium chloride 100 mL/hr at 03/01/19 1217   ceFEPime (MAXIPIME) IV 2 g (02/28/19 2320)   banana bag IV 1000 mL     vancomycin 1,000 mg (03/01/19 1220)     LOS: 2 days     Maree Krabbe, MD [facility]  If 7PM-7AM, please contact night-coverage [email or web address] Password [facility] 03/01/2019, 4:16 PM

## 2019-03-01 NOTE — Evaluation (Signed)
Occupational Therapy Evaluation Patient Details Name: Noah Thomas MRN: [id number] DOB: March 11, 1961 Today's Date: 03/01/2019    History of Present Illness 58 year old male, lives alone in a mobile home and independent, has not seen a physician in a long time, PMH of significant longstanding mental health issues, not on prescription medications, tobacco abuse, presented to [facility] ED on 02/21/2019 via EMS with altered mental status.  History obtained from brother.  Brother indicates that patient was normal until his teenage years when he started becoming paranoid and agitated while living with his parents, eventually had to be placed in [facility].  Over the years patient has calmed down, able to go to the grocery store for food, cigarettes but has no insight and unable to truly care for himself.  He lives in [place] conditions to an extent that when EMS went to retrieve him today, authorities were planning to "condemn" his trailer home.  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    Clinical Impression   PT admitted with pending R foot toe amputation 03/05/2019. Pt currently with functional limitiations due to the deficits listed below (see OT problem list). Pt currently max (A) for bed log rolling. Pt more aroused once repositioned with chair position in bed to complete self feeding.  Pt will benefit from skilled OT to increase their independence and safety with adls and balance to allow discharge SNF.     Follow Up Recommendations  SNF    Equipment Recommendations  3 in 1 bedside commode;Wheelchair (measurements OT);Wheelchair cushion (measurements OT);Hospital bed    Recommendations for Other Services       Precautions / Restrictions  Precautions Precautions: Fall Precaution Comments: R 1st phalanx fx (pending possible 1st ray amp) Restrictions Weight Bearing Restrictions: No      Mobility Bed Mobility Overal bed mobility: Needs Assistance Bed Mobility: Rolling Rolling: Max assist         General bed mobility comments: pt rolling R and left with (A) to initiate and reach with UB. pt requires Mod (A) to sustain side lying. pt with wounds noted on L side adn various bruiises throughout UB. pt with IV line out on arrival and wet linen so new linen provided  Transfers                      Balance                                           ADL either performed or assessed with clinical judgement   ADL Overall ADL's : Needs assistance/impaired Eating/Feeding: Minimal assistance;Bed level Eating/Feeding Details (indicate cue type and reason): requires positioning and minimla setup on tray to complete task ( few distractions) Grooming: Maximal assistance Grooming Details (indicate cue type and reason): pt with unkept hair from home and currenlty with a glove holding hair. Question need to have family consent to hair cut to keep proper hygiene Upper Body Bathing: Total assistance   Lower Body Bathing: Total assistance   Upper Body Dressing : Total assistance   Lower Body Dressing: Total assistance   Toilet Transfer: Total assistance Toilet Transfer Details (indicate cue type and reason): bed level -  incontinent and unaware           General ADL Comments: pt initially not following commands. pt requires HOB elevated to help with arousal to participate     Vision         Perception     Praxis      Pertinent Vitals/Pain Pain Assessment: No/denies pain     Hand Dominance Right   Extremity/Trunk Assessment Upper Extremity Assessment Upper Extremity Assessment: Generalized weakness   Lower Extremity Assessment Lower Extremity Assessment: Defer to PT evaluation;RLE  deficits/detail RLE Deficits / Details: pending 1 toe amputation tuesday 02/10/2019   Cervical / Trunk Assessment Cervical / Trunk Assessment: Normal   Communication Communication Communication: No difficulties   Cognition Arousal/Alertness: Lethargic Behavior During Therapy: Flat affect Overall Cognitive Status: No family/caregiver present to determine baseline cognitive functioning                                 General Comments: poor following commands and limited responses. pt declines food but when positioned with food begins to eat   General Comments       Exercises     Shoulder Instructions      Home Living Family/patient expects to be discharged to:: Shelter/Homeless                                 Additional Comments: essentially homeless due to mobile home being condemned.       Prior Functioning/Environment Level of Independence: Independent                 OT Problem List: Decreased strength;Decreased activity tolerance;Impaired balance (sitting and/or standing);Decreased cognition;Decreased safety awareness;Decreased coordination;Decreased knowledge of use of DME or AE;Decreased knowledge of precautions;Cardiopulmonary status limiting activity;Obesity;Impaired UE functional use;Pain      OT Treatment/Interventions: Self-care/ADL training;Therapeutic exercise;Neuromuscular education;Energy conservation;DME and/or AE instruction;Manual therapy;Therapeutic activities;Cognitive remediation/compensation;Patient/family education;Balance training    OT Goals(Current goals can be found in the care plan section) Acute Rehab OT Goals Patient Stated Goal: not stated OT Goal Formulation: Patient unable to participate in goal setting Time For Goal Achievement: 03/15/19 Potential to Achieve Goals: Good  OT Frequency: Min 2X/week   Barriers to D/C: Decreased caregiver support          Co-evaluation              AM-PAC OT "6  Clicks" Daily Activity     Outcome Measure Help from another person eating meals?: A Little Help from another person taking care of personal grooming?: A Lot Help from another person toileting, which includes using toliet, bedpan, or urinal?: Total Help from another person bathing (including washing, rinsing, drying)?: Total Help from another person to put on and taking off regular upper body clothing?: A Lot Help from another person to put on and taking off regular lower body clothing?: Total 6 Click Score: 10   End of Session Nurse Communication: Mobility status;Precautions  Activity Tolerance: Patient tolerated treatment well Patient left: in bed;with call bell/phone within reach;Other (comment)(eating lunch in chair position)  OT Visit Diagnosis: Unsteadiness on feet (R26.81);Repeated falls (R29.6);Cognitive communication deficit (R41.841)                Time: 8119-1478 OT Time Calculation (min): 16 min Charges:  OT General Charges $OT Visit: 1 Visit OT Evaluation $OT Eval Moderate Complexity: 1 Mod   Brynn  Yetta Barre, OTR/L  Acute Rehabilitation Services Pager: [phone] Office: [phone] .   Mateo Flow 03/01/2019, 2:04 PM

## 2019-03-01 NOTE — Progress Notes (Signed)
Physical Therapy Treatment Patient Details Name: Noah Thomas MRN: [id number] DOB: [birth year] Today's Date: 03/01/2019    History of Present Illness 58 year old male, lives alone in a mobile home and independent, has not seen a physician in a long time, PMH of significant longstanding mental health issues, not on prescription medications, tobacco abuse, presented to [facility] ED on 03/04/2019 via EMS with altered mental status.  History obtained from brother.  Brother indicates that patient was normal until his teenage years when he started becoming paranoid and agitated while living with his parents, eventually had to be placed in [facility].  Over the years patient has calmed down, able to go to the grocery store for food, cigarettes but has no insight and unable to truly care for himself.  He lives in [place] conditions to an extent that when EMS went to retrieve him today, authorities were planning to "condemn" his trailer home.  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     PT Comments    Patient seen for mobility progression. Pt presents with very delayed processing and requires multimodal cues for functional tasks. Pt tolerated gait training well with min guard assist and min/mod A for sit to stand transfers. Continue to progress as tolerated.    Follow Up Recommendations  SNF     Equipment Recommendations  Other (comment)(defer to next venue)    Recommendations for Other Services       Precautions / Restrictions Precautions Precautions: Fall Precaution Comments: R 1st phalanx fx (1st ray amp scheduled for 3/24)    Mobility  Bed Mobility Overal bed mobility: Needs Assistance Bed Mobility: Supine to Sit Rolling: Max  assist   Supine to sit: Mod assist     General bed mobility comments: HOB elevated and use of rails; cues for sequencing and assist to bring hips to EOB; lots of increased time required  Transfers Overall transfer level: Needs assistance Equipment used: Rolling walker (2 wheeled) Transfers: Sit to/from Stand Sit to Stand: Mod assist;Min assist         General transfer comment: mutlimodal cues for safe hand placement; increased time to follow commands; assist to power up into standing; pt likes to count to 3 before standing  Ambulation/Gait Ambulation/Gait assistance: Min guard Gait Distance (Feet): (100 ft X 2 trials with seated break) Assistive device: Rolling walker (2 wheeled) Gait Pattern/deviations: Step-through pattern;Decreased step length - right;Decreased step length - left Gait velocity: decreased   General Gait Details: cues for increased stride length   Stairs             Wheelchair Mobility    Modified Rankin (Stroke Patients Only)       Balance Overall balance assessment: Needs assistance Sitting-balance support: No upper extremity supported;Feet supported Sitting balance-Leahy Scale: Good       Standing balance-Leahy Scale: Fair                              Cognition Arousal/Alertness: Awake/alert Behavior During Therapy: Flat affect Overall Cognitive Status: No family/caregiver present to determine baseline cognitive functioning  General Comments: pt has significant delay in responses       Exercises      General Comments General comments (skin integrity, edema, etc.): pt began unwrapping dressing on R knee and needs cues to keep on       Pertinent Vitals/Pain Pain Assessment: No/denies pain    Home Living Family/patient expects to be discharged to:: Shelter/Homeless               Additional Comments: essentially homeless due to mobile home being condemned.      Prior Function Level of Independence: Independent          PT Goals (current goals can now be found in the care plan section) Acute Rehab PT Goals Patient Stated Goal: not stated Progress towards PT goals: Progressing toward goals    Frequency    Min 3X/week      PT Plan Current plan remains appropriate    Co-evaluation              AM-PAC PT "6 Clicks" Mobility   Outcome Measure  Help needed turning from your back to your side while in a flat bed without using bedrails?: A Little Help needed moving from lying on your back to sitting on the side of a flat bed without using bedrails?: A Little Help needed moving to and from a bed to a chair (including a wheelchair)?: A Little Help needed standing up from a chair using your arms (e.g., wheelchair or bedside chair)?: A Little Help needed to walk in hospital room?: A Little Help needed climbing 3-5 steps with a railing? : A Little 6 Click Score: 18    End of Session Equipment Utilized During Treatment: Gait belt Activity Tolerance: Patient tolerated treatment well Patient left: in chair;with call bell/phone within reach;with chair alarm set Nurse Communication: Mobility status PT Visit Diagnosis: Other abnormalities of gait and mobility (R26.89);Difficulty in walking, not elsewhere classified (R26.2)     Time: 8295-6213 PT Time Calculation (min) (ACUTE ONLY): 36 min  Charges:  $Gait Training: 23-37 mins                     Erline Levine, PTA Acute Rehabilitation Services Pager: [phone] Office: [phone]     Carolynne Edouard 03/01/2019, 3:24 PM

## 2019-03-01 NOTE — Progress Notes (Signed)
Inpatient Diabetes Program Recommendations  AACE/ADA: New Consensus Statement on Inpatient Glycemic Control (2015)  Target Ranges:  Prepandial:   less than 140 mg/dL      Peak postprandial:   less than 180 mg/dL (1-2 hours)      Critically ill patients:  140 - 180 mg/dL   Lab Results  Component Value Date   GLUCAP 117 (H) 03/01/2019   HGBA1C 7.7 (H) 03/01/19    Review of Glycemic Control  Diabetes history: No previous DM diagnosed  Current orders for Inpatient glycemic control: Novolog 0-9 units tid  Inpatient Diabetes Program Recommendations:    A1c obtained was 7.7% on 3/21. Meeting criteria for new DM diagnosis. Patient would benefit from Metformin at time of d/c especially with history of mental health issues. Please inform patient on diagnosis. Will see to educate this admission. May need to see after surgery.   Thanks,  Christena Deem RN, MSN, BC-ADM Inpatient Diabetes Coordinator Team Pager [phone] (8a-5p)

## 2019-03-01 NOTE — Progress Notes (Addendum)
Pt noted with a flat affect. Questions answered with one word(ex. Yes, no. Okay). Pt has a very disheveled appearance. Gown off, bed linens off the bed, very little eye contact. Patient has no IV access at this time. Per notes pt has pulled out all PIV since the ED and pulled out a midline since admission. Per day shift nurse MD is aware.  Pt is calm and quiet at this time. refusing CBG checks. No psych consult noted.         Pt agreed to HS CBG check  Resulted at 96. Pt agreed to be transported for abd. [place]. Spoke with [employer], noted pt tore off armband found in hallway after transport pick pt up.

## 2019-03-01 NOTE — Progress Notes (Signed)
CSW spoke with patient's brother who reports he called APS to speak with a social worker to find patient a home. CSW attempted to educate patient's brother on reasons for calling APS that do not include this current situation. Patient's brother was encouraged by CSW to call Department of Social Services to see what patient qualifies for and to start medicaid application.   Patient's brother confirms that patient's home is being condemned but patient can stay with him after hospital stay.   Patient at this time has no other options as he does not have insurance to go to SNF and brother reports they have no money for out of pocket.   [place], [place] [phone]

## 2019-03-01 NOTE — Progress Notes (Signed)
Patient ID: Noah Thomas, male   DOB: 25-Nov-1961, 58 y.o.   MRN: [id number] Patient ABI is completed which shows significant peripheral vascular disease on the right compared to the left.  He has diminished popliteal and dorsalis pedis pulses.  Strong femoral pulse.  Anticipate patient will need arterial study.  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  Plan for first ray amputation in the morning Tuesday.

## 2019-03-01 NOTE — Progress Notes (Signed)
Limited carotid artery duplex has been completed. Preliminary results can be found in CV Proc through chart review.   03/01/19 6:52 PM Olen Cordial RVT

## 2019-03-01 NOTE — Plan of Care (Signed)
Problem: Education: Goal: Knowledge of General Education information will improve Description Including pain rating scale, medication(s)/side effects and non-pharmacologic comfort measures Outcome: Progressing   Problem: Health Behavior/Discharge Planning: Goal: Ability to manage health-related needs will improve Outcome: Progressing   Problem: Clinical Measurements: Goal: Respiratory complications will improve Outcome: Progressing Goal: Cardiovascular complication will be avoided Outcome: Progressing   Problem: Nutrition: Goal: Adequate nutrition will be maintained Outcome: Progressing   Problem: Coping: Goal: Level of anxiety will decrease Outcome: Progressing   Problem: Elimination: Goal: Will not experience complications related to urinary retention Outcome: Progressing   Problem: Pain Managment: Goal: General experience of comfort will improve Outcome: Progressing   Problem: Safety: Goal: Ability to remain free from injury will improve Outcome: Progressing   Problem: Skin Integrity: Goal: Risk for impaired skin integrity will decrease Outcome: Progressing   

## 2019-03-01 NOTE — H&P (View-Only) (Signed)
Patient ID: Noah Thomas, male   DOB: 07/07/1961, 58 y.o.   MRN: [id number] Patient ABI is completed which shows significant peripheral vascular disease on the right compared to the left.  He has diminished popliteal and dorsalis pedis pulses.  Strong femoral pulse.  Anticipate patient will need arterial study.  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  Plan for first ray amputation in the morning Tuesday. 

## 2019-03-02 ENCOUNTER — Inpatient Hospital Stay (HOSPITAL_COMMUNITY): Payer: Self-pay | Admitting: Anesthesiology

## 2019-03-02 ENCOUNTER — Encounter (HOSPITAL_COMMUNITY): Admission: EM | Disposition: E | Payer: Self-pay | Source: Home / Self Care | Attending: Internal Medicine

## 2019-03-02 ENCOUNTER — Encounter (HOSPITAL_COMMUNITY): Payer: Self-pay | Admitting: General Practice

## 2019-03-02 DIAGNOSIS — I6381 Other cerebral infarction due to occlusion or stenosis of small artery: Secondary | ICD-10-CM

## 2019-03-02 DIAGNOSIS — L03031 Cellulitis of right toe: Secondary | ICD-10-CM

## 2019-03-02 DIAGNOSIS — F99 Mental disorder, not otherwise specified: Secondary | ICD-10-CM

## 2019-03-02 DIAGNOSIS — Z89411 Acquired absence of right great toe: Secondary | ICD-10-CM

## 2019-03-02 HISTORY — PX: AMPUTATION TOE: SHX6595

## 2019-03-02 HISTORY — PX: AMPUTATION: SHX166

## 2019-03-02 LAB — CBC
HCT: 48.2 % (ref 39.0–52.0)
Hemoglobin: 16.9 g/dL (ref 13.0–17.0)
MCH: 30.3 pg (ref 26.0–34.0)
MCHC: 35.1 g/dL (ref 30.0–36.0)
MCV: 86.5 fL (ref 80.0–100.0)
Platelets: 219 10*3/uL (ref 150–400)
RBC: 5.57 MIL/uL (ref 4.22–5.81)
RDW: 12.7 % (ref 11.5–15.5)
WBC: 12.9 10*3/uL — AB (ref 4.0–10.5)
nRBC: 0 % (ref 0.0–0.2)

## 2019-03-02 LAB — GLUCOSE, CAPILLARY
GLUCOSE-CAPILLARY: 131 mg/dL — AB (ref 70–99)
Glucose-Capillary: 129 mg/dL — ABNORMAL HIGH (ref 70–99)
Glucose-Capillary: 132 mg/dL — ABNORMAL HIGH (ref 70–99)
Glucose-Capillary: 147 mg/dL — ABNORMAL HIGH (ref 70–99)
Glucose-Capillary: 177 mg/dL — ABNORMAL HIGH (ref 70–99)
Glucose-Capillary: 164 mg/dL — ABNORMAL HIGH (ref 70–99)

## 2019-03-02 LAB — BASIC METABOLIC PANEL
Anion gap: 10 (ref 5–15)
BUN: 13 mg/dL (ref 6–20)
CO2: 23 mmol/L (ref 22–32)
CREATININE: 0.89 mg/dL (ref 0.61–1.24)
Calcium: 8.8 mg/dL — ABNORMAL LOW (ref 8.9–10.3)
Chloride: 103 mmol/L (ref 98–111)
GFR calc non Af Amer: >60 mL/min (ref >60)
Glucose, Bld: 143 mg/dL — ABNORMAL HIGH (ref 70–99)
Potassium: 3.8 mmol/L (ref 3.5–5.1)
Sodium: 136 mmol/L (ref 135–145)

## 2019-03-02 SURGERY — AMPUTATION, FOOT, RAY
Anesthesia: Regional | Site: Foot | Laterality: Right

## 2019-03-02 MED ORDER — ONDANSETRON HCL 4 MG PO TABS: 4.0000 mg | ORAL_TABLET | Freq: Four times a day (QID) | ORAL | Status: DC | PRN

## 2019-03-02 MED ORDER — OXYCODONE HCL 5 MG PO TABS: 10.0000 mg | ORAL_TABLET | ORAL | Status: DC | PRN

## 2019-03-02 MED ORDER — ONDANSETRON HCL 4 MG/2ML IJ SOLN: 4.0000 mg | Freq: Four times a day (QID) | INTRAMUSCULAR | Status: DC | PRN

## 2019-03-02 MED ORDER — 0.9 % SODIUM CHLORIDE (POUR BTL) OPTIME
TOPICAL | Status: DC | PRN
  Administered 2019-03-02: 1000 mL

## 2019-03-02 MED ORDER — OXYCODONE HCL 5 MG PO TABS
5.0000 mg | ORAL_TABLET | ORAL | Status: DC | PRN
  Administered 2019-03-03: 5 mg via ORAL
  Administered 2019-03-04 – 2019-03-06 (×3): 10 mg via ORAL
  Filled 2019-03-02 (×5): qty 2

## 2019-03-02 MED ORDER — PROMETHAZINE HCL 25 MG/ML IJ SOLN: 6.2500 mg | INTRAMUSCULAR | Status: DC | PRN

## 2019-03-02 MED ORDER — METHOCARBAMOL 1000 MG/10ML IJ SOLN
500.0000 mg | Freq: Four times a day (QID) | INTRAVENOUS | Status: DC | PRN
  Filled 2019-03-02: qty 5

## 2019-03-02 MED ORDER — LACTATED RINGERS IV SOLN
INTRAVENOUS | Status: DC | PRN
  Administered 2019-03-02: 07:00:00 via INTRAVENOUS

## 2019-03-02 MED ORDER — MAGNESIUM CITRATE PO SOLN: 1.0000 | Freq: Once | ORAL | Status: DC | PRN

## 2019-03-02 MED ORDER — FENTANYL CITRATE (PF) 250 MCG/5ML IJ SOLN
INTRAMUSCULAR | Status: AC
  Filled 2019-03-02: qty 5

## 2019-03-02 MED ORDER — PROPOFOL 10 MG/ML IV BOLUS
INTRAVENOUS | Status: AC
  Filled 2019-03-02: qty 60

## 2019-03-02 MED ORDER — SODIUM CHLORIDE 0.9 % IV SOLN
INTRAVENOUS | Status: DC
  Administered 2019-03-02: 10:00:00 via INTRAVENOUS

## 2019-03-02 MED ORDER — DOCUSATE SODIUM 100 MG PO CAPS
100.0000 mg | ORAL_CAPSULE | Freq: Two times a day (BID) | ORAL | Status: DC
  Administered 2019-03-02 – 2019-03-08 (×13): 100 mg via ORAL
  Filled 2019-03-02 (×13): qty 1

## 2019-03-02 MED ORDER — METOCLOPRAMIDE HCL 5 MG/ML IJ SOLN: 5.0000 mg | Freq: Three times a day (TID) | INTRAMUSCULAR | Status: DC | PRN

## 2019-03-02 MED ORDER — MIDAZOLAM HCL 2 MG/2ML IJ SOLN
INTRAMUSCULAR | Status: DC | PRN
  Administered 2019-03-02: 2 mg via INTRAVENOUS

## 2019-03-02 MED ORDER — POLYETHYLENE GLYCOL 3350 17 G PO PACK: 17.0000 g | PACK | Freq: Every day | ORAL | Status: DC | PRN

## 2019-03-02 MED ORDER — CEFAZOLIN SODIUM-DEXTROSE 1-4 GM/50ML-% IV SOLN: 1.0000 g | Freq: Four times a day (QID) | INTRAVENOUS | Status: DC

## 2019-03-02 MED ORDER — PENTOXIFYLLINE ER 400 MG PO TBCR
400.0000 mg | EXTENDED_RELEASE_TABLET | Freq: Three times a day (TID) | ORAL | Status: DC
  Administered 2019-03-02 – 2019-03-08 (×15): 400 mg via ORAL
  Filled 2019-03-02 (×25): qty 1

## 2019-03-02 MED ORDER — ACETAMINOPHEN 325 MG PO TABS: 325.0000 mg | ORAL_TABLET | Freq: Four times a day (QID) | ORAL | Status: DC | PRN

## 2019-03-02 MED ORDER — SODIUM CHLORIDE 0.45 % IV SOLN
INTRAVENOUS | Status: DC
  Administered 2019-03-02: 16:00:00 via INTRAVENOUS

## 2019-03-02 MED ORDER — ONDANSETRON HCL 4 MG/2ML IJ SOLN
INTRAMUSCULAR | Status: DC | PRN
  Administered 2019-03-02: 4 mg via INTRAVENOUS

## 2019-03-02 MED ORDER — METOCLOPRAMIDE HCL 5 MG PO TABS: 5.0000 mg | ORAL_TABLET | Freq: Three times a day (TID) | ORAL | Status: DC | PRN

## 2019-03-02 MED ORDER — ONDANSETRON HCL 4 MG/2ML IJ SOLN
INTRAMUSCULAR | Status: AC
  Filled 2019-03-02: qty 2

## 2019-03-02 MED ORDER — HYDROMORPHONE HCL 1 MG/ML IJ SOLN: 0.5000 mg | INTRAMUSCULAR | Status: DC | PRN

## 2019-03-02 MED ORDER — METHOCARBAMOL 500 MG PO TABS
500.0000 mg | ORAL_TABLET | Freq: Four times a day (QID) | ORAL | Status: DC | PRN
  Administered 2019-03-05 – 2019-03-06 (×2): 500 mg via ORAL
  Filled 2019-03-02 (×2): qty 1

## 2019-03-02 MED ORDER — MEPERIDINE HCL 50 MG/ML IJ SOLN: 6.2500 mg | INTRAMUSCULAR | Status: DC | PRN

## 2019-03-02 MED ORDER — HYDROMORPHONE HCL 1 MG/ML IJ SOLN: 0.2500 mg | INTRAMUSCULAR | Status: DC | PRN

## 2019-03-02 MED ORDER — PROPOFOL 500 MG/50ML IV EMUL
INTRAVENOUS | Status: DC | PRN
  Administered 2019-03-02: 50 ug/kg/min via INTRAVENOUS

## 2019-03-02 MED ORDER — MIDAZOLAM HCL 2 MG/2ML IJ SOLN
INTRAMUSCULAR | Status: AC
  Filled 2019-03-02: qty 2

## 2019-03-02 MED ORDER — BISACODYL 10 MG RE SUPP: 10.0000 mg | Freq: Every day | RECTAL | Status: DC | PRN

## 2019-03-02 SURGICAL SUPPLY — 31 items
BLADE SAW SGTL MED 73X18.5 STR (BLADE) IMPLANT
BLADE SURG 21 STRL SS (BLADE) ×2 IMPLANT
BNDG COHESIVE 4X5 TAN STRL (GAUZE/BANDAGES/DRESSINGS) ×2 IMPLANT
BNDG GAUZE ELAST 4 BULKY (GAUZE/BANDAGES/DRESSINGS) ×2 IMPLANT
CANISTER PREVENA PLUS 150 (CANNISTER) ×1 IMPLANT
CANISTER WOUND CARE 500ML ATS (WOUND CARE) ×1 IMPLANT
COVER SURGICAL LIGHT HANDLE (MISCELLANEOUS) ×4 IMPLANT
COVER WAND RF STERILE (DRAPES) ×2 IMPLANT
DRAPE U-SHAPE 47X51 STRL (DRAPES) ×4 IMPLANT
DRSG ADAPTIC 3X8 NADH LF (GAUZE/BANDAGES/DRESSINGS) ×2 IMPLANT
DRSG PAD ABDOMINAL 8X10 ST (GAUZE/BANDAGES/DRESSINGS) ×4 IMPLANT
DURAPREP 26ML APPLICATOR (WOUND CARE) ×2 IMPLANT
ELECT REM PT RETURN 9FT ADLT (ELECTROSURGICAL) ×2
ELECTRODE REM PT RTRN 9FT ADLT (ELECTROSURGICAL) ×1 IMPLANT
GAUZE SPONGE 4X4 12PLY STRL (GAUZE/BANDAGES/DRESSINGS) ×2 IMPLANT
GLOVE BIOGEL PI IND STRL 9 (GLOVE) ×1 IMPLANT
GLOVE BIOGEL PI INDICATOR 9 (GLOVE) ×1
GLOVE SURG ORTHO 9.0 STRL STRW (GLOVE) ×2 IMPLANT
GOWN STRL REUS W/ TWL XL LVL3 (GOWN DISPOSABLE) ×2 IMPLANT
GOWN STRL REUS W/TWL XL LVL3 (GOWN DISPOSABLE) ×2
KIT BASIN OR (CUSTOM PROCEDURE TRAY) ×2 IMPLANT
KIT PREVENA INCISION MGT 13 (CANNISTER) ×1 IMPLANT
KIT TURNOVER KIT B (KITS) ×2 IMPLANT
NS IRRIG 1000ML POUR BTL (IV SOLUTION) ×2 IMPLANT
PACK ORTHO EXTREMITY (CUSTOM PROCEDURE TRAY) ×2 IMPLANT
PAD ARMBOARD 7.5X6 YLW CONV (MISCELLANEOUS) ×4 IMPLANT
STOCKINETTE IMPERVIOUS LG (DRAPES) IMPLANT
SUT ETHILON 2 0 PSLX (SUTURE) ×2 IMPLANT
TOWEL OR 17X26 10 PK STRL BLUE (TOWEL DISPOSABLE) ×2 IMPLANT
TUBE CONNECTING 12X1/4 (SUCTIONS) ×2 IMPLANT
YANKAUER SUCT BULB TIP NO VENT (SUCTIONS) ×2 IMPLANT

## 2019-03-02 NOTE — Transfer of Care (Addendum)
Immediate Anesthesia Transfer of Care Note  Patient: Noah Thomas  Procedure(s) Performed: Right Foot 1st Ray Amputation (Right Foot)  Patient Location: PACU  Anesthesia Type: Regional  Level of Consciousness: awake, alert , oriented and patient cooperative  Airway & Oxygen Therapy: Patient Spontanous Breathing and Patient connected to face mask oxygen  Post-op Assessment: Report given to RN and Post -op Vital signs reviewed and stable  Post vital signs: Reviewed and stable  Last Vitals:  Vitals Value Taken Time  BP    Temp    Pulse    Resp    SpO2      Last Pain:  Vitals:   03/03/2019 0500  TempSrc: Oral  PainSc:          Complications: No apparent anesthesia complications

## 2019-03-02 NOTE — Progress Notes (Signed)
STROKE TEAM PROGRESS NOTE   INTERVAL HISTORY Pt lying in bed with sitter at bedside due to agitation at night. 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 Currently mildly sleepy due to procedure but still following commands bilaterally.   Vitals:   02/09/2019 0850 03/04/2019 0905 02/08/2019 0911 02/19/2019 0933  BP: 124/81 116/76 136/75 139/80  Pulse: 87 73 77 71  Resp: Temp:   (!) 97.5 F (36.4 C) 98.3 F (36.8 C)  TempSrc:    Oral  SpO2: 96% 94% 95% 96%  Weight:      Height:        CBC:  Recent Labs  Lab 02/24/2019 1450  03/01/19 0348 02/22/2019 0337  WBC 14.6*   < > 12.4* 12.9*  NEUTROABS 10.9*  --  7.7  --   HGB 18.4*   < > 15.5 16.9  HCT 55.4*   < > 46.9 48.2  MCV 88.4   < > 89.2 86.5  PLT 314   < > 259 219   < > = values in this interval not displayed.    Basic Metabolic Panel:  Recent Labs  Lab 03/01/19 0348 02/24/2019 0337  NA 140 136  K 3.5 3.8  CL 103 103  CO2 25 23  GLUCOSE 123* 143*  BUN 16 13  CREATININE 0.99 0.89  CALCIUM 8.6* 8.8*   Lipid Panel:     Component Value Date/Time   CHOL 136 03/01/2019 0348   TRIG 176 (H) 03/01/2019 0348   HDL 20 (L) 03/01/2019 0348   CHOLHDL 6.8 03/01/2019 0348   VLDL 35 03/01/2019 0348   LDLCALC 81 03/01/2019 0348   HgbA1c:  Lab Results  Component Value Date   HGBA1C 7.7 (H) 02/16/2019   Urine Drug Screen: No results found for: LABOPIA, COCAINSCRNUR, LABBENZ, AMPHETMU, THCU, LABBARB  Alcohol Level     Component Value Date/Time   ETH <10 02/19/2019 1450    IMAGING Ct Angio Head W Or Wo Contrast  Result Date: 02/28/2019 CLINICAL DATA:  Stroke follow-up. 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 EXAM: CT ANGIOGRAPHY HEAD AND NECK TECHNIQUE: Multidetector CT imaging of the head and neck was performed using the standard protocol during bolus administration of intravenous contrast. Multiplanar CT image reconstructions and MIPs were obtained to evaluate the  vascular anatomy. Carotid stenosis measurements (when applicable) are obtained utilizing NASCET criteria, using the distal internal carotid diameter as the denominator. CONTRAST:  75mL ISOVUE-370 IOPAMIDOL (ISOVUE-370) INJECTION 76% COMPARISON:  MRI of the brain 03/01/2019 FINDINGS: CT HEAD FINDINGS Brain: The left internal capsule infarct is again noted, now slightly lower density than on the previous CT. No new infarct is present. The remote right MCA territory encephalomalacia is stable. The ventricles are of proportionate to the degree of atrophy. No significant extraaxial fluid collection is present. The brainstem and cerebellum are within normal limits. Vascular: Atherosclerotic calcifications are present within the cavernous internal carotid arteries bilaterally. There is no hyperdense vessel. Skull: The craniocervical junction is normal. Upper cervical spine is within normal limits. Marrow signal is unremarkable. Sinuses: Scattered opacification of left ethmoid air cells are present. There are no fluid levels. The paranasal sinuses and mastoid air cells are otherwise clear. Orbits: The globes and orbits are within normal limits. Review of the MIP images confirms the above findings CTA NECK FINDINGS Aortic arch: A 3 vessel arch configuration is present. Minimal atherosclerotic changes are present at the great vessel origins. There is  no significant stenosis or aneurysm. Right carotid system: The right common carotid artery is within normal limits. Dense calcifications are present at the right carotid bifurcation. There is a high-grade stenosis at the carotid bifurcation. Lumen is narrowed to less than 1 mm. The more distal right common carotid artery is within normal limits to the skull base. Left carotid system: The left common carotid artery demonstrates some atherosclerotic irregularity. There is a high-grade, near occlusive stenosis of the left internal carotid artery at its bifurcation. The cervical left  ICA is otherwise normal. Vertebral arteries: Extensive atherosclerotic calcifications are present along the vertebral arteries bilaterally. The right vertebral artery is dominant. There is a high-grade stenosis at the proximal left vertebral artery. No significant stenosis is present in the right vertebral artery. The left vertebral artery is reconstituted at the distal V1 segment. Extensive atherosclerotic changes are present throughout the V2 segment. These are high-grade stenosis at the level of C1. Skeleton: Vertebral body heights alignment are maintained. No focal lytic or blastic lesions are present. Other neck: The soft tissues the neck are otherwise unremarkable. No focal mucosal or submucosal lesions are present. Salivary glands are within normal limits. No significant adenopathy is present. Thyroid is normal. Upper chest: Next mild dependent atelectasis is present. The lung apices are otherwise clear. Thoracic inlet is within normal limits. Review of the MIP images confirms the above findings CTA HEAD FINDINGS Anterior circulation: Atherosclerotic calcifications are present within the cavernous internal carotid arteries bilaterally without a significant stenosis through the ICA termini. The left A1 is hypoplastic. The right A1 is normal. The anterior communicating artery is patent. ACA branch vessels are within normal limits bilaterally. There is a high-grade stenosis of the anterior right M2 segment with marked attenuation of distal branches. Diffuse irregularity present and more posterior left MCA branches. There is moderate irregularity in left MCA branches. Pial collaterals are evident. Posterior circulation: The right vertebral artery is the dominant vessel. Segmental irregularity is present in the left V4 segment without a significant stenosis. PICA origins are visualized and normal. The vertebrobasilar junction is normal. The basilar artery is normal. Both posterior cerebral arteries originate from  the basilar tip. There is some irregularity of the proximal PCA vessels without significant proximal stenosis. Branch vessels are intact. Venous sinuses: The dural sinuses are patent. Anatomic variants: None Delayed phase: No pathologic enhancement is present. Infarcts are well-defined. Review of the MIP images confirms the above findings IMPRESSION: 1. High-grade bilateral proximal ICA stenoses at the carotid bifurcations. 2. 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 3. Hypoplastic left A1 segment. 4. High-grade stenosis of the anterior right M2 segment. 5. Moderate diffuse medium and distal small vessel disease in both the anterior and posterior circulations. 6. Expected evolution of left internal capsule nonhemorrhagic infarct. 7. 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 Electronically Signed   By: Marin Roberts M.D.   On: 02/28/2019 21:01   Dg Chest 2 View  Result Date: 02/09/2019 CLINICAL DATA:  Altered mental status today. EXAM: CHEST - 2 VIEW COMPARISON:  Single-view of the chest 07/16/2006. FINDINGS: Lungs clear. Heart size normal. No pneumothorax or pleural fluid. No acute or focal bony abnormality. IMPRESSION: Negative chest. Electronically Signed   By: Drusilla Kanner M.D.   On: 02/07/2019 16:00   Dg Knee 2 Views Right  Result Date: 02/21/2019 CLINICAL DATA:  Knee pain, initial encounter EXAM: RIGHT KNEE - 2 VIEW COMPARISON:  None. FINDINGS: Mild  medial joint space narrowing is noted. No acute fracture or dislocation is seen. Mild patellofemoral spurring is noted as well. IMPRESSION: Mild degenerative change without acute abnormality. Electronically Signed   By: Alcide Clever M.D.   On: 03/09/2019 16:03   Ct Head Wo Contrast  Result Date: 02/26/2019 CLINICAL DATA:  Altered level of consciousness EXAM: CT HEAD WITHOUT CONTRAST TECHNIQUE: Contiguous axial images were obtained from the base of the skull through  the vertex without intravenous contrast. COMPARISON:  None. FINDINGS: Brain: Mild atrophic changes are noted. Encephalomalacia changes are seen in the distribution of the right middle cerebral artery consistent with prior infarct. Rounded decreased area of attenuation is noted in the region of the internal capsule on the left suggestive of subacute to chronic ischemia. No focal area of acute infarct or acute hemorrhage is seen. No space-occupying mass lesion is noted. Vascular: No hyperdense vessel or unexpected calcification. Skull: Normal. Negative for fracture or focal lesion. Sinuses/Orbits: No acute finding. Other: None. IMPRESSION: Chronic atrophic changes. 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 Rounded somewhat elongated area of decreased attenuation on the left in the region of the internal capsule and basal ganglia consistent with subacute to chronic ischemia. No acute infarct is noted. Electronically Signed   By: Alcide Clever M.D.   On: 03/09/2019 16:06   Ct Angio Neck W Or Wo Contrast  Result Date: 02/28/2019 CLINICAL DATA:  Stroke follow-up. 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 EXAM: CT ANGIOGRAPHY HEAD AND NECK TECHNIQUE: Multidetector CT imaging of the head and neck was performed using the standard protocol during bolus administration of intravenous contrast. Multiplanar CT image reconstructions and MIPs were obtained to evaluate the vascular anatomy. Carotid stenosis measurements (when applicable) are obtained utilizing NASCET criteria, using the distal internal carotid diameter as the denominator. CONTRAST:  75mL ISOVUE-370 IOPAMIDOL (ISOVUE-370) INJECTION 76% COMPARISON:  MRI of the brain 02/23/2019 FINDINGS: CT HEAD FINDINGS Brain: The left internal capsule infarct is again noted, now slightly lower density than on the previous CT. No new infarct is present. The remote right MCA territory encephalomalacia is stable. The  ventricles are of proportionate to the degree of atrophy. No significant extraaxial fluid collection is present. The brainstem and cerebellum are within normal limits. Vascular: Atherosclerotic calcifications are present within the cavernous internal carotid arteries bilaterally. There is no hyperdense vessel. Skull: The craniocervical junction is normal. Upper cervical spine is within normal limits. Marrow signal is unremarkable. Sinuses: Scattered opacification of left ethmoid air cells are present. There are no fluid levels. The paranasal sinuses and mastoid air cells are otherwise clear. Orbits: The globes and orbits are within normal limits. Review of the MIP images confirms the above findings CTA NECK FINDINGS Aortic arch: A 3 vessel arch configuration is present. Minimal atherosclerotic changes are present at the great vessel origins. There is no significant stenosis or aneurysm. Right carotid system: The right common carotid artery is within normal limits. Dense calcifications are present at the right carotid bifurcation. There is a high-grade stenosis at the carotid bifurcation. Lumen is narrowed to less than 1 mm. The more distal right common carotid artery is within normal limits to the skull base. Left carotid system: The left common carotid artery demonstrates some atherosclerotic irregularity. There is a high-grade, near occlusive stenosis of the left internal carotid artery at its bifurcation. The cervical left ICA is otherwise normal. Vertebral arteries: Extensive atherosclerotic calcifications are present along the vertebral arteries bilaterally. The right vertebral  artery is dominant. There is a high-grade stenosis at the proximal left vertebral artery. No significant stenosis is present in the right vertebral artery. The left vertebral artery is reconstituted at the distal V1 segment. Extensive atherosclerotic changes are present throughout the V2 segment. These are high-grade stenosis at the  level of C1. Skeleton: Vertebral body heights alignment are maintained. No focal lytic or blastic lesions are present. Other neck: The soft tissues the neck are otherwise unremarkable. No focal mucosal or submucosal lesions are present. Salivary glands are within normal limits. No significant adenopathy is present. Thyroid is normal. Upper chest: Next mild dependent atelectasis is present. The lung apices are otherwise clear. Thoracic inlet is within normal limits. Review of the MIP images confirms the above findings CTA HEAD FINDINGS Anterior circulation: Atherosclerotic calcifications are present within the cavernous internal carotid arteries bilaterally without a significant stenosis through the ICA termini. The left A1 is hypoplastic. The right A1 is normal. The anterior communicating artery is patent. ACA branch vessels are within normal limits bilaterally. There is a high-grade stenosis of the anterior right M2 segment with marked attenuation of distal branches. Diffuse irregularity present and more posterior left MCA branches. There is moderate irregularity in left MCA branches. Pial collaterals are evident. Posterior circulation: The right vertebral artery is the dominant vessel. Segmental irregularity is present in the left V4 segment without a significant stenosis. PICA origins are visualized and normal. The vertebrobasilar junction is normal. The basilar artery is normal. Both posterior cerebral arteries originate from the basilar tip. There is some irregularity of the proximal PCA vessels without significant proximal stenosis. Branch vessels are intact. Venous sinuses: The dural sinuses are patent. Anatomic variants: None Delayed phase: No pathologic enhancement is present. Infarcts are well-defined. Review of the MIP images confirms the above findings IMPRESSION: 1. High-grade bilateral proximal ICA stenoses at the carotid bifurcations. 2. 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 3. Hypoplastic left A1 segment. 4. High-grade stenosis of the anterior right M2 segment. 5. Moderate diffuse medium and distal small vessel disease in both the anterior and posterior circulations. 6. Expected evolution of left internal capsule nonhemorrhagic infarct. 7. 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 Electronically Signed   By: Marin Roberts M.D.   On: 02/28/2019 21:01   [name]  Result Date: 02/16/2019 CLINICAL DATA:  Focal neuro deficit for greater than 6 hours. Altered level of consciousness. EXAM: MRI HEAD WITHOUT CONTRAST TECHNIQUE: Multiplanar, multiecho pulse sequences of the brain and surrounding structures were obtained without intravenous contrast. COMPARISON:  CT head without contrast 01/29/2019 FINDINGS: Brain: The diffusion-weighted images confirm an acute nonhemorrhagic infarct involving the genu of the left internal capsule and globus pallidus. T2 signal changes are associated with the acute infarct, consistent with the subacute time frame. Remote encephalomalacia is again noted right MCA territory. The ventricles are of proportionate to the degree of atrophy. Wallerian degeneration is present in the right cerebral peduncle extending into the pons. Cerebellum is normal. No significant extraaxial fluid collection is present. Vascular: Flow is present in the major intracranial arteries. Skull and upper cervical spine: The craniocervical junction is normal. Upper cervical spine is within normal limits. Marrow signal is unremarkable. Sinuses/Orbits: The paranasal sinuses and mastoid air cells are clear. Bilateral lens replacements are noted. The globes and orbits are within normal limits. IMPRESSION: 1. 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 This is consistent with  a time frame of greater than 6 hours. 2. Remote encephalomalacia of the right MCA territory with  associated wallerian degeneration. Electronically Signed   By: Marin Roberts M.D.   On: 03/07/2019 19:18   Dg Foot Complete Right  Result Date: 02/16/2019 CLINICAL DATA:  Right foot pain EXAM: RIGHT FOOT COMPLETE - 3+ VIEW COMPARISON:  None. FINDINGS: Comminuted fracture is noted at the base of the first distal phalanx. This extends into the articular surface in multiple locations. No other fracture is seen. Soft tissue swelling is noted. IMPRESSION: Comminuted fracture of the first distal phalanx which extends to the articular surface. Electronically Signed   By: Alcide Clever M.D.   On: 02/11/2019 16:01   Vas [place] Vanice Sarah With/wo Tbi  Result Date: 03/01/2019 LOWER EXTREMITY DOPPLER STUDY Indications: Gangrene. High Risk Factors: Current smoker.  Comparison Study: No prior study on file Performing Technologist: Sherren Kerns RVS  Examination Guidelines: A complete evaluation includes at minimum, Doppler waveform signals and systolic blood pressure reading at the level of bilateral brachial, anterior tibial, and posterior tibial arteries, when vessel segments are accessible. Bilateral testing is considered an integral part of a complete examination. Photoelectric Plethysmograph (PPG) waveforms and toe systolic pressure readings are included as required and additional duplex testing as needed. Limited examinations for reoccurring indications may be performed as noted.  ABI Findings: +---------+------------------+-----+-------------------+-----------------------+ Right    Rt Pressure (mmHg)IndexWaveform           Comment                 +---------+------------------+-----+-------------------+-----------------------+ Brachial 183                    triphasic                                  +---------+------------------+-----+-------------------+-----------------------+ PTA      67                0.37 dampened monophasic                         +---------+------------------+-----+-------------------+-----------------------+ DP       74                0.40 dampened monophasic                        +---------+------------------+-----+-------------------+-----------------------+ Great Toe                                          Not done secondary to                                                      gangrene                +---------+------------------+-----+-------------------+-----------------------+ +--------+------------------+-----+---------+-------+ Left    Lt Pressure (mmHg)IndexWaveform Comment +--------+------------------+-----+---------+-------+ [id number]                    triphasic        +--------+------------------+-----+---------+-------+ PTA     185  1.01 biphasic         +--------+------------------+-----+---------+-------+ DP      176               0.96 biphasic         +--------+------------------+-----+---------+-------+ +-------+-----------+-----------+------------+------------+ ABI/TBIToday's ABIToday's TBIPrevious ABIPrevious TBI +-------+-----------+-----------+------------+------------+ Right  0.40                                           +-------+-----------+-----------+------------+------------+ Left   1.01                                           +-------+-----------+-----------+------------+------------+  Summary: Right: 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 Left: Resting left ankle-brachial index is within normal range. No evidence of significant left lower extremity arterial disease.  *See table(s) above for measurements and observations.  Electronically signed by Coral Else MD on 03/01/2019 at 10:49:49 AM.   Timmothy Euler US Carotid  Result Date: 03/01/2019 Carotid Arterial Duplex Study Indications:                           ICA stenosis. Limitations:                           Technically difficult  patient due                                        movement, cooperation, and positioning. Pre-Surgical Evaluation & Surgical     Stenosis at bifurcation only. ICA is not Correlation:                           normal past stenosis. Bifurcation is                                        located near the [place]oid [place]. Right sided                                        80 to 99% ICA stenosis originates within                                        the bifurcation and extends approximately                                        1.1 cm into the proximal ICA. There is no                                        apparent hemodynamically significant  stenosis involving the mid or distal ICA.                                        Left sided 80 to 99% ICA stenosis                                        originates within the bifurcation and                                        extends approximately 1.1 cm into the                                        proximal ICA. There is no apparent                                        hemodynamically significant stenosis                                        involving the mid or distal ICA. Performing Technologist: Chanda Busing RVT  Examination Guidelines: A complete evaluation includes B-mode imaging, spectral Doppler, color Doppler, and power Doppler as needed of all accessible portions of each vessel. Bilateral testing is considered an integral part of a complete examination. Limited examinations for reoccurring indications may be performed as noted.  Right Carotid Findings: +----------+--------+--------+--------+--------+--------+           PSV cm/sEDV cm/sStenosisDescribeComments +----------+--------+--------+--------+--------+--------+ CCA Distal63      16                               +----------+--------+--------+--------+--------+--------+ ICA Prox  397     115     80-99%                    +----------+--------+--------+--------+--------+--------+ ICA Distal85      31                               +----------+--------+--------+--------+--------+--------+  Left Carotid Findings: +----------+--------+--------+--------+--------+--------+           PSV cm/sEDV cm/sStenosisDescribeComments +----------+--------+--------+--------+--------+--------+ CCA Distal36      9                                +----------+--------+--------+--------+--------+--------+ ICA Prox  358     123     80-99%                   +----------+--------+--------+--------+--------+--------+ ICA Distal106     29                               +----------+--------+--------+--------+--------+--------+  Summary: Right Carotid: Right sided 80 to 99% ICA stenosis originates within the  bifurcation and extends approximately 1.1 cm into the proximal                ICA. There is no apparent hemodynamically significant stenosis                involving the mid or distal ICA. Left Carotid: Left sided 80 to 99% ICA stenosis originates within the               bifurcation and extends approximately 1.1 cm into the proximal               ICA. There is no apparent hemodynamically significant stenosis               involving the mid or distal ICA.  *See table(s) above for measurements and observations.     Preliminary    US Abdomen Limited Ruq  Result Date: 03/01/2019 CLINICAL DATA:  Elevated LFTs. EXAM: ULTRASOUND ABDOMEN LIMITED RIGHT UPPER QUADRANT COMPARISON:  None. FINDINGS: Gallbladder: Physiologically distended. No gallstones or wall thickening visualized. No sonographic Murphy sign noted by sonographer. Common bile duct: Diameter: 6 mm, normal. Liver: No focal lesion identified. Diffusely increased and heterogeneous in parenchymal echogenicity. Portal vein is patent on color Doppler imaging with normal direction of blood flow towards the liver. IMPRESSION: 1. Hepatic steatosis. 2. Normal  sonographic appearance of gallbladder and biliary tree. Electronically Signed   By: Narda Rutherford M.D.   On: 03/01/2019 23:36     PHYSICAL EXAM Temp:  [97.5 F (36.4 C)-98.6 F (37 C)] 98.3 F (36.8 C) (03/24 0933) Pulse Rate:  [69-96] 71 (03/24 0933) Resp:  [14-20] 20 (03/24 0933) BP: (116-177)/(70-106) 139/80 (03/24 0933) SpO2:  [94 %-98 %] 96 % (03/24 0933)  General - Well nourished, well developed, in no apparent distress.  Ophthalmologic - fundi not visualized due to noncooperation.  Cardiovascular - Regular rate and rhythm.  Mental Status -  Level of arousal and orientation to self, place, and person were intact, however not orientated to age or time. Language including expression, repetition, comprehension was assessed and found intact, mild dysarthria, paucity of speech, naming 2 out of 3  Cranial Nerves II - XII - II - Visual field intact OU. III, IV, VI - Extraocular movements intact. V - Facial sensation intact bilaterally. VII - Facial movement intact bilaterally. VIII - Hearing & vestibular intact bilaterally. X - Palate elevates symmetrically. XI - Chin turning & shoulder shrug intact bilaterally. XII - Tongue protrusion intact.  Motor Strength - The patient's strength was normal in all extremities and pronator drift was absent except mildly limited ROM of left shoulder.  Bulk was normal and fasciculations were absent.   Motor Tone - Muscle tone was assessed at the neck and appendages and was normal.  Reflexes - The patient's reflexes were symmetrical in all extremities and he had no pathological reflexes.  Sensory - Light touch, temperature/pinprick were assessed and were symmetrical.    Coordination - The patient had normal movements in the hands with no ataxia or dysmetria.  Tremor was absent.  Gait and Station - deferred.   ASSESSMENT/PLAN Noah Thomas is a 58 y.o. male with history of tobacco use and mental health issues presenting with  altered mental status. Found to have R toe cellulitis. 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 Prior R  MCA infarct w/ encephalomalacia. Subacute to chronic L internal capsule/BG infarct.  MRI  Acute/subacute infarct L internal capsule/BG. 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 Mod diffuse medium and distal small stenosis anterior and posterior circulation. Expected evolution L IC infarct. chronic R MCA encephalomalacia   2D Echo pending   Carotid doppler R ICA bifurcation 80-99%, L ICA bifurcation 80-99%  LDL 81  HgbA1c 7.7  Lovenox 40 mg sq daily for VTE prophylaxis  No antithrombotic prior to admission, now on aspirin 81 mg daily. Given mild stroke, recommend aspirin 81 mg and plavix 75 mg daily x 3 weeks, then aspirin alone. Plavix currently on hold for recent amputation, consider to start once hemodynamically stable.   Therapy recommendations:  SNF  Disposition:  pending (currently lives in a mobile home that is being condemned. Does not have insurance for SNF. Plans to go home with brother, who was educated on APS resources)  Carotid stenosis  CTA head and neck showed bilateral high-grade stenosis at ICA bifurcation  Left ICA high-grade stenosis likely the etiology of current stroke  Right ICA high-grade stenosis likely to explain his right MCA encephalomalacia and right M2 stenosis  Carotid doppler confirms R ICA bifurcation 80-99%, L ICA bifurcation 80-99%  VVS on board to consider possible left CEA  Hypertension  Stable, 160-180s . Permissive hypertension (OK if < 220/120) but gradually normalize in 5-7 days . Long-term BP goal 130-150 given b/l ICA high grade stenosis  Hyperlipidemia  Home meds:  No statin  LDL 81, goal < 70  Hold statin  for now given elevated LFTs  Consider statin once LFTs improve  Diabetes type II  HgbA1c 7.7, goal < 7.0  Uncontrolled  SSI   CBG   Close PCP follow-up and better DM control.  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 Please call with questions. Pt will follow up with stroke clinic NP at [facility] in about 4 weeks. Thanks for the consult.   Marvel Plan, MD PhD Stroke Neurology 02/07/2019 11:01 AM     To contact Stroke Continuity provider, please refer to [email or web address]. After hours, contact General Neurology

## 2019-03-02 NOTE — Interval H&P Note (Signed)
History and Physical Interval Note:  03/05/2019 6:33 AM  Noah Thomas  has presented today for surgery, with the diagnosis of Osteomyelitis, Gangrene Right Great Toe.  The various methods of treatment have been discussed with the patient and family. After consideration of risks, benefits and other options for treatment, the patient has consented to  Procedure(s): Right Foot 1st Ray Amputation (Right) as a surgical intervention.  The patient's history has been reviewed, patient examined, no change in status, stable for surgery.  I have reviewed the patient's chart and labs.  Questions were answered to the patient's satisfaction.     [name]

## 2019-03-02 NOTE — Progress Notes (Signed)
Orthopedic Tech Progress Note Patient Details:  Noah Thomas 25-Jun-1961 [id number]  Ortho Devices Type of Ortho Device: Postop shoe/boot Ortho Device/Splint Interventions: Ordered, Application, Adjustment   Post Interventions Patient Tolerated: Well Instructions Provided: Adjustment of device, Care of device   Shaughnessy Gethers J Vrinda Heckstall 03/05/2019, 11:55 AM

## 2019-03-02 NOTE — Plan of Care (Signed)
Problem: Education: Goal: Knowledge of General Education information will improve Description Including pain rating scale, medication(s)/side effects and non-pharmacologic comfort measures Outcome: Progressing   Problem: Health Behavior/Discharge Planning: Goal: Ability to manage health-related needs will improve Outcome: Progressing   Problem: Clinical Measurements: Goal: Respiratory complications will improve Outcome: Progressing Goal: Cardiovascular complication will be avoided Outcome: Progressing   Problem: Activity: Goal: Risk for activity intolerance will decrease Outcome: Progressing   Problem: Nutrition: Goal: Adequate nutrition will be maintained Outcome: Progressing   Problem: Coping: Goal: Level of anxiety will decrease Outcome: Progressing   Problem: Pain Managment: Goal: General experience of comfort will improve Outcome: Progressing   Problem: Safety: Goal: Ability to remain free from injury will improve Outcome: Progressing   Problem: Skin Integrity: Goal: Risk for impaired skin integrity will decrease Outcome: Progressing   

## 2019-03-02 NOTE — Progress Notes (Signed)
Spoke with brother, Kali Dang to inform about aortogram tomorrow. Brother will not be able to take this patient in his home at discharge. Brother would like to have SW call him and start process to place him in SNF and to apply for medicaid.

## 2019-03-02 NOTE — Anesthesia Postprocedure Evaluation (Signed)
Anesthesia Post Note  Patient: Noah Thomas  Procedure(s) Performed: Right Foot 1st Ray Amputation (Right Foot)     Patient location during evaluation: PACU Anesthesia Type: Regional Level of consciousness: sedated Pain management: pain level controlled Vital Signs Assessment: post-procedure vital signs reviewed and stable Respiratory status: spontaneous breathing Cardiovascular status: stable Anesthetic complications: no    Last Vitals:  Vitals:   02/10/2019 0911 02/23/2019 0933  BP: 136/75 139/80  Pulse: 77 71  Resp: 15 20  Temp: (!) 36.4 C 36.8 C  SpO2: 95% 96%    Last Pain:  Vitals:   02/14/2019 0933  TempSrc: Oral  PainSc:                  [name]

## 2019-03-02 NOTE — Op Note (Signed)
02/08/2019  8:27 AM  PATIENT:  Noah Thomas    PRE-OPERATIVE DIAGNOSIS:  Osteomyelitis, Gangrene Right Great Toe  POST-OPERATIVE DIAGNOSIS:  Same  PROCEDURE:  Right Foot 1st Ray Amputation Local tissue rearrangement for wound closure 3 x 9 cm. Application of Praveena wound VAC 13 cm.   SURGEON:  [name], MD  PHYSICIAN ASSISTANT:None ANESTHESIA:   popliteal block    Noah Thomas is a  58 y.o. male with a diagnosis of Osteomyelitis, Gangrene Right Great Toe who failed conservative measures and elected for surgical management.    The risks benefits and alternatives were discussed with the patient preoperatively including but not limited to the risks of infection, bleeding, nerve injury, cardiopulmonary complications, the need for revision surgery, among others, and the patient was willing to proceed.  OPERATIVE IMPLANTS: Praveena wound VAC 13 cm  @ENCIMAGES @  OPERATIVE FINDINGS: Patient had calcified vessels ischemic margins were resected.  There was good petechial bleeding on the remaining tissue.  OPERATIVE PROCEDURE: Patient was brought the operating room after undergoing a popliteal block.  After adequate levels anesthesia were obtained patient's right lower extremity was prepped using ChloraPrep and draped into a sterile field a timeout was called.  A racquet incision was made around the necrotic tissue in the great toe.  The great toe sesamoids and first ray and necrotic gangrenous tissue were resected in one block of tissue.  Further tissue margins were resected to obtain healthy margins.  This left a wound that was 3 x 9 cm.  The wound was irrigated with normal saline electrocautery was used for hemostasis.  Local tissue rearrangement was used to close the wound that was 3 x 9 cm.  There was no tension on the skin.  A Praveena wound VAC was applied this had a good suction fit patient was taken the PACU in stable condition.  Is way in   DISCHARGE PLANNING:  Antibiotic  duration: Continue antibiotics for 24 hours  Weightbearing: Nonweightbearing on the right  Pain medication: Opioid pathway ordered  Dressing care/ Wound VAC: Continue wound VAC for 1 week  Ambulatory devices: Walker  Discharge to: Anticipate discharge to home when patient is safe with therapy.  Follow-up: In the office 1 week post operative.

## 2019-03-02 NOTE — Plan of Care (Addendum)
Pt with continued flat affect. Finally receptive to Hibiclens bath. Pt continues to remove drsgs and arm ID bands. Bed exit alarm placed and rounds continued per MD order and unit protocol. Noted Psych not consulted.  Spoke with Oswaldo Done from OR re: patient, updated that pt has no access at this time, no pre-op antibiotics given. Pt is incont.of urine. CBG taken within 1 hour prior to leaving resulted at 129.
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
## 2019-03-02 NOTE — Anesthesia Procedure Notes (Signed)
Anesthesia Regional Block: Popliteal block   Pre-Anesthetic Checklist: ,, timeout performed, Correct Patient, Correct Site, Correct Laterality, Correct Procedure, Correct Position, site marked, Risks and benefits discussed,  Surgical consent,  Pre-op evaluation,  At surgeon's request and post-op pain management  Laterality: Right  Prep: chloraprep       Needles:  Injection technique: Single-shot  Needle Type: Stimiplex     Needle Length: 10cm  Needle Gauge: 21     Additional Needles:   Procedures:,,,, ultrasound used (permanent image in chart),,,,  Motor weakness within 5 minutes.   Nerve Stimulator or Paresthesia:  Response: 0.5 mA,   Additional Responses:   Narrative:  Start time: 03-11-2019 7:15 AM End time: 03/11/19 7:20 AM Injection made incrementally with aspirations every 5 mL.  Performed by: Personally  Anesthesiologist: [name], MD  Additional Notes: Nerve located and needle positioned with direct ultrasound guidance. Good perineural spread. Patient tolerated well.

## 2019-03-02 NOTE — Anesthesia Procedure Notes (Signed)
Procedure Name: MAC Date/Time: 03/04/2019 7:35 AM Performed by: Kathryne Hitch, CRNA Pre-anesthesia Checklist: Patient identified, Emergency Drugs available, Suction available, Patient being monitored and Timeout performed Patient Re-evaluated:Patient Re-evaluated prior to induction Oxygen Delivery Method: Simple face mask Preoxygenation: Pre-oxygenation with 100% oxygen Induction Type: IV induction Dental Injury: Teeth and Oropharynx as per pre-operative assessment

## 2019-03-02 NOTE — Progress Notes (Signed)
Pt returned to room 5N10 after surgery. Received report from [place], RN in PACU. See assessment. Will continue to monitor.

## 2019-03-02 NOTE — Progress Notes (Addendum)
PROGRESS NOTE    Noah Thomas  ZOX:[id number] DOB: August 10, 1961 DOA: 2019/03/13 PCP: Patient, No Pcp Per  Brief Narrative:58 year old male, lives alone in a mobile home and independent, has not seen a physician in a long time, PMH of significant longstanding mental health issues, not on prescription medications, tobacco abuse, presented to [facility] ED on March 13, 2019 via EMS with altered mental status.  History obtained from brother.  Brother indicates that patient was normal until his teenage years when he started becoming paranoid and agitated while living with his parents, eventually had to be placed in [facility].  Over the years patient has calmed down, able to go to the grocery store for food, cigarettes but has no insight and unable to truly care for himself.  He lives in [place] conditions to an extent that when EMS went to retrieve him today, authorities were planning to "condemn" his trailer home.  Patient's brother has given him a debit card and provides with the finances.  Patient last visited his brother on [birth year], his birthday at which point he was in his usual state of health except he complained of some toe pain but was managing to ambulate.  Since then the brother was trying to reach him by phone for several days and went over to his house yesterday but did not get an answer at the door.  He also noticed decrease pending on the debit card that he had provided.  When he returned today he heard some soft noises in the house and called 911 and EMS found the patient on the ground in filthy condition, confused.  Patient is unable to provide any history.  He cannot tell me why he is in the hospital.  At one point he said he was here to get "checked". ED Course: Noted to be in filthy condition, tachycardic, not febrile, not hypotensive, leukocytosis, elevated lactate, right toe exam suggestive of possible cellulitis, initiated sepsis protocol and received IV fluids and broad-spectrum IV antibiotics.   Orthopedics consulted by EDP and will see him in the morning.  WBC 14.6, hemoglobin 18.4, glucose 211, BUN 33, AST 136, ALT 69, total bilirubin 1.5, CK 3000 323, troponin 0 0.03, urine microscopy not suggestive of UTI, chest x-ray negative, CT head suggests subacute to chronic ischemia, right foot x-ray showed comminuted fracture of the first distal phalanx.   Subjective: Pulled another IV out this am post-op.  Sitter ordered.  Had R great toe amp this am w/o incident. Patient is sedated postop , did not get any new history.  Pt's brother called and said that patient has had mental illness since a teenager, their parents cared for him until they both died , since then his brother Noah Thomas has been financially taking care of the patient.   Assessment & Plan:   Principal Problem:   Gangrene of toe of right foot (HCC) Active Problems:   Acute metabolic encephalopathy   Cellulitis   Osteomyelitis of great toe of right foot (HCC)   New onset type 2 diabetes mellitus (HCC)   Acute lacunar stroke (HCC)   Dehydration   Adult failure to thrive   Internal carotid artery stenosis   Rhabdomyolysis   Sepsis (HCC)   Moderate protein-calorie malnutrition (HCC)   Smoking   Cerebral thrombosis with cerebral infarction   Abnormal LFTs   Open nondisplaced fracture of distal phalanx of right great toe  Hospital Problems:   # R great toe gangrene/ abscess: with comminuted fracture of the right first  distal phalanx extending to the articular surface-continue Vanco and cefepime started IV empirically.   - pt had toe amputation by Dr Lajoyce Corners this am 3/24, per ortho notes, cont abx (vanc/ cefepime) for 24 hrs post-op; stop orders written on abx - wound VAC for 1 week, NWB on R foot, see OP note for other rec's  # Acute CVA/ bilat ICA stenosis: involving the left internal capsule and basal ganglia.  PT OT speech consulted.  His hemoglobin A1c is 7.7 obtain lipid panel.  TSH 0.664. MRI of the brain acute to subacute  nonhemorrhagic infarct involving the left internal capsule and basal ganglia with the timeframe of greater than 6 hours.  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  - seen by neurology, appreciate rec's - sig bilat ICA stenosis by CTA, VVS is following and working this up  # Altered mental status: combination of stroke , sepsis, dehydration and possible underlying mental illness. Don't have a good baseline, reportedly he has mental illness since a teenager, was in [place] at one time. As an adult never worked more than a week, lives alone but has been dependent on his family all his adult life for food and lodging.   -- will consult psychiatry for diagnosis, any treatment rec's -- issue of how aggressive to be in setting of underlying long-standing mental illness, patient pulling IV's, etc... will consult palliative care to meet w/ family / patient for GOC -- pt pulling IV's out and pulled out PICC line as well > sitter ordered for now; await psych and pall care recommendations  # DM 2: new diagnosis. Hemoglobin A1c 7.7 SSI for now patient did not take any medications at home.  - seen by Diab team, will start on metformin at time of dc - for now use SSI  # elevated LFTs: improving continue IV fluids and monitor. RUQ US showed fatty liver.  # Gen weakness: resolved, was walking in halls w/ PT yesterday. Appreciate PT OT consult.   # Rhabdomyolysis: mild, resolving w/ IVF's. Pt was found down.    DVT prophylaxis: Lovenox Code Status: Full code, pall care GOC pending Family: discussed w/ brother on the phone Disposition Plan: unclear Consultants: Orthopedics  Rob Tax adviser / [facility] [phone] 02/16/2019, 3:31 PM    Antimicrobials: Vanco and cefepime received a dose of Flagyl in the ER  Surgery 02/26/2019 8:27 AM PATIENT:  Noah Thomas   PRE-OPERATIVE DIAGNOSIS:  Osteomyelitis, Gangrene Right Great Toe POST-OPERATIVE DIAGNOSIS:   Same PROCEDURE:  Right Foot 1st Ray Amputation Local tissue rearrangement for wound closure 3 x 9 cm. Application of Praveena wound VAC 13 cm. SURGEON:  [name], MD PHYSICIAN ASSISTANT:None ANESTHESIA:   popliteal block   Noah Thomas is a  58 y.o. male with a diagnosis of Osteomyelitis, Gangrene Right Great Toe who failed conservative measures and elected for surgical management.   The risks benefits and alternatives were discussed with the patient preoperatively including but not limited to the risks of infection, bleeding, nerve injury, cardiopulmonary complications, the need for revision surgery, among others, and the patient was willing to proceed. OPERATIVE IMPLANTS: Praveena wound VAC 13 cm @ENCIMAGES @ OPERATIVE FINDINGS: Patient had calcified vessels ischemic margins were resected.  There was good petechial bleeding on the remaining tissue.  OPERATIVE PROCEDURE: Patient was brought the operating room after undergoing a popliteal block.  After adequate levels anesthesia were obtained patient's right lower extremity was prepped using ChloraPrep and draped into a sterile  field a timeout was called.  A racquet incision was made around the necrotic tissue in the great toe.  The great toe sesamoids and first ray and necrotic gangrenous tissue were resected in one block of tissue.  Further tissue margins were resected to obtain healthy margins.  This left a wound that was 3 x 9 cm.  The wound was irrigated with normal saline electrocautery was used for hemostasis.  Local tissue rearrangement was used to close the wound that was 3 x 9 cm.  There was no tension on the skin.  A Praveena wound VAC was applied this had a good suction fit patient was taken the PACU in stable condition.  Is way in DISCHARGE PLANNING: Antibiotic duration: Continue antibiotics for 24 hours Weightbearing: Nonweightbearing on the right Pain medication: Opioid pathway ordered Dressing care/ Wound VAC: Continue wound  VAC for 1 week Ambulatory devices: Walker Discharge to: Anticipate discharge to home when patient is safe with therapy. Follow-up: In the office 1 week post operative.  Objective: Vitals:   02/24/2019 0905 03/05/2019 0911 02/13/2019 0933 02/10/2019 1253  BP: 116/76 136/75 139/80 (!) 175/90  Pulse: 73 77 71 88  Resp: 14 15 20 20   Temp:  (!) 97.5 F (36.4 C) 98.3 F (36.8 C) 98.2 F (36.8 C)  TempSrc:   Oral Oral  SpO2: 94% 95% 96% 98%  Weight:      Height:        Intake/Output Summary (Last 24 hours) at 03/09/2019 1531 Last data filed at 02/26/2019 1448 Gross per 24 hour  Intake 1405.24 ml  Output 50 ml  Net 1355.24 ml   Filed Weights   02/07/2019 1454  Weight: 81.6 kg    Examination:  General exam: Appears calm and comfortable  Respiratory system: Clear to auscultation. Respiratory effort normal. Cardiovascular system: S1 & S2 heard, RRR. No JVD, murmurs, rubs, gallops or clicks. No pedal edema. Gastrointestinal system: Abdomen is nondistended, soft and nontender. No organomegaly or masses felt. Normal bowel sounds heard. Central nervous system: He is awake unable to tell me where he is Extremities: Right big toe bruised and black in color and right foot swollen erythematous tender Skin: No rashes, lesions or ulcers    Data Reviewed: I have personally reviewed following labs and imaging studies  CBC: Recent Labs  Lab 02/21/2019 1450 02/28/19 0338 03/01/19 0348 02/24/2019 0337  WBC 14.6* 13.9* 12.4* 12.9*  NEUTROABS 10.9*  --  7.7  --   HGB 18.4* 16.8 15.5 16.9  HCT 55.4* 49.3 46.9 48.2  MCV 88.4 88.5 89.2 86.5  PLT 314 274 259 219   Basic Metabolic Panel: Recent Labs  Lab 02/11/2019 1450 02/28/19 0338 03/01/19 0348 02/24/2019 0337  NA 141 141 140 136  K 4.1 3.8 3.5 3.8  CL 101 105 103 103  CO2 23 23 25 23   GLUCOSE 211* 213* 123* 143*  BUN 33* 25* 16 13  CREATININE 1.11 1.15 0.99 0.89  CALCIUM 9.7 8.9 8.6* 8.8*   GFR: Estimated Creatinine Clearance: 99.3  mL/min (by C-G formula based on SCr of 0.89 mg/dL). Liver Function Tests: Recent Labs  Lab 02/12/2019 1450 02/28/19 0338 03/01/19 0348  AST 136* 112* 97*  ALT 69* 67* 65*  ALKPHOS 98 79 75  BILITOT 1.5* 0.9 0.6  PROT 7.5 6.2* 6.1*  ALBUMIN 3.7 3.1* 2.9*   Recent Labs  Lab 03/09/2019 1450  LIPASE 36   Recent Labs  Lab 03/09/2019 1519  AMMONIA 25   Coagulation Profile: No  results for input(s): INR, PROTIME in the last 168 hours. Cardiac Enzymes: Recent Labs  Lab 03/01/2019 1450 02/20/2019 2235 02/28/19 0338 02/28/19 1004 03/01/19 0348  CKTOTAL 3,323*  --  2,223*  --  1,051*  TROPONINI 0.03* <0.03 0.06* 0.03*  --    BNP (last 3 results) No results for input(s): PROBNP in the last 8760 hours. HbA1C: No results for input(s): HGBA1C in the last 72 hours. CBG: Recent Labs  Lab 03/01/19 2121 02/09/2019 0532 02/14/2019 0822 02/28/2019 0938 03/09/2019 1247  GLUCAP 96 129* 132* 147* 131*   Lipid Profile: Recent Labs    03/01/19 0348  CHOL 136  HDL 20*  LDLCALC 81  TRIG 425*  CHOLHDL 6.8   Thyroid Function Tests: No results for input(s): TSH, T4TOTAL, FREET4, T3FREE, THYROIDAB in the last 72 hours. Anemia Panel: No results for input(s): VITAMINB12, FOLATE, FERRITIN, TIBC, IRON, RETICCTPCT in the last 72 hours. Sepsis Labs: Recent Labs  Lab 02/12/2019 1450 03/05/2019 1801  LATICACIDVEN 2.3* 2.0*    Recent Results (from the past 240 hour(s))  Urine culture     Status: None   Collection Time: 03/01/2019  4:04 PM  Result Value Ref Range Status   Specimen Description URINE, CATHETERIZED  Final   Special Requests Normal  Final   Culture   Final    NO GROWTH Performed at [facility], [address], [place], [place] [address]    Report Status 02/28/2019 FINAL  Final  Surgical pcr screen     Status: None   Collection Time: 03/01/19  5:15 PM  Result Value Ref Range Status   MRSA, PCR NEGATIVE NEGATIVE Final   Staphylococcus aureus NEGATIVE NEGATIVE Final    Comment:  (NOTE) The Xpert SA Assay (FDA approved for NASAL specimens in patients 61 years of age and older), is one component of a comprehensive surveillance program. It is not intended to diagnose infection nor to guide or monitor treatment. Performed at [facility], [address] [place], [place] [address]          Radiology Studies: Ct Angio Head W Or Wo Contrast  Result Date: 02/28/2019 CLINICAL DATA:  Stroke follow-up. 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 EXAM: CT ANGIOGRAPHY HEAD AND NECK TECHNIQUE: Multidetector CT imaging of the head and neck was performed using the standard protocol during bolus administration of intravenous contrast. Multiplanar CT image reconstructions and MIPs were obtained to evaluate the vascular anatomy. Carotid stenosis measurements (when applicable) are obtained utilizing NASCET criteria, using the distal internal carotid diameter as the denominator. CONTRAST:  25mL ISOVUE-370 IOPAMIDOL (ISOVUE-370) INJECTION 76% COMPARISON:  MRI of the brain 03/09/2019 FINDINGS: CT HEAD FINDINGS Brain: The left internal capsule infarct is again noted, now slightly lower density than on the previous CT. No new infarct is present. The remote right MCA territory encephalomalacia is stable. The ventricles are of proportionate to the degree of atrophy. No significant extraaxial fluid collection is present. The brainstem and cerebellum are within normal limits. Vascular: Atherosclerotic calcifications are present within the cavernous internal carotid arteries bilaterally. There is no hyperdense vessel. Skull: The craniocervical junction is normal. Upper cervical spine is within normal limits. Marrow signal is unremarkable. Sinuses: Scattered opacification of left ethmoid air cells are present. There are no fluid levels. The paranasal sinuses and mastoid air cells are otherwise clear. Orbits: The globes and orbits are within normal  limits. Review of the MIP images confirms the above findings CTA NECK FINDINGS  Aortic arch: A 3 vessel arch configuration is present. Minimal atherosclerotic changes are present at the great vessel origins. There is no significant stenosis or aneurysm. Right carotid system: The right common carotid artery is within normal limits. Dense calcifications are present at the right carotid bifurcation. There is a high-grade stenosis at the carotid bifurcation. Lumen is narrowed to less than 1 mm. The more distal right common carotid artery is within normal limits to the skull base. Left carotid system: The left common carotid artery demonstrates some atherosclerotic irregularity. There is a high-grade, near occlusive stenosis of the left internal carotid artery at its bifurcation. The cervical left ICA is otherwise normal. Vertebral arteries: Extensive atherosclerotic calcifications are present along the vertebral arteries bilaterally. The right vertebral artery is dominant. There is a high-grade stenosis at the proximal left vertebral artery. No significant stenosis is present in the right vertebral artery. The left vertebral artery is reconstituted at the distal V1 segment. Extensive atherosclerotic changes are present throughout the V2 segment. These are high-grade stenosis at the level of C1. Skeleton: Vertebral body heights alignment are maintained. No focal lytic or blastic lesions are present. Other neck: The soft tissues the neck are otherwise unremarkable. No focal mucosal or submucosal lesions are present. Salivary glands are within normal limits. No significant adenopathy is present. Thyroid is normal. Upper chest: Next mild dependent atelectasis is present. The lung apices are otherwise clear. Thoracic inlet is within normal limits. Review of the MIP images confirms the above findings CTA HEAD FINDINGS Anterior circulation: Atherosclerotic calcifications are present within the cavernous internal carotid  arteries bilaterally without a significant stenosis through the ICA termini. The left A1 is hypoplastic. The right A1 is normal. The anterior communicating artery is patent. ACA branch vessels are within normal limits bilaterally. There is a high-grade stenosis of the anterior right M2 segment with marked attenuation of distal branches. Diffuse irregularity present and more posterior left MCA branches. There is moderate irregularity in left MCA branches. Pial collaterals are evident. Posterior circulation: The right vertebral artery is the dominant vessel. Segmental irregularity is present in the left V4 segment without a significant stenosis. PICA origins are visualized and normal. The vertebrobasilar junction is normal. The basilar artery is normal. Both posterior cerebral arteries originate from the basilar tip. There is some irregularity of the proximal PCA vessels without significant proximal stenosis. Branch vessels are intact. Venous sinuses: The dural sinuses are patent. Anatomic variants: None Delayed phase: No pathologic enhancement is present. Infarcts are well-defined. Review of the MIP images confirms the above findings IMPRESSION: 1. High-grade bilateral proximal ICA stenoses at the carotid bifurcations. 2. 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 3. Hypoplastic left A1 segment. 4. High-grade stenosis of the anterior right M2 segment. 5. Moderate diffuse medium and distal small vessel disease in both the anterior and posterior circulations. 6. Expected evolution of left internal capsule nonhemorrhagic infarct. 7. 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 Electronically Signed   By: Marin Roberts M.D.   On: 02/28/2019 21:01   Ct Angio Neck W Or Wo Contrast  Result Date: 02/28/2019 CLINICAL DATA:  Stroke follow-up. 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 EXAM: CT  ANGIOGRAPHY HEAD AND NECK TECHNIQUE: Multidetector CT imaging of the head and neck was performed using the standard protocol during bolus administration of intravenous contrast. Multiplanar CT image reconstructions and MIPs were obtained to evaluate the vascular anatomy. Carotid stenosis  measurements (when applicable) are obtained utilizing NASCET criteria, using the distal internal carotid diameter as the denominator. CONTRAST:  75mL ISOVUE-370 IOPAMIDOL (ISOVUE-370) INJECTION 76% COMPARISON:  MRI of the brain 02/07/2019 FINDINGS: CT HEAD FINDINGS Brain: The left internal capsule infarct is again noted, now slightly lower density than on the previous CT. No new infarct is present. The remote right MCA territory encephalomalacia is stable. The ventricles are of proportionate to the degree of atrophy. No significant extraaxial fluid collection is present. The brainstem and cerebellum are within normal limits. Vascular: Atherosclerotic calcifications are present within the cavernous internal carotid arteries bilaterally. There is no hyperdense vessel. Skull: The craniocervical junction is normal. Upper cervical spine is within normal limits. Marrow signal is unremarkable. Sinuses: Scattered opacification of left ethmoid air cells are present. There are no fluid levels. The paranasal sinuses and mastoid air cells are otherwise clear. Orbits: The globes and orbits are within normal limits. Review of the MIP images confirms the above findings CTA NECK FINDINGS Aortic arch: A 3 vessel arch configuration is present. Minimal atherosclerotic changes are present at the great vessel origins. There is no significant stenosis or aneurysm. Right carotid system: The right common carotid artery is within normal limits. Dense calcifications are present at the right carotid bifurcation. There is a high-grade stenosis at the carotid bifurcation. Lumen is narrowed to less than 1 mm. The more distal right common carotid artery is within  normal limits to the skull base. Left carotid system: The left common carotid artery demonstrates some atherosclerotic irregularity. There is a high-grade, near occlusive stenosis of the left internal carotid artery at its bifurcation. The cervical left ICA is otherwise normal. Vertebral arteries: Extensive atherosclerotic calcifications are present along the vertebral arteries bilaterally. The right vertebral artery is dominant. There is a high-grade stenosis at the proximal left vertebral artery. No significant stenosis is present in the right vertebral artery. The left vertebral artery is reconstituted at the distal V1 segment. Extensive atherosclerotic changes are present throughout the V2 segment. These are high-grade stenosis at the level of C1. Skeleton: Vertebral body heights alignment are maintained. No focal lytic or blastic lesions are present. Other neck: The soft tissues the neck are otherwise unremarkable. No focal mucosal or submucosal lesions are present. Salivary glands are within normal limits. No significant adenopathy is present. Thyroid is normal. Upper chest: Next mild dependent atelectasis is present. The lung apices are otherwise clear. Thoracic inlet is within normal limits. Review of the MIP images confirms the above findings CTA HEAD FINDINGS Anterior circulation: Atherosclerotic calcifications are present within the cavernous internal carotid arteries bilaterally without a significant stenosis through the ICA termini. The left A1 is hypoplastic. The right A1 is normal. The anterior communicating artery is patent. ACA branch vessels are within normal limits bilaterally. There is a high-grade stenosis of the anterior right M2 segment with marked attenuation of distal branches. Diffuse irregularity present and more posterior left MCA branches. There is moderate irregularity in left MCA branches. Pial collaterals are evident. Posterior circulation: The right vertebral artery is the dominant  vessel. Segmental irregularity is present in the left V4 segment without a significant stenosis. PICA origins are visualized and normal. The vertebrobasilar junction is normal. The basilar artery is normal. Both posterior cerebral arteries originate from the basilar tip. There is some irregularity of the proximal PCA vessels without significant proximal stenosis. Branch vessels are intact. Venous sinuses: The dural sinuses are patent. Anatomic variants: None Delayed phase: No pathologic enhancement is present.  Infarcts are well-defined. Review of the MIP images confirms the above findings IMPRESSION: 1. High-grade bilateral proximal ICA stenoses at the carotid bifurcations. 2. 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 3. Hypoplastic left A1 segment. 4. High-grade stenosis of the anterior right M2 segment. 5. Moderate diffuse medium and distal small vessel disease in both the anterior and posterior circulations. 6. Expected evolution of left internal capsule nonhemorrhagic infarct. 7. 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 Electronically Signed   By: Marin Roberts M.D.   On: 02/28/2019 21:01   Vas [place] Vanice Sarah With/wo Tbi  Result Date: 03/01/2019 LOWER EXTREMITY DOPPLER STUDY Indications: Gangrene. High Risk Factors: Current smoker.  Comparison Study: No prior study on file Performing Technologist: Sherren Kerns RVS  Examination Guidelines: A complete evaluation includes at minimum, Doppler waveform signals and systolic blood pressure reading at the level of bilateral brachial, anterior tibial, and posterior tibial arteries, when vessel segments are accessible. Bilateral testing is considered an integral part of a complete examination. Photoelectric Plethysmograph (PPG) waveforms and toe systolic pressure readings are included as required and additional duplex testing as needed. Limited examinations for reoccurring indications  may be performed as noted.  ABI Findings: +---------+------------------+-----+-------------------+-----------------------+  Right     Rt Pressure (mmHg) Index Waveform            Comment                  +---------+------------------+-----+-------------------+-----------------------+  Brachial  183                      triphasic                                    +---------+------------------+-----+-------------------+-----------------------+  PTA       67                 0.37  dampened monophasic                          +---------+------------------+-----+-------------------+-----------------------+  DP        74                 0.40  dampened monophasic                          +---------+------------------+-----+-------------------+-----------------------+  Great Toe                                              Not done secondary to                                                            gangrene                 +---------+------------------+-----+-------------------+-----------------------+ +--------+------------------+-----+---------+-------+  Left     Lt Pressure (mmHg) Index Waveform  Comment  +--------+------------------+-----+---------+-------+  Brachial 171                      triphasic          +--------+------------------+-----+---------+-------+  PTA      185                1.01  biphasic           +--------+------------------+-----+---------+-------+  DP       176                0.96  biphasic           +--------+------------------+-----+---------+-------+ +-------+-----------+-----------+------------+------------+  ABI/TBI Today's ABI Today's TBI Previous ABI Previous TBI  +-------+-----------+-----------+------------+------------+  Right   0.40                                               +-------+-----------+-----------+------------+------------+  Left    1.01                                               +-------+-----------+-----------+------------+------------+  Summary: Right: Resting  right ankle-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 Left: Resting left ankle-brachial index is within normal range. No evidence of significant left lower extremity arterial disease.  *See table(s) above for measurements and observations.  Electronically signed by Coral Else MD on 03/01/2019 at 10:49:49 AM.    Final    Vas US Carotid  Result Date: 02/14/2019 Carotid Arterial Duplex Study Indications:                           ICA stenosis. Limitations:                           Technically difficult patient due                                        movement, cooperation, and positioning. Pre-Surgical Evaluation & Surgical     Stenosis at bifurcation only. ICA is not Correlation:                           normal past stenosis. Bifurcation is                                        located near the Hyoid Notch. Right sided                                        80 to 99% ICA stenosis originates within                                        the bifurcation and extends approximately                                        1.1 cm into the proximal ICA. There is no  apparent hemodynamically significant                                        stenosis involving the mid or distal ICA.                                        Left sided 80 to 99% ICA stenosis                                        originates within the bifurcation and                                        extends approximately 1.1 cm into the                                        proximal ICA. There is no apparent                                        hemodynamically significant stenosis                                        involving the mid or distal ICA. Performing Technologist: Chanda Busing RVT  Examination Guidelines: A complete evaluation includes B-mode imaging, spectral Doppler, color Doppler, and power Doppler as needed of all accessible portions of each vessel. Bilateral  testing is considered an integral part of a complete examination. Limited examinations for reoccurring indications may be performed as noted.  Right Carotid Findings: +----------+--------+--------+--------+--------+--------+             PSV cm/s EDV cm/s Stenosis Describe Comments  +----------+--------+--------+--------+--------+--------+  CCA Distal 63       16                                   +----------+--------+--------+--------+--------+--------+  ICA Prox   397      115      80-99%                      +----------+--------+--------+--------+--------+--------+  ICA Distal 85       31                                   +----------+--------+--------+--------+--------+--------+  Left Carotid Findings: +----------+--------+--------+--------+--------+--------+             PSV cm/s EDV cm/s Stenosis Describe Comments  +----------+--------+--------+--------+--------+--------+  CCA Distal 36       9                                    +----------+--------+--------+--------+--------+--------+  ICA Prox   358      123      80-99%                      +----------+--------+--------+--------+--------+--------+  ICA Distal 106      29                                   +----------+--------+--------+--------+--------+--------+  Summary: Right Carotid: Right sided 80 to 99% ICA stenosis originates within the                bifurcation and extends approximately 1.1 cm into the proximal                ICA. There is no apparent hemodynamically significant stenosis                involving the mid or distal ICA. Left Carotid: Left sided 80 to 99% ICA stenosis originates within the               bifurcation and extends approximately 1.1 cm into the proximal               ICA. There is no apparent hemodynamically significant stenosis               involving the mid or distal ICA.  *See table(s) above for measurements and observations.  Electronically signed by Coral Else MD on 03/23/19 at 1:35:19 PM.    Final    US Abdomen  Limited Ruq  Result Date: 03/01/2019 CLINICAL DATA:  Elevated LFTs. EXAM: ULTRASOUND ABDOMEN LIMITED RIGHT UPPER QUADRANT COMPARISON:  None. FINDINGS: Gallbladder: Physiologically distended. No gallstones or wall thickening visualized. No sonographic Murphy sign noted by sonographer. Common bile duct: Diameter: 6 mm, normal. Liver: No focal lesion identified. Diffusely increased and heterogeneous in parenchymal echogenicity. Portal vein is patent on color Doppler imaging with normal direction of blood flow towards the liver. IMPRESSION: 1. Hepatic steatosis. 2. Normal sonographic appearance of gallbladder and biliary tree. Electronically Signed   By: Narda Rutherford M.D.   On: 03/01/2019 23:36        Scheduled Meds:  aspirin EC  81 mg Oral Daily   docusate sodium  100 mg Oral BID   folic acid  1 mg Oral Daily   insulin aspart  0-9 Units Subcutaneous TID WC   multivitamin with minerals  1 tablet Oral Daily   nicotine  21 mg Transdermal Daily   pentoxifylline  400 mg Oral TID WC   sodium chloride flush  3 mL Intravenous Q12H   thiamine  100 mg Oral Daily   Continuous Infusions:  sodium chloride Stopped (03/01/19 1331)   sodium chloride 10 mL/hr at 2019-03-23 1015   ceFEPime (MAXIPIME) IV 2 g (03-23-2019 1016)   methocarbamol (ROBAXIN) IV     vancomycin 1,000 mg (March 23, 2019 1259)     LOS: 3 days    If 7PM-7AM, please contact night-coverage [email or web address] Password [facility] 23-Mar-2019, 3:31 PM

## 2019-03-02 NOTE — Progress Notes (Signed)
Pharmacy Antibiotic Note  Noah Thomas is a 58 y.o. male admitted on 02/22/2019 with wound infection.  Pharmacy has been consulted for Vanco/Cefepime dosing.  ID: [id number] for gangrene and osteo of right great toe Afebrile, WBC down 12.9, LA down 2. Scr WNL 3/24: R foot 1st ray amputation   Vanc 3/21 >> Cefepime 3/21 >>  Vanc 1 g IV Q12H for AUC 483 using SCr 1.11  3/21 UCx - neg 3/23: MRSA PCR: negative  Plan: Vanc 1 g IV Q12H  Cefepime 2gm IV Q12H F/u cultures to narrow abx vs check Vanco levels     Height: 6' (182.9 cm) Weight: 180 lb (81.6 kg) IBW/kg (Calculated) : 77.6  Temp (24hrs), Avg:98.1 F (36.7 C), Min:97.5 F (36.4 C), Max:98.6 F (37 C)  Recent Labs  Lab 03/07/2019 1450 02/20/2019 1801 02/28/19 0338 03/01/19 0348 03/23/19 0337  WBC 14.6*  --  13.9* 12.4* 12.9*  CREATININE 1.11  --  1.15 0.99 0.89  LATICACIDVEN 2.3* 2.0*  --   --   --     Estimated Creatinine Clearance: 99.3 mL/min (by C-G formula based on SCr of 0.89 mg/dL).    No Known Allergies   Mozelle Remlinger S. Merilynn Finland, PharmD, BCPS Clinical Staff Pharmacist Misty Stanley Stillinger March 23, 2019 1:13 PM

## 2019-03-03 ENCOUNTER — Inpatient Hospital Stay (HOSPITAL_COMMUNITY): Payer: Self-pay

## 2019-03-03 ENCOUNTER — Encounter (HOSPITAL_COMMUNITY): Payer: Self-pay | Admitting: Orthopedic Surgery

## 2019-03-03 ENCOUNTER — Other Ambulatory Visit: Payer: Self-pay

## 2019-03-03 ENCOUNTER — Encounter (HOSPITAL_COMMUNITY): Admission: EM | Disposition: E | Payer: Self-pay | Source: Home / Self Care | Attending: Internal Medicine

## 2019-03-03 DIAGNOSIS — I639 Cerebral infarction, unspecified: Secondary | ICD-10-CM

## 2019-03-03 DIAGNOSIS — Z515 Encounter for palliative care: Secondary | ICD-10-CM

## 2019-03-03 DIAGNOSIS — Z66 Do not resuscitate: Secondary | ICD-10-CM

## 2019-03-03 DIAGNOSIS — F22 Delusional disorders: Secondary | ICD-10-CM

## 2019-03-03 HISTORY — PX: ABDOMINAL AORTOGRAM W/LOWER EXTREMITY: CATH118223

## 2019-03-03 LAB — COMPREHENSIVE METABOLIC PANEL
ALT: 62 U/L — ABNORMAL HIGH (ref 0–44)
AST: 59 U/L — ABNORMAL HIGH (ref 15–41)
Albumin: 2.4 g/dL — ABNORMAL LOW (ref 3.5–5.0)
Alkaline Phosphatase: 75 U/L (ref 38–126)
Anion gap: 8 (ref 5–15)
BUN: 14 mg/dL (ref 6–20)
CO2: 23 mmol/L (ref 22–32)
Calcium: 8.1 mg/dL — ABNORMAL LOW (ref 8.9–10.3)
Chloride: 103 mmol/L (ref 98–111)
Creatinine, Ser: 1.05 mg/dL (ref 0.61–1.24)
GFR calc Af Amer: >60 mL/min (ref >60)
GFR calc non Af Amer: >60 mL/min (ref >60)
Glucose, Bld: 156 mg/dL — ABNORMAL HIGH (ref 70–99)
Potassium: 3.2 mmol/L — ABNORMAL LOW (ref 3.5–5.1)
SODIUM: 134 mmol/L — AB (ref 135–145)
Total Bilirubin: 0.5 mg/dL (ref 0.3–1.2)
Total Protein: 5.3 g/dL — ABNORMAL LOW (ref 6.5–8.1)

## 2019-03-03 LAB — ECHOCARDIOGRAM LIMITED
Height: 72 in
Weight: 3844.82 oz

## 2019-03-03 LAB — CBC
HCT: 41.4 % (ref 39.0–52.0)
Hemoglobin: 13.8 g/dL (ref 13.0–17.0)
MCH: 29.2 pg (ref 26.0–34.0)
MCHC: 33.3 g/dL (ref 30.0–36.0)
MCV: 87.7 fL (ref 80.0–100.0)
NRBC: 0 % (ref 0.0–0.2)
Platelets: 253 10*3/uL (ref 150–400)
RBC: 4.72 MIL/uL (ref 4.22–5.81)
RDW: 12.7 % (ref 11.5–15.5)
WBC: 12.6 10*3/uL — ABNORMAL HIGH (ref 4.0–10.5)

## 2019-03-03 LAB — GLUCOSE, CAPILLARY
GLUCOSE-CAPILLARY: 140 mg/dL — AB (ref 70–99)
Glucose-Capillary: 139 mg/dL — ABNORMAL HIGH (ref 70–99)
Glucose-Capillary: 129 mg/dL — ABNORMAL HIGH (ref 70–99)
Glucose-Capillary: 249 mg/dL — ABNORMAL HIGH (ref 70–99)

## 2019-03-03 SURGERY — ABDOMINAL AORTOGRAM W/LOWER EXTREMITY
Anesthesia: LOCAL

## 2019-03-03 MED ORDER — LABETALOL HCL 5 MG/ML IV SOLN
INTRAVENOUS | Status: DC | PRN
  Administered 2019-03-03: 10 mg via INTRAVENOUS

## 2019-03-03 MED ORDER — LIDOCAINE HCL (PF) 1 % IJ SOLN
INTRAMUSCULAR | Status: AC
  Filled 2019-03-03: qty 30

## 2019-03-03 MED ORDER — SODIUM CHLORIDE 0.9 % IV SOLN
INTRAVENOUS | Status: AC
  Administered 2019-03-03: 12:00:00 via INTRAVENOUS

## 2019-03-03 MED ORDER — PNEUMOCOCCAL VAC POLYVALENT 25 MCG/0.5ML IJ INJ: 0.5000 mL | INJECTION | INTRAMUSCULAR | Status: DC

## 2019-03-03 MED ORDER — INFLUENZA VAC SPLIT QUAD 0.5 ML IM SUSY: 0.5000 mL | PREFILLED_SYRINGE | INTRAMUSCULAR | Status: DC

## 2019-03-03 MED ORDER — ACETAMINOPHEN 325 MG PO TABS: 650.0000 mg | ORAL_TABLET | ORAL | Status: DC | PRN

## 2019-03-03 MED ORDER — HEPARIN (PORCINE) IN NACL 1000-0.9 UT/500ML-% IV SOLN
INTRAVENOUS | Status: AC
  Filled 2019-03-03: qty 1000

## 2019-03-03 MED ORDER — LABETALOL HCL 5 MG/ML IV SOLN
INTRAVENOUS | Status: AC
  Filled 2019-03-03: qty 4

## 2019-03-03 MED ORDER — LIVING WELL WITH DIABETES BOOK
Freq: Once | Status: AC
  Administered 2019-03-03: 03:00:00

## 2019-03-03 MED ORDER — SODIUM CHLORIDE 0.9% FLUSH: 3.0000 mL | INTRAVENOUS | Status: DC | PRN

## 2019-03-03 MED ORDER — HYDRALAZINE HCL 20 MG/ML IJ SOLN: 5.0000 mg | INTRAMUSCULAR | Status: DC | PRN

## 2019-03-03 MED ORDER — LIVING WELL WITH DIABETES BOOK
Freq: Once | Status: AC
  Administered 2019-03-03: 17:00:00

## 2019-03-03 MED ORDER — SODIUM CHLORIDE 0.9 % IV SOLN: 250.0000 mL | INTRAVENOUS | Status: DC | PRN

## 2019-03-03 MED ORDER — LIDOCAINE HCL (PF) 1 % IJ SOLN
INTRAMUSCULAR | Status: DC | PRN
  Administered 2019-03-03: 15 mL via INTRADERMAL

## 2019-03-03 MED ORDER — IODIXANOL 320 MG/ML IV SOLN
INTRAVENOUS | Status: DC | PRN
  Administered 2019-03-03: 97 mL via INTRA_ARTERIAL

## 2019-03-03 MED ORDER — HEPARIN (PORCINE) IN NACL 1000-0.9 UT/500ML-% IV SOLN
INTRAVENOUS | Status: DC | PRN
  Administered 2019-03-03 (×2): 500 mL

## 2019-03-03 MED ORDER — ONDANSETRON HCL 4 MG/2ML IJ SOLN: 4.0000 mg | Freq: Four times a day (QID) | INTRAMUSCULAR | Status: DC | PRN

## 2019-03-03 MED ORDER — SODIUM CHLORIDE 0.9% FLUSH
3.0000 mL | Freq: Two times a day (BID) | INTRAVENOUS | Status: DC
  Administered 2019-03-03: 3 mL via INTRAVENOUS

## 2019-03-03 MED ORDER — LABETALOL HCL 5 MG/ML IV SOLN
10.0000 mg | INTRAVENOUS | Status: DC | PRN
  Administered 2019-03-03: 10 mg via INTRAVENOUS
  Filled 2019-03-03 (×2): qty 4

## 2019-03-03 SURGICAL SUPPLY — 10 items
CATH OMNI FLUSH 5F 65CM (CATHETERS) ×2 IMPLANT
CLOSURE MYNX CONTROL 5F (Vascular Products) ×2 IMPLANT
KIT MICROPUNCTURE NIT STIFF (SHEATH) ×2 IMPLANT
KIT PV (KITS) ×2 IMPLANT
SHEATH PINNACLE 5F 10CM (SHEATH) ×2 IMPLANT
SHEATH PROBE COVER 6X72 (BAG) ×2 IMPLANT
SYR MEDRAD MARK V 150ML (SYRINGE) ×2 IMPLANT
TRANSDUCER W/STOPCOCK (MISCELLANEOUS) ×2 IMPLANT
TRAY PV CATH (CUSTOM PROCEDURE TRAY) ×2 IMPLANT
WIRE BENTSON .035X145CM (WIRE) ×2 IMPLANT

## 2019-03-03 NOTE — NC FL2 (Signed)
[place] MEDICAID FL2 LEVEL OF CARE SCREENING TOOL     IDENTIFICATION  Patient Name: Noah Thomas Birthdate: [birth year] Sex: male Admission Date (Current Location): 03/09/19  [facility] and [place] Number:  Producer, television/film/video and Address:  [facility], [address] [place], [place] [address]      Provider Number: [id number]  Attending Physician Name and Address:  Cathren Harsh, MD  Relative Name and Phone Number:  Brysan Mcevoy    Current Level of Care: Hospital Recommended Level of Care: Skilled Nursing Facility Prior Approval Number:    Date Approved/Denied:   PASRR Number: [id number]  Discharge Plan: SNF    Current Diagnoses: Patient Active Problem List   Diagnosis Date Noted  . Paranoia (HCC)   . Chronic mental illness 02/21/2019  . Right great toe amputee (HCC)   . Cerebral thrombosis with cerebral infarction 03/01/2019  . New onset type 2 diabetes mellitus (HCC) 03/01/2019  . Internal carotid artery stenosis 03/01/2019  . Acute lacunar stroke (HCC) 03/01/2019  . Abnormal LFTs   . Osteomyelitis of great toe of right foot (HCC)   . Gangrene of toe of right foot (HCC)   . Moderate protein-calorie malnutrition (HCC)   . Smoking   . Acute metabolic encephalopathy 03/09/2019  . Dehydration 03-09-19  . Cellulitis 2019/03/09  . Adult failure to thrive 03-09-19    Orientation RESPIRATION BLADDER Height & Weight     Self, Time, Situation, Place  Normal Incontinent Weight: 240 lb 4.8 oz (109 kg)(weighed twice/equipment off bed) Height:  6' (182.9 cm)  BEHAVIORAL SYMPTOMS/MOOD NEUROLOGICAL BOWEL NUTRITION STATUS      Continent Diet(please see discharge summary )  AMBULATORY STATUS COMMUNICATION OF NEEDS Skin     Verbally Surgical wounds, Wound Vac(incision(closed) foot, right, incision(open or dehisced) non pressure wound elbow, right, incison(opne or dehisced) non pressure wound, elbow, left, non pressure wound toe, right,  neagative pressure wound therapy, foot, right)                       Personal Care Assistance Level of Assistance  Bathing, Feeding, Dressing Bathing Assistance: Limited assistance Feeding assistance: Independent Dressing Assistance: Limited assistance     Functional Limitations Info  Sight, Hearing, Speech Sight Info: Adequate Hearing Info: Adequate Speech Info: Adequate    SPECIAL CARE FACTORS FREQUENCY  PT (By licensed PT), OT (By licensed OT)     PT Frequency: 5x per week OT Frequency: 5x per week             Contractures Contractures Info: Not present    Additional Factors Info  Code Status, Allergies, Insulin Sliding Scale Code Status Info: FULL Allergies Info: NKA   Insulin Sliding Scale Info: insulin aspart (novoLOG) injection 0-9 Units , 3x daily with meals       Current Medications (02/14/2019):  This is the current hospital active medication list Current Facility-Administered Medications  Medication Dose Route Frequency Provider Last Rate Last Dose  . 0.45 % sodium chloride infusion   Intravenous Continuous Cephus Shelling, MD 50 mL/hr at 02/09/2019 0300    . 0.9 %  sodium chloride infusion  250 mL Intravenous PRN Cephus Shelling, MD      . acetaminophen (TYLENOL) tablet 650 mg  650 mg Oral Q6H PRN Cephus Shelling, MD       Or  . acetaminophen (TYLENOL) suppository 650 mg  650 mg Rectal Q6H PRN Cephus Shelling, MD      .  aspirin EC tablet 81 mg  81 mg Oral Daily Cephus Shelling, MD   81 mg at 02/07/2019 1019  . bisacodyl (DULCOLAX) suppository 10 mg  10 mg Rectal Daily PRN Cephus Shelling, MD      . docusate sodium (COLACE) capsule 100 mg  100 mg Oral BID Cephus Shelling, MD   100 mg at 02/13/2019 2351  . folic acid (FOLVITE) tablet 1 mg  1 mg Oral Daily Cephus Shelling, MD   1 mg at 03/01/2019 1019  . hydrALAZINE (APRESOLINE) injection 5 mg  5 mg Intravenous Q20 Min PRN Cephus Shelling, MD      . HYDROmorphone  (DILAUDID) injection 0.5-1 mg  0.5-1 mg Intravenous Q4H PRN Cephus Shelling, MD      . Melene Muller ON 03/04/2019] Influenza vac split quadrivalent PF (FLUARIX) injection 0.5 mL  0.5 mL Intramuscular Tomorrow-1000 Cephus Shelling, MD      . insulin aspart (novoLOG) injection 0-9 Units  0-9 Units Subcutaneous TID WC Cephus Shelling, MD   1 Units at 03/23/19 [phone]  . labetalol (NORMODYNE,TRANDATE) injection 10 mg  10 mg Intravenous Q10 min PRN Cephus Shelling, MD      . living well with diabetes book MISC   Does not apply Once Rai, Ripudeep K, MD      . magnesium citrate solution 1 Bottle  1 Bottle Oral Once PRN Cephus Shelling, MD      . methocarbamol (ROBAXIN) tablet 500 mg  500 mg Oral Q6H PRN Cephus Shelling, MD       Or  . methocarbamol (ROBAXIN) 500 mg in dextrose 5 % 50 mL IVPB  500 mg Intravenous Q6H PRN Cephus Shelling, MD      . multivitamin with minerals tablet 1 tablet  1 tablet Oral Daily Cephus Shelling, MD   1 tablet at 03/01/2019 1019  . nicotine (NICODERM CQ - dosed in mg/24 hours) patch 21 mg  21 mg Transdermal Daily Cephus Shelling, MD   21 mg at 03/23/2019 1326  . ondansetron (ZOFRAN) tablet 4 mg  4 mg Oral Q6H PRN Cephus Shelling, MD       Or  . ondansetron [facility]) injection 4 mg  4 mg Intravenous Q6H PRN Cephus Shelling, MD      . oxyCODONE (Oxy IR/ROXICODONE) immediate release tablet 10-15 mg  10-15 mg Oral Q4H PRN Cephus Shelling, MD      . oxyCODONE (Oxy IR/ROXICODONE) immediate release tablet 5-10 mg  5-10 mg Oral Q4H PRN Cephus Shelling, MD      . pentoxifylline (TRENTAL) CR tablet 400 mg  400 mg Oral TID WC Cephus Shelling, MD   400 mg at 03/01/2019 1621  . [START ON 03/04/2019] pneumococcal 23 valent vaccine (PNU-IMMUNE) injection 0.5 mL  0.5 mL Intramuscular Tomorrow-1000 Cephus Shelling, MD      . polyethylene glycol (MIRALAX / GLYCOLAX) packet 17 g  17 g Oral Daily PRN Cephus Shelling, MD      .  senna-docusate (Senokot-S) tablet 1 tablet  1 tablet Oral QHS PRN Cephus Shelling, MD      . sodium chloride flush (NS) 0.9 % injection 10-40 mL  10-40 mL Intracatheter PRN Cephus Shelling, MD   10 mL at 03/23/19 0021  . sodium chloride flush (NS) 0.9 % injection 3 mL  3 mL Intravenous Q12H Cephus Shelling, MD   3 mL at 03/23/2019 0804  .  sodium chloride flush (NS) 0.9 % injection 3 mL  3 mL Intravenous Q12H Cephus Shelling, MD      . sodium chloride flush (NS) 0.9 % injection 3 mL  3 mL Intravenous PRN Cephus Shelling, MD      . thiamine (VITAMIN B-1) tablet 100 mg  100 mg Oral Daily Cephus Shelling, MD   100 mg at 15-Mar-2019 1019     Discharge Medications: Please see discharge summary for a list of discharge medications.  Relevant Imaging Results:  Relevant Lab Results:   Additional Information SSN# [id number]  Eduard Roux, LCSWA

## 2019-03-03 NOTE — TOC Progression Note (Signed)
Transition of Care [facility]) - Progression Note    Patient Details  Name: Noah Thomas MRN: [id number] Date of Birth: 1961-01-06  Transition of [facility]) CM/SW Contact  Eduard Roux, [place] Phone Number: 02/22/2019, 5:52 PM  Clinical Narrative:    CSW spoke with the patient's brother, Jillyn Hidden this evening and he states his brother can stay with him. He still wants his brother to get rehab at [facility].    Expected Discharge Plan: Skilled Nursing Facility Barriers to Discharge: Continued Medical Work up  Expected Discharge Plan and Services Expected Discharge Plan: Skilled Nursing Facility In-house Referral: Clinical Social Work Discharge Planning Services: NA   Living arrangements for the past 2 months: Mobile Home                 DME Arranged: N/A DME Agency: NA HH Arranged: NA HH Agency: NA   Social Determinants of Health (SDOH) Interventions    Readmission Risk Interventions No flowsheet data found.

## 2019-03-03 NOTE — Consult Note (Addendum)
[facility] Face-to-Face Psychiatry Consult   Reason for Consult:  "longterm mental illness , no doctors or medications now in hospital w/ new DM, toe gangrene, CVA, please assist in psych diagnosis and rec's" Referring Physician:  Dr. Thad Ranger Patient Identification: Noah Thomas MRN:  [id number] Principal Diagnosis: Paranoia (HCC) Diagnosis:  Principal Problem:   Gangrene of toe of right foot (HCC) Active Problems:   Acute metabolic encephalopathy   Dehydration   Cellulitis   Adult failure to thrive   Osteomyelitis of great toe of right foot (HCC)   Moderate protein-calorie malnutrition (HCC)   Smoking   Cerebral thrombosis with cerebral infarction   New onset type 2 diabetes mellitus (HCC)   Internal carotid artery stenosis   Acute lacunar stroke (HCC)   Abnormal LFTs   Right great toe amputee (HCC)   Chronic mental illness   Total Time spent with patient: 1 hour  Subjective:   Noah Thomas is a 58 y.o. male patient admitted with right toe gangrene.  HPI:   Per chart review, patient was admitted with altered mental status in the setting of right toe gangrene s/p amputation. He was found at home on the floor in filthy conditions by his brother. Patient's brother reports that the patient had onset of paranoid in his teenage years. He required admission to [facility] at some point. He was cared for my his parents until they passed. He lives alone in a trailer. He is independent of his ADLs. His brother manages his finances. He has access to a debit care for purchases. He lives in [place] conditions at baseline. Hs trailer may be condemned. He does not receive treatment for his mental health.   On interview, Noah Thomas reports that he came to the hospital at the first of the month although he is unable to clarify what he means.  He denies undergoing surgery and appears to be unaware of his medical condition.  He believes that the date is 06/24/1944 and that he is 58 years old.  He is  oriented to place.  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  He denies a history of depression or anxiety.  He denies feelings of paranoia.  He reports that the treatment team has treated him well.  He denies SI, HI or AVH.  He denies problems with sleep or appetite.  He provides verbal consent to speak to his brother.  Patient's brother was contacted by phone.  He reports that the patient was normal until 58 years old and he became reclusive and stayed in his bedroom on most days.  He endorsed hearing voices.  He had periods of agitation where he would destroy property (such as punch holes in walls).  His parents called the police and he was taken to [facility] for psychiatric care.  He is unsure of his diagnosis.  He dropped out of high school and has been living alone since he was about 43 years old.  His parents paid for his housing while living.  He has destroyed several places due to living in filth.  His brother has been taking care of him financially since Apr 20, 2015 after his father passed away.  He uses a debit card to make purchases for items such as food and gasoline.  He reports that he has intermittent periods of paranoia and delusional thoughts where he believes the world is against him.  These periods last from a few hours to a couple days.  He reports that his  paranoia has improved over the years because in the past it lead to episodes of aggression.  He is unable to return to his trailer because it will be condemned next week and he is unable to live with his brother.    Past Psychiatric History: Psychosis   Risk to Self:  None. Denies SI.  Risk to Others:  None. Denies HI.  Prior Inpatient Therapy:  He [facility] at [facility] as a teenager for aggression and psychosis.  Prior Outpatient Therapy:  Denies   Past Medical History:  Past Medical History:  Diagnosis Date  . Osteomyelitis (HCC) 02/2019   RIGHT GREAT TOE    Past Surgical History:  Procedure Laterality Date  .  AMPUTATION Right 02/09/2019   Procedure: Right Foot 1st Ray Amputation;  Surgeon: Nadara Mustarduda, Marcus V, MD;  Location[facility] OR;  Service: Orthopedics;  Laterality: Right;  . AMPUTATION TOE Right 02/26/2019   IST GREAT TOE   Family History: History reviewed. No pertinent family history. Family Psychiatric  History: Denies  Social History:  Social History   Substance and Sexual Activity  Alcohol Use Yes   Comment: Not much as per brother.     Social History   Substance and Sexual Activity  Drug Use Not Currently    Social History   Socioeconomic History  . Marital status: Single    Spouse name: Not on file  . Number of children: Not on file  . Years of education: Not on file  . Highest education level: Not on file  Occupational History  . Not on file  Social Needs  . Financial resource strain: Not on file  . Food insecurity:    Worry: Not on file    Inability: Not on file  . Transportation needs:    Medical: Not on file    Non-medical: Not on file  Tobacco Use  . Smoking status: Current Every Day Smoker  . Smokeless tobacco: Never Used  Substance and Sexual Activity  . Alcohol use: Yes    Comment: Not much as per brother.  . Drug use: Not Currently  . Sexual activity: Not Currently  Lifestyle  . Physical activity:    Days per week: Patient refused    Minutes per session: Patient refused  . Stress: Not on file  Relationships  . Social connections:    Talks on phone: Patient refused    Gets together: Patient refused    Attends religious service: Patient refused    Active member of club or organization: Patient refused    Attends meetings of clubs or organizations: Patient refused    Relationship status: Patient refused  Other Topics Concern  . Not on file  Social History Narrative  . Not on file   Additional Social History: He lives alone in a trailer. He denies alcohol or illicit substance use.     Allergies:  No Known Allergies  Labs:  Results for orders  placed or performed during the hospital encounter of 02/26/2019 (from the past 48 hour(s))  Glucose, capillary     Status: Abnormal   Collection Time: 03/01/19 12:39 PM  Result Value Ref Range   Glucose-Capillary 121 (H) 70 - 99 mg/dL  Glucose, capillary     Status: Abnormal   Collection Time: 03/01/19  4:36 PM  Result Value Ref Range   Glucose-Capillary 145 (H) 70 - 99 mg/dL  Surgical pcr screen     Status: None   Collection Time: 03/01/19  5:15 PM  Result Value  Ref Range   MRSA, PCR NEGATIVE NEGATIVE   Staphylococcus aureus NEGATIVE NEGATIVE    Comment: (NOTE) The Xpert SA Assay (FDA approved for NASAL specimens in patients 15 years of age and older), is one component of a comprehensive surveillance program. It is not intended to diagnose infection nor to guide or monitor treatment. Performed at [facility], [address] [place], [place] [address]   Glucose, capillary     Status: None   Collection Time: 03/01/19  9:21 PM  Result Value Ref Range   Glucose-Capillary 96 70 - 99 mg/dL  CBC     Status: Abnormal   Collection Time: 03/09/2019  3:37 AM  Result Value Ref Range   WBC 12.9 (H) 4.0 - 10.5 K/uL   RBC 5.57 4.22 - 5.81 MIL/uL   Hemoglobin 16.9 13.0 - 17.0 g/dL   HCT 45.8 09.9 - 83.3 %   MCV 86.5 80.0 - 100.0 fL   MCH 30.3 26.0 - 34.0 pg   MCHC 35.1 30.0 - 36.0 g/dL   RDW 82.5 05.3 - 97.6 %   Platelets 219 150 - 400 K/uL   nRBC 0.0 0.0 - 0.2 %    Comment: Performed at [facility], [address], [place], [place] [address]  Basic metabolic panel     Status: Abnormal   Collection Time: 03/01/2019  3:37 AM  Result Value Ref Range   Sodium 136 135 - 145 mmol/L   Potassium 3.8 3.5 - 5.1 mmol/L   Chloride 103 98 - 111 mmol/L   CO2 23 22 - 32 mmol/L   Glucose, Bld 143 (H) 70 - 99 mg/dL   BUN 13 6 - 20 mg/dL   Creatinine, Ser 3.79 0.61 - 1.24 mg/dL   Calcium 8.8 (L) 8.9 - 10.3 mg/dL   GFR calc non Af Amer >60 >60 mL/min   GFR calc Af Amer >60 >60 mL/min    Anion gap 10 5 - 15    Comment: Performed at [facility], [address], [place], [place] [address]  Glucose, capillary     Status: Abnormal   Collection Time: 03/05/2019  5:32 AM  Result Value Ref Range   Glucose-Capillary 129 (H) 70 - 99 mg/dL  Glucose, capillary     Status: Abnormal   Collection Time: 02/14/2019  8:22 AM  Result Value Ref Range   Glucose-Capillary 132 (H) 70 - 99 mg/dL  Glucose, capillary     Status: Abnormal   Collection Time: 03/07/2019  9:38 AM  Result Value Ref Range   Glucose-Capillary 147 (H) 70 - 99 mg/dL  Glucose, capillary     Status: Abnormal   Collection Time: 03/05/2019 12:47 PM  Result Value Ref Range   Glucose-Capillary 131 (H) 70 - 99 mg/dL  Glucose, capillary     Status: Abnormal   Collection Time: 03/09/2019  4:37 PM  Result Value Ref Range   Glucose-Capillary 177 (H) 70 - 99 mg/dL  Glucose, capillary     Status: Abnormal   Collection Time: 02/11/2019  9:27 PM  Result Value Ref Range   Glucose-Capillary 164 (H) 70 - 99 mg/dL  CBC     Status: Abnormal   Collection Time: 02/08/2019  3:16 AM  Result Value Ref Range   WBC 12.6 (H) 4.0 - 10.5 K/uL   RBC 4.72 4.22 - 5.81 MIL/uL   Hemoglobin 13.8 13.0 - 17.0 g/dL   HCT 73.5 32.9 - 92.4 %   MCV 87.7 80.0 -  100.0 fL   MCH 29.2 26.0 - 34.0 pg   MCHC 33.3 30.0 - 36.0 g/dL   RDW 16.1 09.6 - 04.5 %   Platelets 253 150 - 400 K/uL   nRBC 0.0 0.0 - 0.2 %    Comment: Performed at [facility], [address] [place], [place] [address]  Comprehensive metabolic panel     Status: Abnormal   Collection Time: 02/16/2019  3:16 AM  Result Value Ref Range   Sodium 134 (L) 135 - 145 mmol/L   Potassium 3.2 (L) 3.5 - 5.1 mmol/L   Chloride 103 98 - 111 mmol/L   CO2 23 22 - 32 mmol/L   Glucose, Bld 156 (H) 70 - 99 mg/dL   BUN 14 6 - 20 mg/dL   Creatinine, Ser 1.91 0.61 - 1.24 mg/dL   Calcium 8.1 (L) 8.9 - 10.3 mg/dL   Total Protein 5.3 (L) 6.5 - 8.1 g/dL   Albumin 2.4 (L) 3.5 - 5.0 g/dL   AST 59 (H) 15 - 41  U/L   ALT 62 (H) 0 - 44 U/L   Alkaline Phosphatase 75 38 - 126 U/L   Total Bilirubin 0.5 0.3 - 1.2 mg/dL   GFR calc non Af Amer >60 >60 mL/min   GFR calc Af Amer >60 >60 mL/min   Anion gap 8 5 - 15    Comment: Performed at [facility], [address] [place], [place] [address]  Glucose, capillary     Status: Abnormal   Collection Time: 03/06/2019  7:41 AM  Result Value Ref Range   Glucose-Capillary 139 (H) 70 - 99 mg/dL    Current Facility-Administered Medications  Medication Dose Route Frequency Provider Last Rate Last Dose  . 0.45 % sodium chloride infusion   Intravenous Continuous Delano Metz, MD 50 mL/hr at 02/28/2019 0300    . acetaminophen (TYLENOL) tablet 650 mg  650 mg Oral Q6H PRN [name], MD       Or  . acetaminophen (TYLENOL) suppository 650 mg  650 mg Rectal Q6H PRN [name], MD      . aspirin EC tablet 81 mg  81 mg Oral Daily [name], MD   81 mg at 02/28/2019 1019  . bisacodyl (DULCOLAX) suppository 10 mg  10 mg Rectal Daily PRN [name], MD      . ceFEPIme (MAXIPIME) 2 g in sodium chloride 0.9 % 100 mL IVPB  2 g Intravenous Q12H Delano Metz, MD   Stopped at 02/26/2019 [phone]  . docusate sodium (COLACE) capsule 100 mg  100 mg Oral BID [name], MD   100 mg at 02/11/2019 2351  . folic acid (FOLVITE) tablet 1 mg  1 mg Oral Daily [name], MD   1 mg at 02/14/2019 1019  . HYDROmorphone (DILAUDID) injection 0.5-1 mg  0.5-1 mg Intravenous Q4H PRN [name], MD      . Melene Muller ON 03/04/2019] Influenza vac split quadrivalent PF (FLUARIX) injection 0.5 mL  0.5 mL Intramuscular Tomorrow-1000 [name], MD      . insulin aspart (novoLOG) injection 0-9 Units  0-9 Units Subcutaneous TID WC [name], MD   1 Units at 03/07/2019 [phone]  . magnesium citrate solution 1 Bottle  1 Bottle Oral Once PRN [name], MD      . methocarbamol (ROBAXIN) tablet 500 mg  500 mg Oral Q6H PRN [name], MD  Or  . methocarbamol (ROBAXIN) 500  mg in dextrose 5 % 50 mL IVPB  500 mg Intravenous Q6H PRN [name], MD      . multivitamin with minerals tablet 1 tablet  1 tablet Oral Daily [name], MD   1 tablet at 03/01/2019 1019  . nicotine (NICODERM CQ - dosed in mg/24 hours) patch 21 mg  21 mg Transdermal Daily [name], MD   21 mg at 03/01/2019 1018  . ondansetron (ZOFRAN) tablet 4 mg  4 mg Oral Q6H PRN [name], MD       Or  . ondansetron [facility]) injection 4 mg  4 mg Intravenous Q6H PRN [name], MD      . oxyCODONE (Oxy IR/ROXICODONE) immediate release tablet 10-15 mg  10-15 mg Oral Q4H PRN [name], MD      . oxyCODONE (Oxy IR/ROXICODONE) immediate release tablet 5-10 mg  5-10 mg Oral Q4H PRN [name], MD      . pentoxifylline (TRENTAL) CR tablet 400 mg  400 mg Oral TID WC [name], MD   400 mg at 02/18/2019 1621  . [START ON 03/04/2019] pneumococcal 23 valent vaccine (PNU-IMMUNE) injection 0.5 mL  0.5 mL Intramuscular Tomorrow-1000 [name], MD      . polyethylene glycol (MIRALAX / GLYCOLAX) packet 17 g  17 g Oral Daily PRN [name], MD      . senna-docusate (Senokot-S) tablet 1 tablet  1 tablet Oral QHS PRN [name], MD      . sodium chloride flush (NS) 0.9 % injection 10-40 mL  10-40 mL Intracatheter PRN [name], MD   10 mL at 03/04/2019 0021  . sodium chloride flush (NS) 0.9 % injection 3 mL  3 mL Intravenous Q12H [name], MD   3 mL at 02/19/2019 0804  . thiamine (VITAMIN B-1) tablet 100 mg  100 mg Oral Daily [name], MD   100 mg at 02/15/2019 1019  . vancomycin (VANCOCIN) 1,000 mg in sodium chloride 0.9 % 250 mL IVPB  1,000 mg Intravenous Q12H Delano Metz, MD   Stopped at 02/20/2019 0804    Musculoskeletal: Strength & Muscle Tone: within normal limits Gait & Station: UTA since patient is lying in bed. Patient leans: N/A  Psychiatric Specialty Exam: Physical Exam  Nursing note and vitals reviewed. Constitutional: He is oriented to person, place,  and time. He appears well-developed and well-nourished.  HENT:  Head: Normocephalic and atraumatic.  Neck: Normal range of motion.  Respiratory: Effort normal.  Musculoskeletal: Normal range of motion.  Neurological: He is alert and oriented to person, place, and time.  Psychiatric: His speech is normal and behavior is normal. Judgment and thought content normal. His affect is blunt. Cognition and memory are impaired.    Review of Systems  Cardiovascular: Negative for chest pain.  Gastrointestinal: Negative for abdominal pain, constipation, diarrhea, nausea and vomiting.  Psychiatric/Behavioral: Negative for depression, hallucinations, substance abuse and suicidal ideas. The patient is not nervous/anxious and does not have insomnia.   All other systems reviewed and are negative.   Blood pressure (!) 167/91, pulse 94, temperature 98.6 F (37 C), temperature source Oral, resp. rate 20, height 6' (1.829 m), weight 109 kg, SpO2 96 %.Body mass index is 32.59 kg/m.  General Appearance: Disheveled, middle aged, Caucasian male, wearing a hospital gown with long, unkept beard who is lying in bed. NAD.  Eye Contact:  Poor  Speech:  Clear and Coherent and Normal Rate  Volume:  Normal  Mood:  Euthymic  Affect:  Blunt  Thought Process:  Linear and Descriptions of Associations: Intact  Orientation:  Other:  Oriented to person and place.  Thought Content:  Illogical  Suicidal Thoughts:  No  Homicidal Thoughts:  No  Memory:  Immediate;   Poor Recent;   Fair Remote;   Fair  Judgement:  Impaired  Insight:  Lacking  Psychomotor Activity:  Normal  Concentration:  Concentration: Fair and Attention Span: Fair  Recall:  Poor  Fund of Knowledge:  Fair  Language:  Fair  Akathisia:  No  Handed:  Right  AIMS (if indicated):   N/A  Assets:  Architect Housing Resilience Social Support  ADL's:  Intact  Cognition: Impaired with short term memory deficits.    Sleep:   Okay   Assessment:  Noah Thomas is a 58 y.o. male who was admitted with altered mental status in the setting of right toe gangrene s/p amputation. Patient is oriented to person and place. He has no insight about his medical condition and therefore lacks capacity to make decisions related to his current medical treatment. He is bizarre and illogical. He is blunt in affect. He denies SI, HI or AVH. He does not appear to be responding to internal stimuli. It appears he had onset of psychosis and withdrawal from others when he was an early teen which is likely suggestive of a primary psychotic disorder such as schizophrenia. He denies current paranoia although his brother reports episodic periods of paranoia. Recommend Risperdal for psychosis. Patient will likely need to speak to SW about housing resources given history of unstable living conditions. He will need resources to follow up with an outpatient psychiatrist for medication management.   Treatment Plan Summary: -Start Risperdal 0.5 mg BID for chronic paranoia.  -Repeat EKG to monitor for QTc prolongation prior to starting Risperdal. EKG reviewed and QTc 514 on 3/21.  -Please have SW provide patient with outpatient mental health resources for medication management.  -Psychiatry will sign off on patient at this time. Please consult psychiatry again as needed.   Disposition: No evidence of imminent risk to self or others at present.   Patient does not meet criteria for psychiatric inpatient admission.  Cherly Beach, DO 02/09/2019 9:14 AM

## 2019-03-03 NOTE — Progress Notes (Signed)
OT / PT Cancellation Note  Patient Details Name: Noah Thomas MRN: [id number] DOB: 1961/07/05   Cancelled Treatment:    Reason Eval/Treat Not Completed: Patient at procedure or test/ unavailable( aortogram pending with transport on way) OT /PT to reassess s/p procedure as appropriate.  Fontaine No, OTR/L  Acute Rehabilitation Services Pager: [phone] Office: [phone] .  02/17/2019, 8:24 AM

## 2019-03-03 NOTE — Progress Notes (Signed)
Physical Therapy Treatment Patient Details Name: Noah Thomas MRN: [id number] DOB: [birth year] Today's Date: 03/06/2019    History of Present Illness Pt is a 58 y.o. male admitted 02/12/2019 with AMS after found down for unknown time by EMS with home in filth conditions. Worked up for acute/subacute L internal capsule and basal ganglia CVA, sepsis due to R foot cellulitis, R distal 1st phalanx fx, and rhabdomyolysis. Imaging also showed remote encephalomalacia of right MCA territory with associated wallerian degeneration. S/p R 1st ray amputation 3/24. S/p aortogram and BLE arteriogram 3/25.   PT Comments    Pt seen s/p R 1st ray amputation, now with R foot NWB precautions. Pt currently unable to maintain these precautions with standing and ambulation due to apparent cognitive impairments; currently requires maxA to hold foot off of ground despite RW use. Pt pleasant with flat affect, agreeable to participate. Continue to recommend SNF-level therapies to maximize functional mobility and independence.    Follow Up Recommendations  SNF;Supervision for mobility/OOB     Equipment Recommendations  Rolling walker with 5" wheels    Recommendations for Other Services       Precautions / Restrictions Precautions Precautions: Fall;Other (comment) Precaution Comments: R foot wound vac Restrictions Weight Bearing Restrictions: Yes RLE Weight Bearing: Non weight bearing    Mobility  Bed Mobility Overal bed mobility: Needs Assistance Bed Mobility: Supine to Sit     Supine to sit: Mod assist;HOB elevated     General bed mobility comments: Pt able to initiate BLE movement to EOB with cues for sequencing; modA for UE support to assist trunk elevation  Transfers Overall transfer level: Needs assistance Equipment used: Rolling walker (2 wheeled) Transfers: Sit to/from Stand Sit to Stand: Mod assist;From elevated surface         General transfer comment: Repeated cues for correct hand  placement, eventually hand-over-hand assist to place R hand on bed to push. Pt unable to maintain R foot NWB despite max, multimodal cues  Ambulation/Gait Ambulation/Gait assistance: Min assist Gait Distance (Feet): 1 Feet Assistive device: Rolling walker (2 wheeled) Gait Pattern/deviations: Step-to pattern Gait velocity: Decreased   General Gait Details: Took pivotal steps from bed to recliner; will require maxA to keep R foot from floor for further gait training. Max cues throughout for sequencing and safety with RW   Stairs             Wheelchair Mobility    Modified Rankin (Stroke Patients Only)       Balance Overall balance assessment: Needs assistance Sitting-balance support: No upper extremity supported;Feet supported Sitting balance-Leahy Scale: Good     Standing balance support: No upper extremity supported;During functional activity Standing balance-Leahy Scale: Poor Standing balance comment: Reliant on UE support                            Cognition Arousal/Alertness: Awake/alert Behavior During Therapy: Flat affect Overall Cognitive Status: No family/caregiver present to determine baseline cognitive functioning Area of Impairment: Attention;Following commands;Safety/judgement;Awareness;Problem solving                   Current Attention Level: Sustained Memory: Decreased recall of precautions Following Commands: Follows one step commands with increased time;Follows one step commands inconsistently Safety/Judgement: Decreased awareness of deficits Awareness: Intellectual Problem Solving: Slow processing;Decreased initiation;Difficulty sequencing;Requires verbal cues;Requires tactile cues General Comments: Per chart, brother reports pt with baseline cognitive deficits. Slow to respond to questions (although seem mostly appropriate yes/no  answers); follows simple, one-step commands ~75% of session. Easily distracted, attempting to pull at  lines/leads but able to be redirected with frequent cues. Unable to follow R foot NWB despite max, multimodal cues      Exercises      General Comments        Pertinent Vitals/Pain Pain Assessment: No/denies pain    Home Living                      Prior Function            PT Goals (current goals can now be found in the care plan section) Acute Rehab PT Goals Patient Stated Goal: not stated PT Goal Formulation: With patient Time For Goal Achievement: 03/14/19 Potential to Achieve Goals: Good Progress towards PT goals: Progressing toward goals    Frequency    Min 2X/week      PT Plan Current plan remains appropriate    Co-evaluation              AM-PAC PT "6 Clicks" Mobility   Outcome Measure  Help needed turning from your back to your side while in a flat bed without using bedrails?: A Little Help needed moving from lying on your back to sitting on the side of a flat bed without using bedrails?: A Lot Help needed moving to and from a bed to a chair (including a wheelchair)?: A Little Help needed standing up from a chair using your arms (e.g., wheelchair or bedside chair)?: A Little Help needed to walk in hospital room?: A Lot Help needed climbing 3-5 steps with a railing? : Total 6 Click Score: 14    End of Session Equipment Utilized During Treatment: Gait belt Activity Tolerance: Patient tolerated treatment well Patient left: in chair;with call bell/phone within reach;with chair alarm set;with nursing/sitter in room Nurse Communication: Mobility status PT Visit Diagnosis: Other abnormalities of gait and mobility (R26.89);Difficulty in walking, not elsewhere classified (R26.2)     Time: 1517-6160 PT Time Calculation (min) (ACUTE ONLY): 21 min  Charges:  $Therapeutic Activity: 8-22 mins                    Ina Homes, PT, DPT Acute Rehabilitation Services  Pager [phone] Office [phone]  Malachy Chamber 02/19/2019, 2:55  PM

## 2019-03-03 NOTE — Progress Notes (Signed)
Plan for left TCAR (carotid stent) on Monday for symptomatic left high grade carotid stenosis.  Will need right iliac thrombectomy, right iliac stent, right fem AK pop bypass after carotid procedure next week.  High risk for limb loss, discussed with brother.  Would start heparin gtt if no contraindication.  Cephus Shelling, MD [facility] Office: [phone] Pager: [phone]  Cephus Shelling

## 2019-03-03 NOTE — TOC Initial Note (Addendum)
Transition of Care [facility]) - Initial/Assessment Note    Patient Details  Name: Noah Thomas MRN: [id number] Date of Birth: 1961/10/02  Transition [facility]) CM/SW Contact:    Eduard Roux, LCSWA Phone Number: 02/08/2019, 5:14 PM  Clinical Narrative:                 CSW spoke with the patient's brother by phone. He states the patients home will probably be condemned because of the "unlivable" condition.Pateint lives alone but his brother has been guardian over his fiances( monies received after his father passed) to make sure his living expenses were paid. Patient's brother states he works two jobs and is unable to provide the care needed for his brother. CSW advised the patient's brother of PT recommendations for ST rehab at SNF before returning home. He is in agreement with the discharge plan. CSW explained the SNF placement process, and possible barries(LOG, etc..). CSW encourage the patient's brother to begin thinking about what will be the plan after his brother is discharged from SNF, what will be the long term plan/goal.  [facility] financial counselor started [place] application today.   CSW completed FL2 and faxed to [facility] SNFs to review.   Expected Discharge Plan: Skilled Nursing Facility Barriers to Discharge: Continued Medical Work up   Patient Goals and CMS Choice        Expected Discharge Plan and Services Expected Discharge Plan: Skilled Nursing Facility In-house Referral: Clinical Social Work Discharge Planning Services: NA   Living arrangements for the past 2 months: Mobile Home                 DME Arranged: N/A DME Agency: NA HH Arranged: NA HH Agency: NA  Prior Living Arrangements/Services Living arrangements for the past 2 months: Mobile Home Lives with:: Self Patient language and need for interpreter reviewed:: No Do you feel safe going back to the place where you live?: No(family wants Short term rehab (SNF))      Need for Family Participation in  Patient Care: Yes (Comment) Care giver support system in place?: Yes (comment)   Criminal Activity/Legal Involvement Pertinent to Current Situation/Hospitalization: No - Comment as needed  Activities of Daily Living Home Assistive Devices/Equipment: None ADL Screening (condition at time of admission) Patient's cognitive ability adequate to safely complete daily activities?: Yes Is the patient deaf or have difficulty hearing?: No Does the patient have difficulty seeing, even when wearing glasses/contacts?: No Does the patient have difficulty concentrating, remembering, or making decisions?: Yes Patient able to express need for assistance with ADLs?: Yes Does the patient have difficulty dressing or bathing?: No Independently performs ADLs?: Yes (appropriate for developmental age) Does the patient have difficulty walking or climbing stairs?: Yes Weakness of Legs: Right Weakness of Arms/Hands: Both  Permission Sought/Granted Permission sought to share information with : Family Supports, Magazine features editor, Case Estate manager/land agent granted to share information with : Yes, Verbal Permission Granted  Share Information with NAME: Khamir Applin   Permission granted to share info w AGENCY: SNFs   Permission granted to share info w Relationship: brother   Permission granted to share info w Contact Information: [phone]  Emotional Assessment Appearance:: Appears stated age(pateint was sleep ) Attitude/Demeanor/Rapport: Unable to Assess Affect (typically observed): Unable to Assess Orientation: : Oriented to Self, Oriented to Place, Oriented to  Time, Oriented to Situation Alcohol / Substance Use: Not Applicable Psych Involvement: Yes (comment)(psyc eval was completed)  Admission diagnosis:  Somnolence [R40.0] Open nondisplaced fracture  of distal phalanx of right great toe, initial encounter [S92.424B] Traumatic rhabdomyolysis, initial encounter (HCC) [T79.6XXA] Sepsis, due to  unspecified organism, unspecified whether acute organ dysfunction present [facility]) [A41.9] Patient Active Problem List   Diagnosis Date Noted  . Paranoia (HCC)   . Chronic mental illness 03/09/2019  . Right great toe amputee (HCC)   . Cerebral thrombosis with cerebral infarction 03/01/2019  . New onset type 2 diabetes mellitus (HCC) 03/01/2019  . Internal carotid artery stenosis 03/01/2019  . Acute lacunar stroke (HCC) 03/01/2019  . Abnormal LFTs   . Osteomyelitis of great toe of right foot (HCC)   . Gangrene of toe of right foot (HCC)   . Moderate protein-calorie malnutrition (HCC)   . Smoking   . Acute metabolic encephalopathy 02/15/2019  . Dehydration 02/13/2019  . Cellulitis 02/23/2019  . Adult failure to thrive 02/13/2019   PCP:  Patient, No Pcp Per Pharmacy:   [id number] - , [place] - [address] AT [facility] OF [address] [place] [address] Phone: [phone] Fax: [phone]     Social Determinants of Health (SDOH) Interventions    Readmission Risk Interventions No flowsheet data found.

## 2019-03-03 NOTE — Progress Notes (Signed)
  Echocardiogram 2D Echocardiogram has been performed.  A limited study was performed to include a bubble study, this limitation is to reduce patient to technician time.  Noah Thomas 03/08/2019, 12:39 PM

## 2019-03-03 NOTE — Progress Notes (Signed)
[facility]  Subjective  - LE angio today.   Objective (!) 167/91 94 98.6 F (37 C) (Oral) 20 96%  Intake/Output Summary (Last 24 hours) at 02/22/2019 0951 Last data filed at 02/12/2019 0900 Gross per 24 hour  Intake 1878.13 ml  Output 1000 ml  Net 878.13 ml   2+ right femoral pulse palpable No palpable right pedal pulses  Laboratory Lab Results: Recent Labs    02/22/2019 0337 02/07/2019 0316  WBC 12.9* 12.6*  HGB 16.9 13.8  HCT 48.2 41.4  PLT 219 253   BMET Recent Labs    02/17/2019 0337 02/27/2019 0316  NA 136 134*  K 3.8 3.2*  CL 103 103  CO2 23 23  GLUCOSE 143* 156*  BUN 13 14  CREATININE 0.89 1.05  CALCIUM 8.8* 8.1*    COAG No results found for: INR, PROTIME No results found for: PTT  Assessment/Planning:  Aortogram/RLE arteriogram today to evaluate CLI with tissue loss.  S/P right great toe amputation by Dr. Lajoyce Corners.  Will need to decide on left carotid intervention after lower extremity angio today given symptomatic left ICA high grade stenosis.  Cephus Shelling 02/26/2019 9:51 AM --

## 2019-03-03 NOTE — Consult Note (Signed)
Consultation Note Date: 02/17/2019   Patient Name: Noah Thomas  DOB: 1961-08-24  MRN: [id number]  Age / Sex: 57 y.o., male  PCP: Patient, No Pcp Per Referring Physician: Cathren Harsh, MD  Reason for Consultation: Establishing goals of care and Psychosocial/spiritual support  HPI/Patient Profile: 58 y.o. male  with past medical history of mental health disorder and minimal medical care in the past, who was admitted on 03-29-2019 with sepsis and acute CVA.  He was found to have osteomyelitis of his great right toe and underwent amputation.  Subsequent imaging studies have showed significant cerebrovascular disease as well as peripheral Noah Thomas disease.  His brain imaging also showed wallerian degeneration consistent with dementia.  We were asked to consult for goals of care.  Clinical Assessment and Goals of Care:  I have reviewed medical records including EPIC notes, labs and imaging, received report from Dr. Isidoro Thomas, assessed the patient and then spoke on the phone with his brother Noah Thomas to discuss diagnosis prognosis, GOC, EOL wishes, disposition and options.  I introduced Palliative Medicine as specialized medical care for people living with serious illness. It focuses on providing relief from the symptoms and stress of a serious illness. The goal is to improve quality of life for both the patient and the family.  We discussed a brief life review of the patient.  Per Noah Thomas had his first bout of serious mental illness in his early teens.  After that time their parents supported Noah Thomas and attempted to keep him safe until they passed away.  Now his only living relative is his brother Noah Thomas.  Noah Thomas supports Noah Thomas financially however, Noah Thomas describes Noah Thomas is being so disruptive he is unable to live with him.  As far as functional and nutritional status Noah Thomas was living at home alone in a trailer.  Noah Thomas  describes Noah Thomas's trailer as having holes in the floor and being covered with filth.  Reportedly the trailer is being condemned.  Noah Thomas has always eaten well and he was able to walk on his birthday.  As his brother Noah Thomas cooked a birthday dinner for him and invited him to his home.  Noah Thomas was still driving prior to admission.  Noah Thomas expressed concern over this as he believes Noah Thomas has significant visual deficits.  We discussed his current illness and what it means in the larger context of his on-going co-morbidities.  Natural disease trajectory and expectations at EOL were discussed.  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  I attempted to elicit values and goals of care important to the patient.  Noah Thomas expressed that Noah Thomas avoided medical care at all costs in the past.  He would not want to be in the hospital.  He would not want to be undergoing procedures.  Noah Thomas feels that Noah Thomas is very angry at him for being in the hospital at this point.  The difference between aggressive medical intervention and comfort care was considered  in light of the patient's goals of care.  Advanced directives, concepts specific to code status, artifical feeding and hydration, and rehospitalization were considered and discussed.  Noah Thomas felt strongly that Noah Thomas should not be a full code but rather he should be DNR.    Hospice and Palliative Care services outpatient were explained and offered.  Noah Thomas would like for his brother to receive palliative services outpatient.  If Noah Thomas is eligible for hospice services after rehab gym would want his brother to have hospice support.  He does not want his brother to undergo invasive aggressive surgery or treatment.  Rather he wants to respect his brother's wishes allowing his body to do what is natural and yet remain comfortable.  Questions and concerns were  addressed.   The family was encouraged to call with questions or concerns.  PMT will follow up with the medical team, patient and family on 3/26 in order to make more focused decisions around medical care and possible surgical interventions..    Primary Decision Maker:  NEXT OF KIN brother Noah Thomas.  Patient has been deemed not to have capacity by Psychiatry.    SUMMARY OF RECOMMENDATIONS     Code status changed to DNR  PMT would like to converse further with Noah Thomas and Noah Thomas to better understand Noah Thomas prognosis with and without invasive procedures.  Brother Noah Thomas is open to the patient receiving Hospice support if he is eligible.  Noah Thomas feels his brother likely has a significant visual impairment and requested that he be given a basic eye chart test.  He was concerned about the patient driving.  Code Status/Advance Care Planning:  DNR   Symptom Management:   Patient appears comfortable.  Palliative Prophylaxis:   Frequent Pain Assessment  Psycho-social/Spiritual:   Desire for further Chaplaincy support: not at this time.  Prognosis:  TBD    Discharge Planning: Skilled Nursing Facility for rehab with Palliative care service follow-up      Primary Diagnoses: Present on Admission: . Acute metabolic encephalopathy . (Resolved) Toe fracture, right 1st with cellulitis and?  Gangrene versus hematoma. . (Resolved) Rhabdomyolysis . Dehydration . Cellulitis . (Resolved) Sepsis (Noah Thomas) . Adult failure to thrive . Osteomyelitis of great toe of right foot (Noah Thomas) . Gangrene of toe of right foot (Noah Thomas) . Smoking . (Resolved) Somnolence . Internal carotid artery stenosis . Acute lacunar stroke (Noah Thomas) . Abnormal LFTs . (Resolved) Open nondisplaced fracture of distal phalanx of right great toe   I have reviewed the medical record, interviewed the patient and family, and examined the patient. The following aspects are pertinent.  Past Medical History:  Diagnosis Date  .  Osteomyelitis (Noah Thomas) 10-Mar-2019   RIGHT GREAT TOE   Social History   Socioeconomic History  . Marital status: Single    Spouse name: Not on file  . Number of children: Not on file  . Years of education: Not on file  . Highest education level: Not on file  Occupational History  . Not on file  Social Needs  . Financial resource strain: Not on file  . Food insecurity:    Worry: Not on file    Inability: Not on file  . Transportation needs:    Medical: Not on file    Non-medical: Not on file  Tobacco Use  . Smoking status: Current Every Day Smoker  . Smokeless tobacco: Never Used  Substance and Sexual Activity  . Alcohol use: Yes    Comment: Not much as per brother.  Marland Kitchen  Drug use: Not Currently  . Sexual activity: Not Currently  Lifestyle  . Physical activity:    Days per week: Patient refused    Minutes per session: Patient refused  . Stress: Not on file  Relationships  . Social connections:    Talks on phone: Patient refused    Gets together: Patient refused    Attends religious service: Patient refused    Active member of club or organization: Patient refused    Attends meetings of clubs or organizations: Patient refused    Relationship status: Patient refused  Other Topics Concern  . Not on file  Social History Narrative  . Not on file   History reviewed. No pertinent family history. Scheduled Meds: . aspirin EC  81 mg Oral Daily  . docusate sodium  100 mg Oral BID  . folic acid  1 mg Oral Daily  . [START ON 03/04/2019] Influenza vac split quadrivalent PF  0.5 mL Intramuscular Tomorrow-1000  . insulin aspart  0-9 Units Subcutaneous TID WC  . multivitamin with minerals  1 tablet Oral Daily  . nicotine  21 mg Transdermal Daily  . pentoxifylline  400 mg Oral TID WC  . [START ON 03/04/2019] pneumococcal 23 valent vaccine  0.5 mL Intramuscular Tomorrow-1000  . sodium chloride flush  3 mL Intravenous Q12H  . sodium chloride flush  3 mL Intravenous Q12H  . thiamine  100  mg Oral Daily   Continuous Infusions: . sodium chloride 50 mL/hr at 02/07/2019 0300  . sodium chloride    . methocarbamol (ROBAXIN) IV     PRN Meds:.sodium chloride, acetaminophen **OR** acetaminophen, bisacodyl, hydrALAZINE, HYDROmorphone (DILAUDID) injection, labetalol, magnesium citrate, methocarbamol **OR** methocarbamol (ROBAXIN) IV, ondansetron **OR** ondansetron (ZOFRAN) IV, oxyCODONE, oxyCODONE, polyethylene glycol, senna-docusate, sodium chloride flush, sodium chloride flush No Known Allergies Review of Systems Patient denies pain and will only answer a few questions with yes or no.  Physical Exam  Well developed obese male, lying in bed in NAD.  Responds to some of my questions with a minimal nod or shake of the head.  He does not open his eyes to look at me.  Vital Signs: BP 130/73 (BP Location: Left Arm)   Pulse 85   Temp 99.6 F (37.6 C) (Oral)   Resp 18   Ht 6' (1.829 m)   Wt 109 kg Comment: weighed twice/equipment off bed  SpO2 93%   BMI 32.59 kg/m  Pain Scale: 0-10 POSS *See Group Information*: 1-Acceptable,Awake and alert Pain Score: 6    SpO2: SpO2: 93 % O2 Device:SpO2: 93 % O2 Flow Rate: .O2 Flow Rate (L/min): 2 L/min  IO: Intake/output summary:   Intake/Output Summary (Last 24 hours) at 02/26/2019 1949 Last data filed at 03/09/2019 1748 Gross per 24 hour  Intake 2365.28 ml  Output 1640 ml  Net 725.28 ml    LBM: Last BM Date: (pta) Baseline Weight: Weight: 81.6 kg Most recent weight: Weight: 109 kg(weighed twice/equipment off bed)     Palliative Assessment/Data: 30%     Time In: 5:00 Time Out: 6:30 Time Total: 90 min. Greater than 50%  of this time was spent counseling and coordinating care related to the above assessment and plan.  Signed by: Norvel Richards, PA-C Palliative Medicine Pager: [phone]  Please contact Palliative Medicine Team phone at [phone] for questions and concerns.  For individual provider: See Loretha Stapler

## 2019-03-03 NOTE — Progress Notes (Signed)
Pt received from cath lab. Pt has wound vac to R toe, zero drainage presently. Prevalon boot to L. Pt c/aox4. Pt denies any complaints. CHG bath given. CCMD notified. BS 129. Pt oriented to call bell and room.  Lacy Duverney, RN

## 2019-03-03 NOTE — Addendum Note (Signed)
Addendum  created 02/10/2019 2057 by [name], MD   Clinical Note Signed, Intraprocedure Event edited, Intraprocedure Staff edited, SmartForm saved

## 2019-03-03 NOTE — Op Note (Signed)
Patient name: Noah Thomas MRN: [id number] DOB: [birth year] Sex: male  02/24/2019 Pre-operative Diagnosis: Critical limb ischemia of the right lower extremity with tissue loss Post-operative diagnosis:  Same Surgeon:  Cephus Shelling, MD Procedure Performed: 1.  Ultrasound-guided access of the left common femoral artery 2.  Aortogram 3.  Bilateral lower extremity arteriogram with runoff 4.  Mynx closure of the left common femoral artery  Indications: Patient is a 58 year old male who was found down at home with rhabdomyolysis.  He has multiple complicating features including bilateral high-grade carotid stenosis with concern for left symptomatic high grade carotid stenosis given recent evidence of acute/subacute infarct on MRI.  In addition he was found to have a gangrenous right great toe and he presents today for right lower extremity arteriogram after undergoing toe amputation by Dr. Lajoyce Corners with orthopedic surgery earlier this week.  Risk and benefits have been discussed with his brother.  Findings: Aortogram showed patent infrarenal aorta and single renal arteries bilaterally that were patent.  The right common and external iliac artery appeared to have acute/subacute thrombus with flow past this lesion and retrograde filling of the right hypogastric.  The right common femoral and profunda were patent.  Patient had a long flush SFA occlusion with reconstitution of the above-knee popliteal artery via collaterals off the profunda.  Patient had a patent below-knee popliteal artery with three-vessel runoff.  Dominant inflow into the right foot is via the posterior tibial artery with filling of the plantar arch.  The anterior tibial artery appears to occlude just above the ankle and this may be related to atheroembolic event.  The peroneal is patent.  The left iliac, common femoral, profunda, SFA, popliteal, and tibial arteries are all patent with no flow limiting stenosis.   Procedure:  The  patient was identified in the holding area and taken to room 8.  The patient was then placed supine on the table and prepped and draped in the usual sterile fashion.  A time out was called.  Ultrasound was used to evaluate the left common femoral artery.  It was patent .  A digital ultrasound image was acquired.  A micropuncture needle was used to access the left common femoral artery under ultrasound guidance.  An 018 wire was advanced without resistance and a micropuncture sheath was placed.  The 018 wire was removed and a benson wire was placed.  The micropuncture sheath was exchanged for a 5 french sheath.  An omniflush catheter was advanced over the wire to the level of L-1.  An abdominal angiogram was obtained.  Pertinent findings are noted above.  We then pulled down the Omni Flush catheter to just above the aortic bifurcation and bilateral lower extremity runoff was obtained.  Ultimately we had a long discussion with several of my partners including Dr. Arbie Cookey and Dr. Darrick Penna regarding best steps moving forward.  From an operative standpoint patient would probably benefit from a right iliac thrombectomy in the OR with a right femoral to above-knee popliteal bypass and then stenting of a right common iliac lesion that is likely the source of in situ thrombosis.  He is not a lysis candidate given acute stroke on MRI.  This is complicated by the fact that he has likely symptomatic high-grade left carotid stenosis with recent evidence of infarct on MRI.  Ultimately we decided that we will proceed with carotid intervention first likely on Monday and then he will need right leg intervention later in the week.  At  this time he does have femoral pulse in the right groin and there is flow past this fresh thrombus in the iliac arteries and there is flow down the profunda with collaterals filling the tibials.  He will certainly be at high risk for limb loss in the interim.  At that point in time the wires and catheters  were removed from the left groin sheath.  A 5 French mynx closure device was deployed in the left common femoral artery and additional pressure was held for 5 minutes.  Plan: We had a long discussion with several my partners including Dr. Arbie Cookey and Dr. Darrick Penna regarding about best steps moving forward.  From an operative standpoint patient would probably benefit from a right iliac thrombectomy in the OR with a right femoral to above-knee popliteal bypass and then stenting of a right common iliac lesion that likely was the source of in situ thrombosis.  This is complicated by the fact that he has likely symptomatic high-grade left carotid stenosis with recent evidence of infarct on MRI.  Will proceed with carotid intervention first.  Cephus Shelling, MD [facility] [place] [facility] Office: [phone] Pager: [phone]  Cephus Shelling

## 2019-03-03 NOTE — Progress Notes (Signed)
[facility]                                                                              Patient Demographics  Noah Thomas, is a 58 y.o. male, DOB - 09/29/1961, QMV:[id number]  Admit date - 2019/03/01   Admitting Physician Elease Etienne, MD  Outpatient Primary MD for the patient is Patient, No Pcp Per  Outpatient specialists:   LOS - 4  days   Medical records reviewed and are as summarized below:    Chief Complaint  Patient presents with   Altered Mental Status       Brief summary   :58 year old male, lives alone in a mobile home and independent, has not seen a physician in a long time, PMH of significant longstanding mental health issues, not on prescription medications, tobacco abuse, presented to [facility] ED on March 01, 2019 via EMS with altered mental status. History obtained from brother. Brother indicated that patient was normal until his teenage years when he started becoming paranoid and agitated while living with his parents, eventually had to be placed in [facility]. Over the years patient has calmed down, able to go to the grocery store for food, cigarettes but has no insight and unable to truly care for himself. He lives in [place] conditions to an extent that when EMS went to retrieve him today, authorities were planning to "condemn" his trailer home. Patient's brother has given him a debit card and provides with the finances. Patient last visited his brother on [birth year] his birthday, at which point he was in his usual state of health except he complained of some toe pain but was managing to ambulate. Since then the brother was trying to reach him by phone for several days and went over to his house a day before the admission but did not get an answer at the door. EMS found the patient on the ground in filthy condition, confused. Patient is unable to provide any history.   ED Course:Noted to be in filthy condition, tachycardic, not febrile, not  hypotensive, leukocytosis, elevated lactate, right toe exam suggestive of possible cellulitis, initiated sepsis protocol and received IV fluids and broad-spectrum IV antibiotics. Orthopedics consulted by EDP and will see him in the morning. WBC 14.6, hemoglobin 18.4, glucose 211, BUN 33, AST 136, ALT 69, total bilirubin 1.5, CK 3000 323, troponin 0 0.03, urine microscopy not suggestive of UTI, chest x-ray negative, CT head suggests subacute to chronic ischemia, right foot x-ray showed comminuted fracture of the first distal phalanx.  Assessment & Plan    Principal Problem:   Right great toe gangrene/ abscess:  - patient was found to have comminuted fracture of the right first distal phalanx extending to the articular surface -Patient was placed on IV vancomycin and cefepime, stopped 24 hours postop per Ortho recommendations.   -Per Dr. Lajoyce Corners,  wound [facility] for 1 week, NWB on R foot -Vascular surgery was consulted, undergoing angiogram today  Acute CVA/ bilat ICA stenosis: involving the left internal capsule and basal ganglia.  -CT head showed chronic atrophy, prior R MCA infarction with encephalomalacia, subacute to chronic left internal capsule/BG infarct -MRI  of the brain showed acute/subacute infarct left internal capsule/BG.  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 Raynald Kemp, recommended aspirin 81 mg and Plavix 75 mg daily for 3 weeks and then aspirin alone.  Plavix held for OR today. -PT recommended skilled nursing facility.  Patient lives in a mobile home, currently condemned. Vascular surgery following for carotid intervention.  Acute metabolic encephalopathy -Secondary to CVA, acute infection, dehydration, may have underlying psych illness -Per patient's brother, reportedly he had a mental illness since a teenager, was in but not at one point, as an  adult never worked more than a week and lives alone.  Patient has been dependent on his family all his adult life for food enlarging. -Psychiatry and palliative care consulted  Diabetes mellitus type 2 -New diagnosis, hemoglobin A1c 7.7.  Patient had not been following with doctors. -Patient was seen by diabetic coronary care, recommended metformin at the time of discharge. -For now continue sliding scale insulin   Transaminitis -Improving, right upper quadrant ultrasound showed fatty liver  Generalized weakness, acute CVA PT OT consulted, recommended skilled nursing facility  Rhabdomyolysis, acute, mild -Resolved with IV fluids   Code Status: Full CODE STATUS DVT Prophylaxis: SCD's Family Communication: Discussed in detail with the patient, all imaging results, lab results explained to the patient    Disposition Plan: Skilled nursing facility when bed available, likely in a.m.  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 03/01/19 1706 02/07/2019 0805   02/28/19 2100  vancomycin (VANCOCIN) 1,000 mg in sodium chloride 0.9 % 250 mL IVPB     1,000 mg 250 mL/hr over 60 Minutes Intravenous Every 12 hours 02/28/19 1707 02/24/2019 1159   02/28/19 0600  vancomycin (VANCOCIN) IVPB 1000 mg/200 mL premix  Status:  Discontinued      1,000 mg 200 mL/hr over 60 Minutes Intravenous Every 12 hours February 28, 2019 1708 02/28/19 1707   02/28/19 0600  ceFEPIme (MAXIPIME) 2 g in sodium chloride 0.9 % 100 mL IVPB     2 g 200 mL/hr over 30 Minutes Intravenous Every 12 hours 28-Feb-2019 1708 03/07/2019 0959   2019/02/28 1700  vancomycin (VANCOCIN) 1,500 mg in sodium chloride 0.9 % 500 mL IVPB     1,500 mg 250 mL/hr over 120 Minutes Intravenous  Once 02/28/19 1613 2019/02/28 2250   28-Feb-2019 1615  ceFEPIme (MAXIPIME) 2 g in sodium chloride 0.9 % 100 mL IVPB     2 g 200 mL/hr over 30 Minutes Intravenous  Once February 28, 2019 1610 28-Feb-2019 1715   February 28, 2019 1615  metroNIDAZOLE (FLAGYL) IVPB 500 mg     500 mg 100 mL/hr over 60 Minutes Intravenous  Once February 28, 2019 1610 2019-02-28 1845   02/28/2019  1615  vancomycin (VANCOCIN) IVPB 1000 mg/200 mL premix  Status:  Discontinued     1,000 mg 200 mL/hr over 60 Minutes Intravenous  Once 02/20/2019 1610 03/02/2019 1613          Medications  Scheduled Meds:  aspirin EC  81 mg Oral Daily   docusate sodium  100 mg Oral BID   folic acid  1 mg Oral Daily   [START ON 03/04/2019] Influenza vac split quadrivalent PF  0.5 mL Intramuscular Tomorrow-1000   insulin aspart  0-9 Units Subcutaneous TID WC   multivitamin with minerals  1 tablet Oral Daily   nicotine  21 mg Transdermal Daily   pentoxifylline  400 mg Oral TID WC   [START ON 03/04/2019] pneumococcal 23 valent vaccine  0.5 mL Intramuscular Tomorrow-1000   sodium chloride flush  3 mL Intravenous Q12H   sodium chloride flush  3 mL Intravenous Q12H   thiamine  100 mg Oral Daily   Continuous Infusions:  sodium chloride 50 mL/hr at 02/26/2019 0300   sodium chloride     sodium chloride     methocarbamol (ROBAXIN) IV     vancomycin Stopped (03/06/2019 0804)   PRN Meds:.sodium chloride, acetaminophen **OR** acetaminophen, bisacodyl, hydrALAZINE, HYDROmorphone (DILAUDID) injection, labetalol, magnesium citrate, methocarbamol **OR** methocarbamol (ROBAXIN)  IV, ondansetron **OR** ondansetron (ZOFRAN) IV, oxyCODONE, oxyCODONE, polyethylene glycol, senna-docusate, sodium chloride flush, sodium chloride flush      Subjective:   Caedan Sumler was seen and examined today.  Patient denies dizziness, chest pain, shortness of breath, abdominal pain, N/V/D/C, new weakness, numbess, tingling. No acute events overnight.    Objective:   Vitals:   02/13/2019 1053 03/05/2019 1058 02/18/2019 1102 02/11/2019 1103  BP: (!) 174/100  (!) 153/93   Pulse: 92 85 82 80  Resp: (!) 0 (!) 7 (!) 22 14  Temp:      TempSrc:      SpO2: 98% (!) 0% (!) 0% (!) 0%  Weight:      Height:        Intake/Output Summary (Last 24 hours) at 03/05/2019 1129 Last data filed at 02/24/2019 0900 Gross per 24 hour  Intake 1755.13 ml  Output 1000 ml  Net 755.13 ml     Wt Readings from Last 3 Encounters:  02/22/2019 109 kg     Exam  General: Alert and oriented x self and place, NAD, mild dysarthria  Eyes:   HEENT:  Atraumatic, normocephalic, normal oropharynx  Cardiovascular: S1 S2 auscultated, no rubs, murmurs or gallops. Regular rate and rhythm.  Respiratory: Clear to auscultation bilaterally, no wheezing, rales or rhonchi  Gastrointestinal: Soft, nontender, nondistended, + bowel sounds  Ext: no pedal edema bilaterally  Neuro: Strength 5/5 upper and lower extremities bilaterally  Musculoskeletal: Right lower extremity dressing intact  Skin: No rashes  Psych: Flat affect   Data Reviewed:  I have personally reviewed following labs and imaging studies  Micro Results Recent Results (from the past 240 hour(s))  Urine culture     Status: None   Collection Time: 02/28/2019  4:04 PM  Result Value Ref Range Status   Specimen Description URINE, CATHETERIZED  Final   Special Requests Normal  Final   Culture   Final    NO GROWTH Performed at [facility], [address], [place], [place] [address]    Report Status 02/28/2019 FINAL  Final  Surgical pcr screen      Status: None   Collection Time: 03/01/19  5:15 PM  Result  Value Ref Range Status   MRSA, PCR NEGATIVE NEGATIVE Final   Staphylococcus aureus NEGATIVE NEGATIVE Final    Comment: (NOTE) The Xpert SA Assay (FDA approved for NASAL specimens in patients 39 years of age and older), is one component of a comprehensive surveillance program. It is not intended to diagnose infection nor to guide or monitor treatment. Performed at [facility], [address], [place], [place] [address]     Radiology Reports Ct Angio Head W Or Wo Contrast  Result Date: 02/28/2019 CLINICAL DATA:  Stroke follow-up. 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 EXAM: CT ANGIOGRAPHY HEAD AND NECK TECHNIQUE: Multidetector CT imaging of the head and neck was performed using the standard protocol during bolus administration of intravenous contrast. Multiplanar CT image reconstructions and MIPs were obtained to evaluate the vascular anatomy. Carotid stenosis measurements (when applicable) are obtained utilizing NASCET criteria, using the distal internal carotid diameter as the denominator. CONTRAST:  75mL ISOVUE-370 IOPAMIDOL (ISOVUE-370) INJECTION 76% COMPARISON:  MRI of the brain 02/09/2019 FINDINGS: CT HEAD FINDINGS Brain: The left internal capsule infarct is again noted, now slightly lower density than on the previous CT. No new infarct is present. The remote right MCA territory encephalomalacia is stable. The ventricles are of proportionate to the degree of atrophy. No significant extraaxial fluid collection is present. The brainstem and cerebellum are within normal limits. Vascular: Atherosclerotic calcifications are present within the cavernous internal carotid arteries bilaterally. There is no hyperdense vessel. Skull: The craniocervical junction is normal. Upper cervical spine is within normal limits. Marrow signal is unremarkable. Sinuses: Scattered opacification of left  ethmoid air cells are present. There are no fluid levels. The paranasal sinuses and mastoid air cells are otherwise clear. Orbits: The globes and orbits are within normal limits. Review of the MIP images confirms the above findings CTA NECK FINDINGS Aortic arch: A 3 vessel arch configuration is present. Minimal atherosclerotic changes are present at the great vessel origins. There is no significant stenosis or aneurysm. Right carotid system: The right common carotid artery is within normal limits. Dense calcifications are present at the right carotid bifurcation. There is a high-grade stenosis at the carotid bifurcation. Lumen is narrowed to less than 1 mm. The more distal right common carotid artery is within normal limits to the skull base. Left carotid system: The left common carotid artery demonstrates some atherosclerotic irregularity. There is a high-grade, near occlusive stenosis of the left internal carotid artery at its bifurcation. The cervical left ICA is otherwise normal. Vertebral arteries: Extensive atherosclerotic calcifications are present along the vertebral arteries bilaterally. The right vertebral artery is dominant. There is a high-grade stenosis at the proximal left vertebral artery. No significant stenosis is present in the right vertebral artery. The left vertebral artery is reconstituted at the distal V1 segment. Extensive atherosclerotic changes are present throughout the V2 segment. These are high-grade stenosis at the level of C1. Skeleton: Vertebral body heights alignment are maintained. No focal lytic or blastic lesions are present. Other neck: The soft tissues the neck are otherwise unremarkable. No focal mucosal or submucosal lesions are present. Salivary glands are within normal limits. No significant adenopathy is present. Thyroid is normal. Upper chest: Next mild dependent atelectasis is present. The lung apices are otherwise clear. Thoracic inlet is within normal limits. Review of  the MIP images confirms the above findings CTA HEAD FINDINGS Anterior circulation: Atherosclerotic calcifications are present within the cavernous internal carotid arteries bilaterally without a significant  stenosis through the ICA termini. The left A1 is hypoplastic. The right A1 is normal. The anterior communicating artery is patent. ACA branch vessels are within normal limits bilaterally. There is a high-grade stenosis of the anterior right M2 segment with marked attenuation of distal branches. Diffuse irregularity present and more posterior left MCA branches. There is moderate irregularity in left MCA branches. Pial collaterals are evident. Posterior circulation: The right vertebral artery is the dominant vessel. Segmental irregularity is present in the left V4 segment without a significant stenosis. PICA origins are visualized and normal. The vertebrobasilar junction is normal. The basilar artery is normal. Both posterior cerebral arteries originate from the basilar tip. There is some irregularity of the proximal PCA vessels without significant proximal stenosis. Branch vessels are intact. Venous sinuses: The dural sinuses are patent. Anatomic variants: None Delayed phase: No pathologic enhancement is present. Infarcts are well-defined. Review of the MIP images confirms the above findings IMPRESSION: 1. High-grade bilateral proximal ICA stenoses at the carotid bifurcations. 2. 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 3. Hypoplastic left A1 segment. 4. High-grade stenosis of the anterior right M2 segment. 5. Moderate diffuse medium and distal small vessel disease in both the anterior and posterior circulations. 6. Expected evolution of left internal capsule nonhemorrhagic infarct. 7. 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 Electronically Signed   By: Marin Roberts M.D.   On: 02/28/2019 21:01   Dg Chest 2 View  Result  Date: 03/04/2019 CLINICAL DATA:  Altered mental status today. EXAM: CHEST - 2 VIEW COMPARISON:  Single-view of the chest 07/16/2006. FINDINGS: Lungs clear. Heart size normal. No pneumothorax or pleural fluid. No acute or focal bony abnormality. IMPRESSION: Negative chest. Electronically Signed   By: Drusilla Kanner M.D.   On: 03/05/2019 16:00   Dg Knee 2 Views Right  Result Date: 02/12/2019 CLINICAL DATA:  Knee pain, initial encounter EXAM: RIGHT KNEE - 2 VIEW COMPARISON:  None. FINDINGS: Mild medial joint space narrowing is noted. No acute fracture or dislocation is seen. Mild patellofemoral spurring is noted as well. IMPRESSION: Mild degenerative change without acute abnormality. Electronically Signed   By: Alcide Clever M.D.   On: 03/04/2019 16:03   Ct Head Wo Contrast  Result Date: 02/14/2019 CLINICAL DATA:  Altered level of consciousness EXAM: CT HEAD WITHOUT CONTRAST TECHNIQUE: Contiguous axial images were obtained from the base of the skull through the vertex without intravenous contrast. COMPARISON:  None. FINDINGS: Brain: Mild atrophic changes are noted. Encephalomalacia changes are seen in the distribution of the right middle cerebral artery consistent with prior infarct. Rounded decreased area of attenuation is noted in the region of the internal capsule on the left suggestive of subacute to chronic ischemia. No focal area of acute infarct or acute hemorrhage is seen. No space-occupying mass lesion is noted. Vascular: No hyperdense vessel or unexpected calcification. Skull: Normal. Negative for fracture or focal lesion. Sinuses/Orbits: No acute finding. Other: None. IMPRESSION: Chronic atrophic changes. 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 Rounded somewhat elongated area of decreased attenuation on the left in the region of the internal capsule and basal ganglia consistent with subacute to chronic ischemia. No acute infarct is noted. Electronically Signed   By: Alcide Clever  M.D.   On: 02/15/2019 16:06   Ct Angio Neck W Or Wo Contrast  Result Date: 02/28/2019 CLINICAL DATA:  Stroke follow-up. 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 EXAM:  CT ANGIOGRAPHY HEAD AND NECK TECHNIQUE: Multidetector CT imaging of the head and neck was performed using the standard protocol during bolus administration of intravenous contrast. Multiplanar CT image reconstructions and MIPs were obtained to evaluate the vascular anatomy. Carotid stenosis measurements (when applicable) are obtained utilizing NASCET criteria, using the distal internal carotid diameter as the denominator. CONTRAST:  75mL ISOVUE-370 IOPAMIDOL (ISOVUE-370) INJECTION 76% COMPARISON:  MRI of the brain 02/26/2019 FINDINGS: CT HEAD FINDINGS Brain: The left internal capsule infarct is again noted, now slightly lower density than on the previous CT. No new infarct is present. The remote right MCA territory encephalomalacia is stable. The ventricles are of proportionate to the degree of atrophy. No significant extraaxial fluid collection is present. The brainstem and cerebellum are within normal limits. Vascular: Atherosclerotic calcifications are present within the cavernous internal carotid arteries bilaterally. There is no hyperdense vessel. Skull: The craniocervical junction is normal. Upper cervical spine is within normal limits. Marrow signal is unremarkable. Sinuses: Scattered opacification of left ethmoid air cells are present. There are no fluid levels. The paranasal sinuses and mastoid air cells are otherwise clear. Orbits: The globes and orbits are within normal limits. Review of the MIP images confirms the above findings CTA NECK FINDINGS Aortic arch: A 3 vessel arch configuration is present. Minimal atherosclerotic changes are present at the great vessel origins. There is no significant stenosis or aneurysm. Right carotid system: The right common carotid artery is within normal  limits. Dense calcifications are present at the right carotid bifurcation. There is a high-grade stenosis at the carotid bifurcation. Lumen is narrowed to less than 1 mm. The more distal right common carotid artery is within normal limits to the skull base. Left carotid system: The left common carotid artery demonstrates some atherosclerotic irregularity. There is a high-grade, near occlusive stenosis of the left internal carotid artery at its bifurcation. The cervical left ICA is otherwise normal. Vertebral arteries: Extensive atherosclerotic calcifications are present along the vertebral arteries bilaterally. The right vertebral artery is dominant. There is a high-grade stenosis at the proximal left vertebral artery. No significant stenosis is present in the right vertebral artery. The left vertebral artery is reconstituted at the distal V1 segment. Extensive atherosclerotic changes are present throughout the V2 segment. These are high-grade stenosis at the level of C1. Skeleton: Vertebral body heights alignment are maintained. No focal lytic or blastic lesions are present. Other neck: The soft tissues the neck are otherwise unremarkable. No focal mucosal or submucosal lesions are present. Salivary glands are within normal limits. No significant adenopathy is present. Thyroid is normal. Upper chest: Next mild dependent atelectasis is present. The lung apices are otherwise clear. Thoracic inlet is within normal limits. Review of the MIP images confirms the above findings CTA HEAD FINDINGS Anterior circulation: Atherosclerotic calcifications are present within the cavernous internal carotid arteries bilaterally without a significant stenosis through the ICA termini. The left A1 is hypoplastic. The right A1 is normal. The anterior communicating artery is patent. ACA branch vessels are within normal limits bilaterally. There is a high-grade stenosis of the anterior right M2 segment with marked attenuation of distal  branches. Diffuse irregularity present and more posterior left MCA branches. There is moderate irregularity in left MCA branches. Pial collaterals are evident. Posterior circulation: The right vertebral artery is the dominant vessel. Segmental irregularity is present in the left V4 segment without a significant stenosis. PICA origins are visualized and normal. The vertebrobasilar junction is normal. The basilar artery is normal. Both posterior  cerebral arteries originate from the basilar tip. There is some irregularity of the proximal PCA vessels without significant proximal stenosis. Branch vessels are intact. Venous sinuses: The dural sinuses are patent. Anatomic variants: None Delayed phase: No pathologic enhancement is present. Infarcts are well-defined. Review of the MIP images confirms the above findings IMPRESSION: 1. High-grade bilateral proximal ICA stenoses at the carotid bifurcations. 2. 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 3. Hypoplastic left A1 segment. 4. High-grade stenosis of the anterior right M2 segment. 5. Moderate diffuse medium and distal small vessel disease in both the anterior and posterior circulations. 6. Expected evolution of left internal capsule nonhemorrhagic infarct. 7. 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 Electronically Signed   By: Marin Roberts M.D.   On: 02/28/2019 21:01   [name]  Result Date: 02/12/2019 CLINICAL DATA:  Focal neuro deficit for greater than 6 hours. Altered level of consciousness. EXAM: MRI HEAD WITHOUT CONTRAST TECHNIQUE: Multiplanar, multiecho pulse sequences of the brain and surrounding structures were obtained without intravenous contrast. COMPARISON:  CT head without contrast 01/29/2019 FINDINGS: Brain: The diffusion-weighted images confirm an acute nonhemorrhagic infarct involving the genu of the left internal capsule and globus pallidus. T2 signal  changes are associated with the acute infarct, consistent with the subacute time frame. Remote encephalomalacia is again noted right MCA territory. The ventricles are of proportionate to the degree of atrophy. Wallerian degeneration is present in the right cerebral peduncle extending into the pons. Cerebellum is normal. No significant extraaxial fluid collection is present. Vascular: Flow is present in the major intracranial arteries. Skull and upper cervical spine: The craniocervical junction is normal. Upper cervical spine is within normal limits. Marrow signal is unremarkable. Sinuses/Orbits: The paranasal sinuses and mastoid air cells are clear. Bilateral lens replacements are noted. The globes and orbits are within normal limits. IMPRESSION: 1. 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 This is consistent with a time frame of greater than 6 hours. 2. 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 Electronically Signed   By: Marin Roberts M.D.   On: 02/20/2019 19:18   Dg Foot Complete Right  Result Date: 02/28/2019 CLINICAL DATA:  Right foot pain EXAM: RIGHT FOOT COMPLETE - 3+ VIEW COMPARISON:  None. FINDINGS: Comminuted fracture is noted at the base of the first distal phalanx. This extends into the articular surface in multiple locations. No other fracture is seen. Soft tissue swelling is noted. IMPRESSION: Comminuted fracture of the first distal phalanx which extends to the articular surface. Electronically Signed   By: Alcide Clever M.D.   On: 02/08/2019 16:01   Vas [place] Vanice Sarah With/wo Tbi  Result Date: 03/01/2019 LOWER EXTREMITY DOPPLER STUDY Indications: Gangrene. High Risk Factors: Current smoker.  Comparison Study: No prior study on file Performing Technologist: Sherren Kerns RVS  Examination Guidelines: A complete evaluation includes at minimum, Doppler waveform signals and systolic blood pressure  reading at the level of bilateral brachial, anterior tibial, and posterior tibial arteries, when vessel segments are accessible. Bilateral testing is considered an integral part of a complete examination. Photoelectric Plethysmograph (PPG) waveforms and toe systolic pressure readings are included as required and additional duplex testing as needed. Limited examinations for reoccurring indications may be performed as noted.  ABI Findings: +---------+------------------+-----+-------------------+-----------------------+  Right     Rt Pressure (mmHg) Index Waveform            Comment                  +---------+------------------+-----+-------------------+-----------------------+  Brachial  183                      triphasic                                    +---------+------------------+-----+-------------------+-----------------------+  PTA       67                 0.37  dampened monophasic                          +---------+------------------+-----+-------------------+-----------------------+  DP        74                 0.40  dampened monophasic                          +---------+------------------+-----+-------------------+-----------------------+  Great Toe                                              Not done secondary to                                                            gangrene                 +---------+------------------+-----+-------------------+-----------------------+ +--------+------------------+-----+---------+-------+  Left     Lt Pressure (mmHg) Index Waveform  Comment  +--------+------------------+-----+---------+-------+  Brachial 171                      triphasic          +--------+------------------+-----+---------+-------+  PTA      185                1.01  biphasic           +--------+------------------+-----+---------+-------+  DP       176                0.96  biphasic           +--------+------------------+-----+---------+-------+  +-------+-----------+-----------+------------+------------+  ABI/TBI Today's ABI Today's TBI Previous ABI Previous TBI  +-------+-----------+-----------+------------+------------+  Right   0.40                                               +-------+-----------+-----------+------------+------------+  Left    1.01                                               +-------+-----------+-----------+------------+------------+  Summary: Right: 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 Left: Resting left ankle-brachial index is within normal range. No evidence of significant left lower extremity arterial disease.  *See table(s) above for measurements and observations.  Electronically signed by Coral Else MD on 03/01/2019 at 10:49:49 AM.  Final    Vas US Carotid  Result Date: 02/13/2019 Carotid Arterial Duplex Study Indications:                           ICA stenosis. Limitations:                           Technically difficult patient due                                        movement, cooperation, and positioning. Pre-Surgical Evaluation & Surgical     Stenosis at bifurcation only. ICA is not Correlation:                           normal past stenosis. Bifurcation is                                        located near the [place]. Right sided                                        80 to 99% ICA stenosis originates within                                        the bifurcation and extends approximately                                        1.1 cm into the proximal ICA. There is no                                        apparent hemodynamically significant                                        stenosis involving the mid or distal ICA.                                        Left sided 80 to 99% ICA stenosis                                        originates within the bifurcation and                                        extends approximately 1.1 cm into the  proximal ICA. There is no apparent                                        hemodynamically significant stenosis                                        involving the mid or distal ICA. Performing Technologist: Chanda Busing RVT  Examination Guidelines: A complete evaluation includes B-mode imaging, spectral Doppler, color Doppler, and power Doppler as needed of all accessible portions of each vessel. Bilateral testing is considered an integral part of a complete examination. Limited examinations for reoccurring indications may be performed as noted.  Right Carotid Findings: +----------+--------+--------+--------+--------+--------+             PSV cm/s EDV cm/s Stenosis Describe Comments  +----------+--------+--------+--------+--------+--------+  CCA Distal 63       16                                   +----------+--------+--------+--------+--------+--------+  ICA Prox   397      115      80-99%                      +----------+--------+--------+--------+--------+--------+  ICA Distal 85       31                                   +----------+--------+--------+--------+--------+--------+  Left Carotid Findings: +----------+--------+--------+--------+--------+--------+             PSV cm/s EDV cm/s Stenosis Describe Comments  +----------+--------+--------+--------+--------+--------+  CCA Distal 36       9                                    +----------+--------+--------+--------+--------+--------+  ICA Prox   358      123      80-99%                      +----------+--------+--------+--------+--------+--------+  ICA Distal 106      29                                   +----------+--------+--------+--------+--------+--------+  Summary: Right Carotid: Right sided 80 to 99% ICA stenosis originates within the                bifurcation and extends approximately 1.1 cm into the proximal                ICA. There is no apparent hemodynamically significant stenosis                involving the mid  or distal ICA. Left Carotid: Left sided 80 to 99% ICA stenosis originates within the               bifurcation and extends approximately 1.1 cm into the proximal               ICA. There is no apparent hemodynamically significant stenosis  involving the mid or distal ICA.  *See table(s) above for measurements and observations.  Electronically signed by Coral Else MD on 02/11/2019 at 1:35:19 PM.    Final    US Abdomen Limited Ruq  Result Date: 03/01/2019 CLINICAL DATA:  Elevated LFTs. EXAM: ULTRASOUND ABDOMEN LIMITED RIGHT UPPER QUADRANT COMPARISON:  None. FINDINGS: Gallbladder: Physiologically distended. No gallstones or wall thickening visualized. No sonographic Murphy sign noted by sonographer. Common bile duct: Diameter: 6 mm, normal. Liver: No focal lesion identified. Diffusely increased and heterogeneous in parenchymal echogenicity. Portal vein is patent on color Doppler imaging with normal direction of blood flow towards the liver. IMPRESSION: 1. Hepatic steatosis. 2. Normal sonographic appearance of gallbladder and biliary tree. Electronically Signed   By: Narda Rutherford M.D.   On: 03/01/2019 23:36    Lab Data:  CBC: Recent Labs  Lab 02/09/2019 1450 02/28/19 0338 03/01/19 0348 02/22/2019 0337 03-25-2019 0316  WBC 14.6* 13.9* 12.4* 12.9* 12.6*  NEUTROABS 10.9*  --  7.7  --   --   HGB 18.4* 16.8 15.5 16.9 13.8  HCT 55.4* 49.3 46.9 48.2 41.4  MCV 88.4 88.5 89.2 86.5 87.7  PLT 314 274 259 219 253   Basic Metabolic Panel: Recent Labs  Lab 02/20/2019 1450 02/28/19 0338 03/01/19 0348 02/23/2019 0337 2019/03/25 0316  NA 141 141 140 136 134*  K 4.1 3.8 3.5 3.8 3.2*  CL 101 105 103 103 103  CO2 23 23 25 23 23   GLUCOSE 211* 213* 123* 143* 156*  BUN 33* 25* 16 13 14   CREATININE 1.11 1.15 0.99 0.89 1.05  CALCIUM 9.7 8.9 8.6* 8.8* 8.1*   GFR: Estimated Creatinine Clearance: 97.8 mL/min (by C-G formula based on SCr of 1.05 mg/dL). Liver Function Tests: Recent Labs  Lab  02/18/2019 1450 02/28/19 0338 03/01/19 0348 03/25/2019 0316  AST 136* 112* 97* 59*  ALT 69* 67* 65* 62*  ALKPHOS 98 79 75 75  BILITOT 1.5* 0.9 0.6 0.5  PROT 7.5 6.2* 6.1* 5.3*  ALBUMIN 3.7 3.1* 2.9* 2.4*   Recent Labs  Lab 02/28/2019 1450  LIPASE 36   Recent Labs  Lab 02/16/2019 1519  AMMONIA 25   Coagulation Profile: No results for input(s): INR, PROTIME in the last 168 hours. Cardiac Enzymes: Recent Labs  Lab 03/09/2019 1450 03/06/2019 2235 02/28/19 0338 02/28/19 1004 03/01/19 0348  CKTOTAL 3,323*  --  2,223*  --  1,051*  TROPONINI 0.03* <0.03 0.06* 0.03*  --    BNP (last 3 results) No results for input(s): PROBNP in the last 8760 hours. HbA1C: No results for input(s): HGBA1C in the last 72 hours. CBG: Recent Labs  Lab 02/14/2019 1247 02/10/2019 1637 02/12/2019 2127 03-25-2019 0741 03/25/2019 1125  GLUCAP 131* 177* 164* 139* 129*   Lipid Profile: Recent Labs    03/01/19 0348  CHOL 136  HDL 20*  LDLCALC 81  TRIG 657*  CHOLHDL 6.8   Thyroid Function Tests: No results for input(s): TSH, T4TOTAL, FREET4, T3FREE, THYROIDAB in the last 72 hours. Anemia Panel: No results for input(s): VITAMINB12, FOLATE, FERRITIN, TIBC, IRON, RETICCTPCT in the last 72 hours. Urine analysis:    Component Value Date/Time   COLORURINE YELLOW 03/01/2019 1604   APPEARANCEUR CLEAR 02/26/2019 1604   LABSPEC 1.026 03/01/2019 1604   PHURINE 5.0 02/09/2019 1604   GLUCOSEU 150 (A) 03/07/2019 1604   HGBUR SMALL (A) 02/26/2019 1604   BILIRUBINUR NEGATIVE 02/09/2019 1604   KETONESUR 20 (A) 02/15/2019 1604   PROTEINUR NEGATIVE 02/26/2019 1604  NITRITE NEGATIVE 03/09/2019 1604   LEUKOCYTESUR NEGATIVE 2019/03/09 1604     Challis Crill M.D. [facility] 02/15/2019, 11:29 AM  Pager: [phone] Between 7am to 7pm - call Pager - [phone]  After 7pm go to [email or web address] - password [name]  Call night coverage person covering after 7pm

## 2019-03-03 NOTE — Progress Notes (Signed)
Pt without sitter after 0300. He has been calm and relaxed. Denies pain. He did pull out his IV again. IV team notified to place another.

## 2019-03-03 NOTE — Progress Notes (Signed)
Inpatient Diabetes Program Recommendations  AACE/ADA: New Consensus Statement on Inpatient Glycemic Control (2015)  Target Ranges:  Prepandial:   less than 140 mg/dL      Peak postprandial:   less than 180 mg/dL (1-2 hours)      Critically ill patients:  140 - 180 mg/dL   Lab Results  Component Value Date   GLUCAP 129 (H) 02/24/2019   HGBA1C 7.7 (H) 03/01/2019    Review of Glycemic Control  Diabetes history: ? New DM2 diagnosis with Hgb A1c 7.7%  Outpatient Diabetes medications: none  Current orders for Inpatient glycemic control: Novolog Sensitive correction scale (0-9 units) tid      Met with patient regarding his elevated blood sugar. Per chart he doesn't have a PCP. Ordered and briefly reviewed "living well with diabetes" book. Asked patient what he drinks at home and he said Pepsi. Encouraged more water and unsweetened drinks.  Have placed case management consult r/t PCP appointment at discharge for diabetes management. Note there is a psych consult ordered and also a possibility of him going to a SNF.   MD if at d/c patient is going home please order glucometer (includes lancets and strips) #[id number].   -- Will follow during hospitalization.--  Jonna Clark RN, MSN Diabetes Coordinator Inpatient Glycemic Control Team Team Pager: [phone] (8am-5pm)

## 2019-03-04 ENCOUNTER — Encounter (HOSPITAL_COMMUNITY): Payer: Self-pay | Admitting: Vascular Surgery

## 2019-03-04 DIAGNOSIS — Z7189 Other specified counseling: Secondary | ICD-10-CM

## 2019-03-04 LAB — COMPREHENSIVE METABOLIC PANEL
ALT: 58 U/L — ABNORMAL HIGH (ref 0–44)
AST: 42 U/L — ABNORMAL HIGH (ref 15–41)
Albumin: 2.2 g/dL — ABNORMAL LOW (ref 3.5–5.0)
Alkaline Phosphatase: 62 U/L (ref 38–126)
Anion gap: 7 (ref 5–15)
BUN: 11 mg/dL (ref 6–20)
CALCIUM: 8.2 mg/dL — AB (ref 8.9–10.3)
CO2: 22 mmol/L (ref 22–32)
Chloride: 104 mmol/L (ref 98–111)
Creatinine, Ser: 0.92 mg/dL (ref 0.61–1.24)
GFR calc Af Amer: >60 mL/min (ref >60)
GFR calc non Af Amer: >60 mL/min (ref >60)
Glucose, Bld: 141 mg/dL — ABNORMAL HIGH (ref 70–99)
Potassium: 3.5 mmol/L (ref 3.5–5.1)
Sodium: 133 mmol/L — ABNORMAL LOW (ref 135–145)
TOTAL PROTEIN: 5.3 g/dL — AB (ref 6.5–8.1)
Total Bilirubin: 0.6 mg/dL (ref 0.3–1.2)

## 2019-03-04 LAB — CBC
HEMATOCRIT: 38.1 % — AB (ref 39.0–52.0)
Hemoglobin: 13.3 g/dL (ref 13.0–17.0)
MCH: 30.6 pg (ref 26.0–34.0)
MCHC: 34.9 g/dL (ref 30.0–36.0)
MCV: 87.6 fL (ref 80.0–100.0)
Platelets: 234 10*3/uL (ref 150–400)
RBC: 4.35 MIL/uL (ref 4.22–5.81)
RDW: 12.7 % (ref 11.5–15.5)
WBC: 15.2 10*3/uL — ABNORMAL HIGH (ref 4.0–10.5)
nRBC: 0 % (ref 0.0–0.2)

## 2019-03-04 LAB — GLUCOSE, CAPILLARY
GLUCOSE-CAPILLARY: 150 mg/dL — AB (ref 70–99)
Glucose-Capillary: 223 mg/dL — ABNORMAL HIGH (ref 70–99)

## 2019-03-04 MED ORDER — SENNOSIDES-DOCUSATE SODIUM 8.6-50 MG PO TABS
2.0000 | ORAL_TABLET | Freq: Every day | ORAL | Status: DC
  Administered 2019-03-04 – 2019-03-08 (×5): 2 via ORAL
  Filled 2019-03-04 (×5): qty 2

## 2019-03-04 MED ORDER — PENTOXIFYLLINE ER 400 MG PO TBCR: 400.0000 mg | EXTENDED_RELEASE_TABLET | Freq: Three times a day (TID) | ORAL | 0 refills | Status: AC

## 2019-03-04 MED ORDER — ATORVASTATIN CALCIUM 10 MG PO TABS: 20.0000 mg | ORAL_TABLET | Freq: Every day | ORAL | Status: DC

## 2019-03-04 MED ORDER — METOPROLOL TARTRATE 5 MG/5ML IV SOLN: 5.0000 mg | Freq: Once | INTRAVENOUS | Status: DC

## 2019-03-04 MED ORDER — CLOPIDOGREL BISULFATE 75 MG PO TABS
75.0000 mg | ORAL_TABLET | Freq: Every day | ORAL | Status: DC
  Administered 2019-03-05 – 2019-03-08 (×4): 75 mg via ORAL
  Filled 2019-03-04 (×5): qty 1

## 2019-03-04 MED ORDER — OXYCODONE HCL 5 MG PO TABS: 5.0000 mg | ORAL_TABLET | ORAL | 0 refills | Status: AC | PRN

## 2019-03-04 MED ORDER — METHOCARBAMOL 500 MG PO TABS: 500.0000 mg | ORAL_TABLET | Freq: Four times a day (QID) | ORAL | 0 refills | Status: DC | PRN

## 2019-03-04 MED ORDER — METOPROLOL TARTRATE 25 MG PO TABS
25.0000 mg | ORAL_TABLET | Freq: Two times a day (BID) | ORAL | Status: DC
  Administered 2019-03-05 (×2): 25 mg via ORAL
  Filled 2019-03-04 (×3): qty 1

## 2019-03-04 MED ORDER — CLOPIDOGREL BISULFATE 75 MG PO TABS
300.0000 mg | ORAL_TABLET | ORAL | Status: AC
  Filled 2019-03-04: qty 4

## 2019-03-04 MED ORDER — HEPARIN (PORCINE) 25000 UT/250ML-% IV SOLN
1600.0000 [IU]/h | INTRAVENOUS | Status: DC
  Administered 2019-03-04: 1600 [IU]/h via INTRAVENOUS
  Filled 2019-03-04: qty 250

## 2019-03-04 MED ORDER — HEPARIN BOLUS VIA INFUSION
5000.0000 [IU] | Freq: Once | INTRAVENOUS | Status: DC
  Filled 2019-03-04: qty 5000

## 2019-03-04 MED ORDER — METOPROLOL TARTRATE 5 MG/5ML IV SOLN
INTRAVENOUS | Status: AC
  Administered 2019-03-04: 16:00:00
  Filled 2019-03-04: qty 5

## 2019-03-04 MED ORDER — RISPERIDONE 0.5 MG PO TABS
0.5000 mg | ORAL_TABLET | Freq: Two times a day (BID) | ORAL | Status: DC
  Administered 2019-03-05 – 2019-03-08 (×9): 0.5 mg via ORAL
  Filled 2019-03-04 (×13): qty 1

## 2019-03-04 NOTE — Progress Notes (Signed)
58 year old male with complex situation.  He has a symptomatic high-grade left carotid stenosis with plan for a left TCAR on Monday.  In addition he has a gangrenous right great toe and underwent a toe amp and needs complex revascularization of his right lower extremity.  I talked with his brother yesterday and explained sequence of events with carotid revascularization on Monday followed by leg revascularization next week.  Dr. Isidoro Donning asked me to reach out to the brother today given his discussion with palliative care about potentially not wanting any further surgeries or invasive procedures for his brother.  I reviewed with Mr. Wicker brother his current clinical status from a vascular standpoint and what our plan was moving forward.  Ultimately at the end of the discussion patient's brother is going to meet with palliative care nursing again today and wants to further discuss options with his brother about best option moving forward.  I reiterated that we do have a option for carotid revascularization as well as an option for revascularization of his right leg that potentially would be limb saving.  He needs to think what his brother would want in this instance.  I discussed that we could follow-up with him again on Monday morning before schedule procedure and/or earlier once a decision is made.   Cephus Shelling, MD [facility] Office: [phone] Pager: [phone]   Cephus Shelling

## 2019-03-04 NOTE — Progress Notes (Signed)
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  Pt underwent amputations of rt great toe. Pt with severe PVD.   OT comments  OF NOTE: Treatment session performed prior to discontinue of OT orders. OT acknowledges the discontinuation of OT services at this time  Pt progressing towards OT goals with improved transfers, improved mentation (able to follow simple commands). Pt unable to perform stand pivot transfer to [facility] and then recliner without WB through RLE - he does attempt to do so through the heel. Pt agreeable to cutting hair (noticeably matted) when OT pointed out how much cleaner he would feel, smell better etc. OT will continue to follow acutely and focus on hygeine activities and transfers - recommend post-op shoe at this time to facilitate transfers and make it safer? Will defer to MD judgement as I do not believe he will be able to maintain WB precautions at this time.    Follow Up Recommendations  SNF    Equipment Recommendations  3 in 1 bedside commode;Wheelchair (measurements OT);Wheelchair cushion (measurements OT);Hospital bed    Recommendations for Other Services Other (comment)(continued work with Palliative)    Precautions / Restrictions Precautions Precautions: Fall Precaution Comments: R 1st phalanx fx (1st ray amp scheduled for 3/24) Restrictions Weight Bearing Restrictions: Yes RLE Weight Bearing: Non weight bearing       Mobility Bed Mobility Overal bed mobility: Needs Assistance Bed Mobility: Supine to Sit     Supine to  sit: Mod assist;+2 for physical assistance;HOB elevated     General bed mobility comments: Assist to bring legs off of bed and elevate trunk into sitting  Transfers Overall transfer level: Needs assistance Equipment used: Rolling walker (2 wheeled) Transfers: Sit to/from Stand Sit to Stand: Min assist;+2 physical assistance Stand pivot transfers: +2 physical assistance;Min assist       General transfer comment: Assist to bring hips up and for balance. Verbal cues for hand placement and to attempt to decr weight bearing on rt foot. Pt unable to maintain NWB.    Balance Overall balance assessment: Needs assistance Sitting-balance support: No upper extremity supported;Feet supported Sitting balance-Leahy Scale: Good     Standing balance support: Bilateral upper extremity supported Standing balance-Leahy Scale: Poor Standing balance comment: walker and min assist for static standing                           ADL either performed or assessed with clinical judgement   ADL Overall ADL's : Needs assistance/impaired                         Toilet Transfer: Minimal assistance;+2 for physical assistance;+2 for safety/equipment;Stand-pivot;BSC;RW Toilet Transfer Details (indicate cue type and reason): Pt WB through heel on RLE Toileting- Clothing Manipulation and Hygiene: Maximal assistance;+2 for safety/equipment;Sit to/from stand         General ADL Comments: Talked to Trey Paula about benefits of cutting his hair (noticeably matted)     Vision       Perception     Praxis      Cognition  Arousal/Alertness: Awake/alert Behavior During Therapy: Flat affect Overall Cognitive Status: No family/caregiver present to determine baseline cognitive functioning                       Memory: Decreased recall of precautions Following Commands: Follows one step commands with increased time Safety/Judgement: Decreased awareness of deficits   Problem Solving:  Slow processing;Requires verbal cues;Requires tactile cues          Exercises     Shoulder Instructions       General Comments      Pertinent Vitals/ Pain       Pain Assessment: No/denies pain  Home Living                                          Prior Functioning/Environment              Frequency  Min 2X/week        Progress Toward Goals  OT Goals(current goals can now be found in the care plan section)  Progress towards OT goals: Progressing toward goals  Acute Rehab OT Goals Patient Stated Goal: not stated  Plan Discharge plan remains appropriate;Frequency remains appropriate    Co-evaluation    PT/OT/SLP Co-Evaluation/Treatment: Yes Reason for Co-Treatment: Complexity of the patient's impairments (multi-system involvement);For patient/therapist safety;To address functional/ADL transfers PT goals addressed during session: Mobility/safety with mobility;Balance;Proper use of DME OT goals addressed during session: ADL's and self-care;Strengthening/ROM;Proper use of Adaptive equipment and DME      AM-PAC OT "6 Clicks" Daily Activity     Outcome Measure   Help from another person eating meals?: A Little Help from another person taking care of personal grooming?: A Lot Help from another person toileting, which includes using toliet, bedpan, or urinal?: A Lot Help from another person bathing (including washing, rinsing, drying)?: A Lot Help from another person to put on and taking off regular upper body clothing?: A Little Help from another person to put on and taking off regular lower body clothing?: A Lot 6 Click Score: 14    End of Session Equipment Utilized During Treatment: Gait belt;Rolling walker  OT Visit Diagnosis: Unsteadiness on feet (R26.81);Repeated falls (R29.6)   Activity Tolerance Patient tolerated treatment well   Patient Left in chair;with call bell/phone within reach;with nursing/sitter in room   Nurse  Communication Mobility status;Precautions        Time: 2010-0712 OT Time Calculation (min): 32 min  Charges: OT General Charges $OT Visit: 1 Visit OT Treatments $Self Care/Home Management : 8-22 mins  Sherryl Manges OTR/L Acute Rehabilitation Services Pager: [phone] Office: [phone]   Evern Bio Sina Sumpter 03/04/2019, 2:15 PM

## 2019-03-04 NOTE — Progress Notes (Signed)
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  Pt underwent amputations of rt great toe. Pt with severe PVD.    PT Comments    Pt with limited progress due to unable to maintain NWB on RLE. After treatment spoke with palliative medicine and pt to go to SNF with comfort care. Will sign off for PT.   Follow Up Recommendations  SNF(with hospice)     Equipment Recommendations  (defer to next venue)    Recommendations for Other Services       Precautions / Restrictions Precautions Precautions: Fall Precaution Comments: R 1st phalanx fx (1st ray amp scheduled for 3/24) Restrictions Weight Bearing Restrictions: Yes RLE Weight Bearing: Non weight bearing    Mobility  Bed Mobility Overal bed mobility: Needs Assistance Bed Mobility: Supine to Sit     Supine to sit: Mod assist;+2 for physical assistance;HOB elevated     General bed mobility comments: Assist to bring legs off of bed and elevate trunk into sitting  Transfers Overall transfer level: Needs assistance Equipment used: Rolling walker (2 wheeled) Transfers: Sit to/from Stand Sit to Stand: Min assist;+2 physical assistance Stand pivot transfers: +2 physical assistance;Min assist       General transfer comment: Assist to bring hips up and for balance. Verbal cues for hand placement and to attempt to decr weight bearing on rt foot. Pt unable to maintain NWB.  Ambulation/Gait Ambulation/Gait assistance: Min assist;+2 safety/equipment Gait Distance (Feet): 2 Feet Assistive device: Rolling  walker (2 wheeled) Gait Pattern/deviations: Step-through pattern;Decreased step length - right;Decreased step length - left Gait velocity: decreased Gait velocity interpretation: <1.31 ft/sec, indicative of household ambulator General Gait Details: Pivotal steps from bsc to recliner. Pt unable to maintain NWB. Pt primarily weight bearing through heel.   Stairs             Wheelchair Mobility    Modified Rankin (Stroke Patients Only)       Balance Overall balance assessment: Needs assistance Sitting-balance support: No upper extremity supported;Feet supported Sitting balance-Leahy Scale: Good     Standing balance support: Bilateral upper extremity supported Standing balance-Leahy Scale: Poor Standing balance comment: walker and min assist for static standing                            Cognition Arousal/Alertness: Awake/alert Behavior During Therapy: Flat affect Overall Cognitive Status: No family/caregiver present to determine baseline cognitive functioning                       Memory: Decreased recall of precautions Following Commands: Follows one step commands with increased time Safety/Judgement: Decreased awareness of deficits   Problem Solving: Slow processing;Requires verbal cues;Requires tactile cues        Exercises      General Comments        Pertinent Vitals/Pain      Home Living                      Prior Function  PT Goals (current goals can now be found in the care plan section) Progress towards PT goals: Not progressing toward goals - comment(due to unable to maintain weight bearing)    Frequency           PT Plan Discharge plan needs to be updated    Co-evaluation              AM-PAC PT "6 Clicks" Mobility   Outcome Measure  Help needed turning from your back to your side while in a flat bed without using bedrails?: A Little Help needed moving from lying on your back to  sitting on the side of a flat bed without using bedrails?: A Lot Help needed moving to and from a bed to a chair (including a wheelchair)?: A Little Help needed standing up from a chair using your arms (e.g., wheelchair or bedside chair)?: A Little Help needed to walk in hospital room?: A Lot Help needed climbing 3-5 steps with a railing? : Total 6 Click Score: 14    End of Session Equipment Utilized During Treatment: Gait belt Activity Tolerance: Patient tolerated treatment well Patient left: in chair;with call bell/phone within reach;with chair alarm set;with nursing/sitter in room Nurse Communication: Mobility status PT Visit Diagnosis: Other abnormalities of gait and mobility (R26.89);Difficulty in walking, not elsewhere classified (R26.2)     Time: 4270-6237 PT Time Calculation (min) (ACUTE ONLY): 32 min  Charges:  $Gait Training: 8-22 mins                     [facility] PT Acute Rehabilitation Services Pager [phone] Office [phone]    Angelina Ok [facility] 03/04/2019, 1:58 PM

## 2019-03-04 NOTE — Progress Notes (Signed)
[facility]                                                                              Patient Demographics  Noah Thomas, is a 58 y.o. male, DOB - Apr 09, 1961, XLK:[id number]  Admit date - 2019/03/24   Admitting Physician Elease Etienne, MD  Outpatient Primary MD for the patient is Patient, No Pcp Per  Outpatient specialists:   LOS - 5  days   Medical records reviewed and are as summarized below:    Chief Complaint  Patient presents with   Altered Mental Status       Brief summary   :58 year old male, lives alone in a mobile home and independent, has not seen a physician in a long time, PMH of significant longstanding mental health issues, not on prescription medications, tobacco abuse, presented to [facility] ED on 03/24/19 via EMS with altered mental status. History obtained from brother. Brother indicated that patient was normal until his teenage years when he started becoming paranoid and agitated while living with his parents, eventually had to be placed in [facility]. Over the years patient has calmed down, able to go to the grocery store for food, cigarettes but has no insight and unable to truly care for himself. He lives in [place] conditions to an extent that when EMS went to retrieve him today, authorities were planning to "condemn" his trailer home. Patient's brother has given him a debit card and provides with the finances. Patient last visited his brother on [birth year] his birthday, at which point he was in his usual state of health except he complained of some toe pain but was managing to ambulate. Since then the brother was trying to reach him by phone for several days and went over to his house a day before the admission but did not get an answer at the door. EMS found the patient on the ground in filthy condition, confused. Patient is unable to provide any history.   ED Course:Noted to be in filthy condition, tachycardic, not febrile, not  hypotensive, leukocytosis, elevated lactate, right toe exam suggestive of possible cellulitis, initiated sepsis protocol and received IV fluids and broad-spectrum IV antibiotics. Orthopedics consulted by EDP and will see him in the morning. WBC 14.6, hemoglobin 18.4, glucose 211, BUN 33, AST 136, ALT 69, total bilirubin 1.5, CK 3000 323, troponin 0 0.03, urine microscopy not suggestive of UTI, chest x-ray negative, CT head suggests subacute to chronic ischemia, right foot x-ray showed comminuted fracture of the first distal phalanx.  Assessment & Plan    Principal Problem:   Right great toe gangrene/ abscess:  - patient was found to have comminuted fracture of the right first distal phalanx extending to the articular surface -Patient was placed on IV vancomycin and cefepime, stopped 24 hours postop per Ortho recommendations.   -Per Dr. Lajoyce Corners,  wound [facility] for 1 week, NWB on R foot -Vascular surgery following closely, underwent bilateral lower extremity arteriogram on 3/25, will eventually need right iliac thrombectomy, right iliac stent and right femoral AKA popliteal bypass after the carotid procedure next week. -Patient was started on heparin drip per vascular surgery  recommendation  Acute CVA/ bilat ICA stenosis: involving the left internal capsule and basal ganglia.  -CT head showed chronic atrophy, prior R MCA infarction with encephalomalacia, subacute to chronic left internal capsule/BG infarct -MRI of the brain showed acute/subacute infarct left internal capsule/BG.  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 Raynald Kemp, recommended aspirin 81 mg and Plavix 75 mg daily for 3 weeks and then aspirin alone.  Plavix held for OR today. -PT recommended skilled nursing facility.  Patient lives in a mobile home, currently condemned. -Plan for carotid procedure next  week, goals of care being addressed.  Acute metabolic encephalopathy -Secondary to CVA, acute infection, dehydration, may have underlying psych illness -Per patient's brother, reportedly he had a mental illness since a teenager, was in but not at one point, as an adult never worked more than a week and lives alone.  Patient has been dependent on his family all his adult life for food enlarging. -Per psychiatry recommendations, EKG obtained, QTC improved.  Started on Risperdal -Alert and oriented x3, palliative care following for goals of care  Diabetes mellitus type 2 -New diagnosis, hemoglobin A1c 7.7.  Patient had not been following with doctors. -Patient was seen by diabetic coronary care, recommended metformin at the time of discharge. -For now continue sliding scale insulin   Transaminitis -Improving, right upper quadrant ultrasound showed fatty liver  Generalized weakness, acute CVA PT OT consulted, recommended skilled nursing facility  Rhabdomyolysis, acute, mild -Resolved with IV fluids  Goals of care Palliative care discussed with the patient and brother yesterday and I discussed with the patient today who is currently alert and oriented.  Patient does not want any invasive procedures and does not want to be in the hospital.  Patient's brother had also reported similar views yesterday to the palliative care. Discussed with vascular surgery, Dr. Chestine Spore today and relayed the above.  Dr. Chestine Spore will discuss with patient's brother to affirm patient care goals. If patient is at high risk of recurrent stroke, limb loss, worsening infection, death without invasive interventions, then likely poor prognosis and life expectancy probably less than 6 months, would qualify for hospice care. Will await Dr. Ophelia Charter discussion with patient's brother for further disposition plans.  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 03/01/19 1706 02/21/2019 0805   02/28/19 2100  vancomycin (VANCOCIN) 1,000 mg in sodium chloride 0.9 % 250 mL IVPB     1,000 mg 250 mL/hr over 60 Minutes Intravenous Every 12  hours 02/28/19 1707 02/18/2019 1159   02/28/19 0600  vancomycin (VANCOCIN) IVPB 1000 mg/200 mL premix  Status:  Discontinued     1,000 mg 200 mL/hr over 60 Minutes Intravenous Every 12 hours 02/23/2019 1708 02/28/19 1707   02/28/19 0600  ceFEPIme (MAXIPIME) 2 g in sodium chloride 0.9 % 100 mL IVPB     2 g 200 mL/hr over 30 Minutes Intravenous Every 12 hours 02/15/2019 1708 02/11/2019 0959   02/09/2019 1700  vancomycin (VANCOCIN) 1,500 mg in sodium chloride 0.9 % 500 mL IVPB     1,500 mg 250 mL/hr over 120 Minutes Intravenous  Once 03/01/2019 1613 02/22/2019 2250   02/24/2019 1615  ceFEPIme (MAXIPIME) 2 g in sodium chloride 0.9 % 100 mL IVPB     2 g 200 mL/hr over 30 Minutes Intravenous  Once 02/26/2019 1610 03/01/2019 1715   02/13/2019 1615  metroNIDAZOLE (FLAGYL) IVPB 500 mg     500 mg 100 mL/hr over 60 Minutes Intravenous  Once 03/09/2019 1610 02/26/2019 1845   02/22/2019 1615  vancomycin (VANCOCIN) IVPB 1000 mg/200 mL premix  Status:   Discontinued     1,000 mg 200 mL/hr over 60 Minutes Intravenous  Once 02/10/2019 1610 02/26/2019 1613         Medications  Scheduled Meds:  aspirin EC  81 mg Oral Daily   docusate sodium  100 mg Oral BID   folic acid  1 mg Oral Daily   heparin  5,000 Units Intravenous Once   Influenza vac split quadrivalent PF  0.5 mL Intramuscular Tomorrow-1000   insulin aspart  0-9 Units Subcutaneous TID WC   multivitamin with minerals  1 tablet Oral Daily   nicotine  21 mg Transdermal Daily   pentoxifylline  400 mg Oral TID WC   pneumococcal 23 valent vaccine  0.5 mL Intramuscular Tomorrow-1000   risperiDONE  0.5 mg Oral BID   sodium chloride flush  3 mL Intravenous Q12H   sodium chloride flush  3 mL Intravenous Q12H   thiamine  100 mg Oral Daily   Continuous Infusions:  sodium chloride 50 mL/hr at 02/13/2019 0300   sodium chloride     heparin 1,600 Units/hr (03/04/19 0659)   methocarbamol (ROBAXIN) IV     PRN Meds:.sodium chloride, acetaminophen **OR** acetaminophen, bisacodyl, hydrALAZINE, HYDROmorphone (DILAUDID) injection, labetalol, magnesium citrate, methocarbamol **OR** methocarbamol (ROBAXIN) IV, ondansetron **OR** ondansetron (ZOFRAN) IV, oxyCODONE, oxyCODONE, polyethylene glycol, senna-docusate, sodium chloride flush, sodium chloride flush      Subjective:   Noah Thomas was seen and examined today.  Appears comfortable, denies any complaints.  Patient denies dizziness, chest pain, shortness of breath, abdominal pain, N/V/D/C, new weakness, numbess, tingling. No fevers, no acute issues overnight.  Objective:   Vitals:   02/18/2019 2316 03/04/19 0000 03/04/19 0400 03/04/19 0858  BP:   (!) 151/85 (!) 158/89  Pulse: 95  87 84  Resp: 19  17 19   Temp:   98.3 F (36.8 C) (!) 97.4 F (36.3 C)  TempSrc:   Oral Oral  SpO2: 91% (!) 86% 91% 93%  Weight:   104.5 kg   Height:   6' (1.829 m)     Intake/Output Summary (Last 24 hours) at 03/04/2019 1040 Last data  filed at 03/04/2019 0600 Gross per 24 hour  Intake 1643.39 ml  Output 1440 ml  Net 203.39 ml     Wt Readings from Last 3 Encounters:  03/04/19 104.5 kg   Physical Exam  General: Alert and oriented x 3, NAD  Eyes:   HEENT:    Cardiovascular: S1 S2 clear, RRR. No pedal edema b/l  Respiratory: CTAB, no wheezing, rales or rhonchi  Gastrointestinal: Soft, nontender, nondistended, NBS  Ext: no pedal edema bilaterally  Neuro: no new deficits  Musculoskeletal: No cyanosis, clubbing  Skin: Right foot dressing, left foot in boot  Psych: Normal affect and demeanor, alert and oriented x3     Data Reviewed:  I have personally reviewed following labs and imaging studies  Micro Results Recent Results (from the past 240 hour(s))  Urine culture     Status: None   Collection Time: 03/06/2019  4:04 PM  Result Value Ref Range Status   Specimen Description URINE, CATHETERIZED  Final   Special Requests Normal  Final   Culture   Final    NO GROWTH Performed at [facility], [address] [place], [place] [address]    Report Status 02/28/2019 FINAL  Final  Surgical pcr screen     Status: None   Collection Time: 03/01/19  5:15 PM  Result Value Ref Range Status   MRSA, PCR NEGATIVE NEGATIVE Final   Staphylococcus aureus NEGATIVE NEGATIVE Final    Comment: (NOTE) The Xpert SA Assay (FDA approved for NASAL specimens in patients 42 years of age and older), is one component of a comprehensive surveillance program. It is not intended to diagnose infection nor to guide or monitor treatment. Performed at [facility], [address] [place], [place] [address]     Radiology Reports Ct Angio Head W Or Wo Contrast  Result Date: 02/28/2019 CLINICAL DATA:  Stroke follow-up. 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 EXAM: CT ANGIOGRAPHY HEAD AND NECK TECHNIQUE: Multidetector CT imaging of the head and neck was performed using the  standard protocol during bolus administration of intravenous contrast. Multiplanar CT image reconstructions and MIPs were obtained to evaluate the vascular anatomy. Carotid stenosis measurements (when applicable) are obtained utilizing NASCET criteria, using the distal internal carotid diameter as the denominator. CONTRAST:  75mL ISOVUE-370 IOPAMIDOL (ISOVUE-370) INJECTION 76% COMPARISON:  MRI of the brain 03/04/2019 FINDINGS: CT HEAD FINDINGS Brain: The left internal capsule infarct is again noted, now slightly lower density than on the previous CT. No new infarct is present. The remote right MCA territory encephalomalacia is stable. The ventricles are of proportionate to the degree of atrophy. No significant extraaxial fluid collection is present. The brainstem and cerebellum are within normal limits. Vascular: Atherosclerotic calcifications are present within the cavernous internal carotid arteries bilaterally. There is no hyperdense vessel. Skull: The craniocervical junction is normal. Upper cervical spine is within normal limits. Marrow signal is unremarkable. Sinuses: Scattered opacification of left ethmoid air cells are present. There are no fluid levels. The paranasal sinuses and mastoid air cells are otherwise clear. Orbits: The globes and orbits are within normal limits. Review of the MIP images confirms the above findings CTA NECK FINDINGS Aortic arch: A 3 vessel arch configuration is present. Minimal atherosclerotic changes are present at the great vessel origins. There is no significant stenosis or aneurysm. Right carotid system: The right common carotid artery is within normal limits. Dense calcifications are present at the right carotid bifurcation. There is a high-grade stenosis at the carotid bifurcation. Lumen is narrowed to less than 1 mm. The more distal right common carotid artery is within normal limits to the skull base. Left carotid system: The left common carotid artery demonstrates some  atherosclerotic irregularity. There is a high-grade,  near occlusive stenosis of the left internal carotid artery at its bifurcation. The cervical left ICA is otherwise normal. Vertebral arteries: Extensive atherosclerotic calcifications are present along the vertebral arteries bilaterally. The right vertebral artery is dominant. There is a high-grade stenosis at the proximal left vertebral artery. No significant stenosis is present in the right vertebral artery. The left vertebral artery is reconstituted at the distal V1 segment. Extensive atherosclerotic changes are present throughout the V2 segment. These are high-grade stenosis at the level of C1. Skeleton: Vertebral body heights alignment are maintained. No focal lytic or blastic lesions are present. Other neck: The soft tissues the neck are otherwise unremarkable. No focal mucosal or submucosal lesions are present. Salivary glands are within normal limits. No significant adenopathy is present. Thyroid is normal. Upper chest: Next mild dependent atelectasis is present. The lung apices are otherwise clear. Thoracic inlet is within normal limits. Review of the MIP images confirms the above findings CTA HEAD FINDINGS Anterior circulation: Atherosclerotic calcifications are present within the cavernous internal carotid arteries bilaterally without a significant stenosis through the ICA termini. The left A1 is hypoplastic. The right A1 is normal. The anterior communicating artery is patent. ACA branch vessels are within normal limits bilaterally. There is a high-grade stenosis of the anterior right M2 segment with marked attenuation of distal branches. Diffuse irregularity present and more posterior left MCA branches. There is moderate irregularity in left MCA branches. Pial collaterals are evident. Posterior circulation: The right vertebral artery is the dominant vessel. Segmental irregularity is present in the left V4 segment without a significant stenosis. PICA  origins are visualized and normal. The vertebrobasilar junction is normal. The basilar artery is normal. Both posterior cerebral arteries originate from the basilar tip. There is some irregularity of the proximal PCA vessels without significant proximal stenosis. Branch vessels are intact. Venous sinuses: The dural sinuses are patent. Anatomic variants: None Delayed phase: No pathologic enhancement is present. Infarcts are well-defined. Review of the MIP images confirms the above findings IMPRESSION: 1. High-grade bilateral proximal ICA stenoses at the carotid bifurcations. 2. 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 3. Hypoplastic left A1 segment. 4. High-grade stenosis of the anterior right M2 segment. 5. Moderate diffuse medium and distal small vessel disease in both the anterior and posterior circulations. 6. Expected evolution of left internal capsule nonhemorrhagic infarct. 7. 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 Electronically Signed   By: Marin Roberts M.D.   On: 02/28/2019 21:01   Dg Chest 2 View  Result Date: 02/25/2019 CLINICAL DATA:  Altered mental status today. EXAM: CHEST - 2 VIEW COMPARISON:  Single-view of the chest 07/16/2006. FINDINGS: Lungs clear. Heart size normal. No pneumothorax or pleural fluid. No acute or focal bony abnormality. IMPRESSION: Negative chest. Electronically Signed   By: Drusilla Kanner M.D.   On: 02/07/2019 16:00   Dg Knee 2 Views Right  Result Date: 02/10/2019 CLINICAL DATA:  Knee pain, initial encounter EXAM: RIGHT KNEE - 2 VIEW COMPARISON:  None. FINDINGS: Mild medial joint space narrowing is noted. No acute fracture or dislocation is seen. Mild patellofemoral spurring is noted as well. IMPRESSION: Mild degenerative change without acute abnormality. Electronically Signed   By: Alcide Clever M.D.   On: 02/17/2019 16:03   Ct Head Wo Contrast  Result Date: 03/05/2019 CLINICAL  DATA:  Altered level of consciousness EXAM: CT HEAD WITHOUT CONTRAST TECHNIQUE: Contiguous axial images were obtained from the base of the  skull through the vertex without intravenous contrast. COMPARISON:  None. FINDINGS: Brain: Mild atrophic changes are noted. Encephalomalacia changes are seen in the distribution of the right middle cerebral artery consistent with prior infarct. Rounded decreased area of attenuation is noted in the region of the internal capsule on the left suggestive of subacute to chronic ischemia. No focal area of acute infarct or acute hemorrhage is seen. No space-occupying mass lesion is noted. Vascular: No hyperdense vessel or unexpected calcification. Skull: Normal. Negative for fracture or focal lesion. Sinuses/Orbits: No acute finding. Other: None. IMPRESSION: Chronic atrophic changes. 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 Rounded somewhat elongated area of decreased attenuation on the left in the region of the internal capsule and basal ganglia consistent with subacute to chronic ischemia. No acute infarct is noted. Electronically Signed   By: Alcide Clever M.D.   On: 02/20/2019 16:06   Ct Angio Neck W Or Wo Contrast  Result Date: 02/28/2019 CLINICAL DATA:  Stroke follow-up. 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 EXAM: CT ANGIOGRAPHY HEAD AND NECK TECHNIQUE: Multidetector CT imaging of the head and neck was performed using the standard protocol during bolus administration of intravenous contrast. Multiplanar CT image reconstructions and MIPs were obtained to evaluate the vascular anatomy. Carotid stenosis measurements (when applicable) are obtained utilizing NASCET criteria, using the distal internal carotid diameter as the denominator. CONTRAST:  13mL ISOVUE-370 IOPAMIDOL (ISOVUE-370) INJECTION 76% COMPARISON:  MRI of the brain 02/19/2019 FINDINGS: CT HEAD FINDINGS Brain: The left internal capsule infarct is again  noted, now slightly lower density than on the previous CT. No new infarct is present. The remote right MCA territory encephalomalacia is stable. The ventricles are of proportionate to the degree of atrophy. No significant extraaxial fluid collection is present. The brainstem and cerebellum are within normal limits. Vascular: Atherosclerotic calcifications are present within the cavernous internal carotid arteries bilaterally. There is no hyperdense vessel. Skull: The craniocervical junction is normal. Upper cervical spine is within normal limits. Marrow signal is unremarkable. Sinuses: Scattered opacification of left ethmoid air cells are present. There are no fluid levels. The paranasal sinuses and mastoid air cells are otherwise clear. Orbits: The globes and orbits are within normal limits. Review of the MIP images confirms the above findings CTA NECK FINDINGS Aortic arch: A 3 vessel arch configuration is present. Minimal atherosclerotic changes are present at the great vessel origins. There is no significant stenosis or aneurysm. Right carotid system: The right common carotid artery is within normal limits. Dense calcifications are present at the right carotid bifurcation. There is a high-grade stenosis at the carotid bifurcation. Lumen is narrowed to less than 1 mm. The more distal right common carotid artery is within normal limits to the skull base. Left carotid system: The left common carotid artery demonstrates some atherosclerotic irregularity. There is a high-grade, near occlusive stenosis of the left internal carotid artery at its bifurcation. The cervical left ICA is otherwise normal. Vertebral arteries: Extensive atherosclerotic calcifications are present along the vertebral arteries bilaterally. The right vertebral artery is dominant. There is a high-grade stenosis at the proximal left vertebral artery. No significant stenosis is present in the right vertebral artery. The left vertebral artery is  reconstituted at the distal V1 segment. Extensive atherosclerotic changes are present throughout the V2 segment. These are high-grade stenosis at the level of C1. Skeleton: Vertebral body heights alignment are maintained. No focal lytic or blastic lesions are present. Other neck: The soft tissues the  neck are otherwise unremarkable. No focal mucosal or submucosal lesions are present. Salivary glands are within normal limits. No significant adenopathy is present. Thyroid is normal. Upper chest: Next mild dependent atelectasis is present. The lung apices are otherwise clear. Thoracic inlet is within normal limits. Review of the MIP images confirms the above findings CTA HEAD FINDINGS Anterior circulation: Atherosclerotic calcifications are present within the cavernous internal carotid arteries bilaterally without a significant stenosis through the ICA termini. The left A1 is hypoplastic. The right A1 is normal. The anterior communicating artery is patent. ACA branch vessels are within normal limits bilaterally. There is a high-grade stenosis of the anterior right M2 segment with marked attenuation of distal branches. Diffuse irregularity present and more posterior left MCA branches. There is moderate irregularity in left MCA branches. Pial collaterals are evident. Posterior circulation: The right vertebral artery is the dominant vessel. Segmental irregularity is present in the left V4 segment without a significant stenosis. PICA origins are visualized and normal. The vertebrobasilar junction is normal. The basilar artery is normal. Both posterior cerebral arteries originate from the basilar tip. There is some irregularity of the proximal PCA vessels without significant proximal stenosis. Branch vessels are intact. Venous sinuses: The dural sinuses are patent. Anatomic variants: None Delayed phase: No pathologic enhancement is present. Infarcts are well-defined. Review of the MIP images confirms the above findings  IMPRESSION: 1. High-grade bilateral proximal ICA stenoses at the carotid bifurcations. 2. 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 3. Hypoplastic left A1 segment. 4. High-grade stenosis of the anterior right M2 segment. 5. Moderate diffuse medium and distal small vessel disease in both the anterior and posterior circulations. 6. Expected evolution of left internal capsule nonhemorrhagic infarct. 7. 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 Electronically Signed   By: Marin Roberts M.D.   On: 02/28/2019 21:01   [name]  Result Date: 28-Mar-2019 CLINICAL DATA:  Focal neuro deficit for greater than 6 hours. Altered level of consciousness. EXAM: MRI HEAD WITHOUT CONTRAST TECHNIQUE: Multiplanar, multiecho pulse sequences of the brain and surrounding structures were obtained without intravenous contrast. COMPARISON:  CT head without contrast 01/29/2019 FINDINGS: Brain: The diffusion-weighted images confirm an acute nonhemorrhagic infarct involving the genu of the left internal capsule and globus pallidus. T2 signal changes are associated with the acute infarct, consistent with the subacute time frame. Remote encephalomalacia is again noted right MCA territory. The ventricles are of proportionate to the degree of atrophy. Wallerian degeneration is present in the right cerebral peduncle extending into the pons. Cerebellum is normal. No significant extraaxial fluid collection is present. Vascular: Flow is present in the major intracranial arteries. Skull and upper cervical spine: The craniocervical junction is normal. Upper cervical spine is within normal limits. Marrow signal is unremarkable. Sinuses/Orbits: The paranasal sinuses and mastoid air cells are clear. Bilateral lens replacements are noted. The globes and orbits are within normal limits. IMPRESSION: 1. Acute/subacute nonhemorrhagic infarct involving the left  internal capsule and basal ganglia. This is consistent with a time frame of greater than 6 hours. 2. 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 Electronically Signed   By: Marin Roberts M.D.   On: 03/28/2019 19:18   Dg Foot Complete Right  Result Date: Mar 28, 2019 CLINICAL DATA:  Right foot pain EXAM: RIGHT FOOT COMPLETE - 3+ VIEW COMPARISON:  None. FINDINGS: Comminuted fracture is noted at the base of the first distal phalanx. This  extends into the articular surface in multiple locations. No other fracture is seen. Soft tissue swelling is noted. IMPRESSION: Comminuted fracture of the first distal phalanx which extends to the articular surface. Electronically Signed   By: Alcide Clever M.D.   On: 02/09/2019 16:01   Vas [place] Vanice Sarah With/wo Tbi  Result Date: 03/01/2019 LOWER EXTREMITY DOPPLER STUDY Indications: Gangrene. High Risk Factors: Current smoker.  Comparison Study: No prior study on file Performing Technologist: Sherren Kerns RVS  Examination Guidelines: A complete evaluation includes at minimum, Doppler waveform signals and systolic blood pressure reading at the level of bilateral brachial, anterior tibial, and posterior tibial arteries, when vessel segments are accessible. Bilateral testing is considered an integral part of a complete examination. Photoelectric Plethysmograph (PPG) waveforms and toe systolic pressure readings are included as required and additional duplex testing as needed. Limited examinations for reoccurring indications may be performed as noted.  ABI Findings: +---------+------------------+-----+-------------------+-----------------------+  Right     Rt Pressure (mmHg) Index Waveform            Comment                  +---------+------------------+-----+-------------------+-----------------------+  Brachial  183                      triphasic                                     +---------+------------------+-----+-------------------+-----------------------+  PTA       67                 0.37  dampened monophasic                          +---------+------------------+-----+-------------------+-----------------------+  DP        74                 0.40  dampened monophasic                          +---------+------------------+-----+-------------------+-----------------------+  Great Toe                                              Not done secondary to                                                            gangrene                 +---------+------------------+-----+-------------------+-----------------------+ +--------+------------------+-----+---------+-------+  Left     Lt Pressure (mmHg) Index Waveform  Comment  +--------+------------------+-----+---------+-------+  Brachial 171                      triphasic          +--------+------------------+-----+---------+-------+  PTA      185                1.01  biphasic           +--------+------------------+-----+---------+-------+  DP       176  0.96  biphasic           +--------+------------------+-----+---------+-------+ +-------+-----------+-----------+------------+------------+  ABI/TBI Today's ABI Today's TBI Previous ABI Previous TBI  +-------+-----------+-----------+------------+------------+  Right   0.40                                               +-------+-----------+-----------+------------+------------+  Left    1.01                                               +-------+-----------+-----------+------------+------------+  Summary: Right: 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 Left: Resting left ankle-brachial index is within normal range. No evidence of significant left lower extremity arterial disease.  *See table(s) above for measurements and observations.  Electronically signed by Coral Else MD on 03/01/2019 at 10:49:49 AM.    Final    Vas US  Carotid  Result Date: 02/25/2019 Carotid Arterial Duplex Study Indications:                           ICA stenosis. Limitations:                           Technically difficult patient due                                        movement, cooperation, and positioning. Pre-Surgical Evaluation & Surgical     Stenosis at bifurcation only. ICA is not Correlation:                           normal past stenosis. Bifurcation is                                        located near the [place]. Right sided                                        80 to 99% ICA stenosis originates within                                        the bifurcation and extends approximately                                        1.1 cm into the proximal ICA. There is no                                        apparent hemodynamically significant  stenosis involving the mid or distal ICA.                                        Left sided 80 to 99% ICA stenosis                                        originates within the bifurcation and                                        extends approximately 1.1 cm into the                                        proximal ICA. There is no apparent                                        hemodynamically significant stenosis                                        involving the mid or distal ICA. Performing Technologist: Chanda Busing RVT  Examination Guidelines: A complete evaluation includes B-mode imaging, spectral Doppler, color Doppler, and power Doppler as needed of all accessible portions of each vessel. Bilateral testing is considered an integral part of a complete examination. Limited examinations for reoccurring indications may be performed as noted.  Right Carotid Findings: +----------+--------+--------+--------+--------+--------+             PSV cm/s EDV cm/s Stenosis Describe Comments  +----------+--------+--------+--------+--------+--------+  CCA Distal 63        16                                   +----------+--------+--------+--------+--------+--------+  ICA Prox   397      115      80-99%                      +----------+--------+--------+--------+--------+--------+  ICA Distal 85       31                                   +----------+--------+--------+--------+--------+--------+  Left Carotid Findings: +----------+--------+--------+--------+--------+--------+             PSV cm/s EDV cm/s Stenosis Describe Comments  +----------+--------+--------+--------+--------+--------+  CCA Distal 36       9                                    +----------+--------+--------+--------+--------+--------+  ICA Prox   358      123      80-99%                      +----------+--------+--------+--------+--------+--------+  ICA Distal  106      29                                   +----------+--------+--------+--------+--------+--------+  Summary: Right Carotid: Right sided 80 to 99% ICA stenosis originates within the                bifurcation and extends approximately 1.1 cm into the proximal                ICA. There is no apparent hemodynamically significant stenosis                involving the mid or distal ICA. Left Carotid: Left sided 80 to 99% ICA stenosis originates within the               bifurcation and extends approximately 1.1 cm into the proximal               ICA. There is no apparent hemodynamically significant stenosis               involving the mid or distal ICA.  *See table(s) above for measurements and observations.  Electronically signed by Coral Else MD on 02/15/2019 at 1:35:19 PM.    Final    US Abdomen Limited Ruq  Result Date: 03/01/2019 CLINICAL DATA:  Elevated LFTs. EXAM: ULTRASOUND ABDOMEN LIMITED RIGHT UPPER QUADRANT COMPARISON:  None. FINDINGS: Gallbladder: Physiologically distended. No gallstones or wall thickening visualized. No sonographic Murphy sign noted by sonographer. Common bile duct: Diameter: 6 mm, normal. Liver: No focal lesion identified.  Diffusely increased and heterogeneous in parenchymal echogenicity. Portal vein is patent on color Doppler imaging with normal direction of blood flow towards the liver. IMPRESSION: 1. Hepatic steatosis. 2. Normal sonographic appearance of gallbladder and biliary tree. Electronically Signed   By: Narda Rutherford M.D.   On: 03/01/2019 23:36    Lab Data:  CBC: Recent Labs  Lab 03/04/2019 1450 02/28/19 0338 03/01/19 0348 03/01/2019 0337 03/06/2019 0316 03/04/19 0233  WBC 14.6* 13.9* 12.4* 12.9* 12.6* 15.2*  NEUTROABS 10.9*  --  7.7  --   --   --   HGB 18.4* 16.8 15.5 16.9 13.8 13.3  HCT 55.4* 49.3 46.9 48.2 41.4 38.1*  MCV 88.4 88.5 89.2 86.5 87.7 87.6  PLT 314 274 259 219 253 234   Basic Metabolic Panel: Recent Labs  Lab 02/28/19 0338 03/01/19 0348 02/22/2019 0337 03/01/2019 0316 03/04/19 0233  NA 141 140 136 134* 133*  K 3.8 3.5 3.8 3.2* 3.5  CL 105 103 103 103 104  CO2 23 25 23 23 22   GLUCOSE 213* 123* 143* 156* 141*  BUN 25* 16 13 14 11   CREATININE 1.15 0.99 0.89 1.05 0.92  CALCIUM 8.9 8.6* 8.8* 8.1* 8.2*   GFR: Estimated Creatinine Clearance: 109.4 mL/min (by C-G formula based on SCr of 0.92 mg/dL). Liver Function Tests: Recent Labs  Lab 03/04/2019 1450 02/28/19 0338 03/01/19 0348 02/25/2019 0316 03/04/19 0233  AST 136* 112* 97* 59* 42*  ALT 69* 67* 65* 62* 58*  ALKPHOS 98 79 75 75 62  BILITOT 1.5* 0.9 0.6 0.5 0.6  PROT 7.5 6.2* 6.1* 5.3* 5.3*  ALBUMIN 3.7 3.1* 2.9* 2.4* 2.2*   Recent Labs  Lab 02/24/2019 1450  LIPASE 36   Recent Labs  Lab 03/06/2019 1519  AMMONIA 25   Coagulation Profile: No results for input(s): INR, PROTIME in the last 168 hours. Cardiac  Enzymes: Recent Labs  Lab 03-19-19 1450 Mar 19, 2019 2235 02/28/19 0338 02/28/19 1004 03/01/19 0348  CKTOTAL 3,323*  --  2,223*  --  1,051*  TROPONINI 0.03* <0.03 0.06* 0.03*  --    BNP (last 3 results) No results for input(s): PROBNP in the last 8760 hours. HbA1C: No results for input(s): HGBA1C in the  last 72 hours. CBG: Recent Labs  Lab 03/08/2019 0741 02/17/2019 1125 02/17/2019 1612 02/15/2019 2119 03/04/19 0602  GLUCAP 139* 129* 249* 140* 150*   Lipid Profile: No results for input(s): CHOL, HDL, LDLCALC, TRIG, CHOLHDL, LDLDIRECT in the last 72 hours. Thyroid Function Tests: No results for input(s): TSH, T4TOTAL, FREET4, T3FREE, THYROIDAB in the last 72 hours. Anemia Panel: No results for input(s): VITAMINB12, FOLATE, FERRITIN, TIBC, IRON, RETICCTPCT in the last 72 hours. Urine analysis:    Component Value Date/Time   COLORURINE YELLOW 03/19/2019 1604   APPEARANCEUR CLEAR 2019-03-19 1604   LABSPEC 1.026 Mar 19, 2019 1604   PHURINE 5.0 03-19-19 1604   GLUCOSEU 150 (A) 03/19/19 1604   HGBUR SMALL (A) Mar 19, 2019 1604   BILIRUBINUR NEGATIVE Mar 19, 2019 1604   KETONESUR 20 (A) 19-Mar-2019 1604   PROTEINUR NEGATIVE March 19, 2019 1604   NITRITE NEGATIVE 03/19/19 1604   LEUKOCYTESUR NEGATIVE 2019-03-19 1604     Daleena Rotter M.D. [facility] 03/04/2019, 10:40 AM  Pager: [phone] Between 7am to 7pm - call Pager - [phone]  After 7pm go to [email or web address] - password [name]  Call night coverage person covering after 7pm

## 2019-03-04 NOTE — Progress Notes (Signed)
Patient and his brother met with palliative care this evening to establish goals of care.  Ultimately it was decided that patient does not want any more invasive procedures.  I discussed with the patient's brother earlier today that we have very treatable options including a TCAR with carotid stenting of his symptomatic left carotid stenosis and then we would address his right lower extremity critical limb ischemia with a iliac thrombectomy likely common iliac stent and then a femoral to above-knee popliteal bypass.  Discussed doing these in stages and addressing his carotid first given that seems to be higher risk.  Ultimately patient's brother and medical decision maker demonstrates good understanding and states that after discussing with palliative care he would not any want any more invasive procedures.  Discussed with no intervention will be at risk for another neurologic event that could be significant and debilitating as well as do not expect that he will heal his right toe amputation and this will likely become necrotic and possibly gangrenous and infected over time.  Please call vascular surgery if patient or family change mind moving forward.  We will take him off the schedule for Monday for planned TCAR and sign off.   J. , MD [facility] [place] Office: [phone] Pager: [phone]   J   

## 2019-03-04 NOTE — Progress Notes (Signed)
Civil engineer, contracting [facility])   Received request from Nelly Laurence, for residential hospice.  Family requested [facility].  Pt and chart under review by [facility] MD and eligibility is pending at this time.  Spoke with brother to confirm request.  All questions answered currently.  He is aware that we will follow up once bed availability has been determined.  Thank you, Wallis Bamberg RN, BSN, CCRN [facility] Liaison (listed in [place]) [phone]

## 2019-03-04 NOTE — Progress Notes (Addendum)
  Progress Note    03/04/2019 7:52 AM 1 Day Post-Op  Subjective:  No complaints  Tm 99.6 now afebrile   Vitals:   03/04/19 0000 03/04/19 0400  BP:  (!) 151/85  Pulse:  87  Resp:  17  Temp:  98.3 F (36.8 C)  SpO2: (!) 86% 91%    Physical Exam: General:  No distress Lungs:  Non labored Incisions:  Left groin is soft without hematoma Extremities:  Right foot with coban wrap and left foot in boot Neuro:  Bilateral grips are equal; smile symmetrical; moving BLE equally  CBC    Component Value Date/Time   WBC 15.2 (H) 03/04/2019 0233   RBC 4.35 03/04/2019 0233   HGB 13.3 03/04/2019 0233   HCT 38.1 (L) 03/04/2019 0233   PLT 234 03/04/2019 0233   MCV 87.6 03/04/2019 0233   MCH 30.6 03/04/2019 0233   MCHC 34.9 03/04/2019 0233   RDW 12.7 03/04/2019 0233   LYMPHSABS 2.6 03/01/2019 0348   MONOABS 1.6 (H) 03/01/2019 0348   EOSABS 0.2 03/01/2019 0348   BASOSABS 0.1 03/01/2019 0348    BMET    Component Value Date/Time   NA 133 (L) 03/04/2019 0233   K 3.5 03/04/2019 0233   CL 104 03/04/2019 0233   CO2 22 03/04/2019 0233   GLUCOSE 141 (H) 03/04/2019 0233   BUN 11 03/04/2019 0233   CREATININE 0.92 03/04/2019 0233   CALCIUM 8.2 (L) 03/04/2019 0233   GFRNONAA >60 03/04/2019 0233   GFRAA >60 03/04/2019 0233    INR No results found for: INR   Intake/Output Summary (Last 24 hours) at 03/04/2019 0752 Last data filed at 03/04/2019 0600 Gross per 24 hour  Intake 1643.39 ml  Output 1440 ml  Net 203.39 ml     Assessment:  58 y.o. male is s/p:  1.  Ultrasound-guided access of the left common femoral artery 2.  Aortogram 3.  Bilateral lower extremity arteriogram with runoff 4.  Mynx closure of the left common femoral artery  1 Day Post-Op  Plan: -pt doing well this am-left groin is soft without hematoma -pt scheduled for left TCAR on Monday for symptomatic left high grade stenosis.  Will eventually need right iliac thrombectomy, right iliac stent and right fem AK  popliteal bypass after carotid procedure next week.  He is at high risk for limb loss.  Dr. Chestine Spore did discuss with pt's brother.   -DVT prophylaxis:  Heparin gtt started.    Doreatha Massed, PA-C [facility] [phone] 03/04/2019 7:52 AM  I have seen and evaluated the patient. I agree with the PA note as documented above. CLI of RLE with tissue loss and symptomatic left carotid stenosis.  Will address carotid first on Monday with TCAR.  Right leg after carotid - high risk for limb loss.  Heparin gtt in interim for thrombus in right iliac - has right femoral pulse on exam.  Cephus Shelling, MD [facility] [place] Office: [phone] Pager: [phone]

## 2019-03-04 NOTE — Progress Notes (Signed)
Daily Progress Note   Patient Name: Noah Thomas       Date: 03/04/2019 DOB: Jun 05, 1961  Age: 58 y.o. MRN#: [id number] Attending Physician: Mendel Corning, MD Primary Care Physician: Patient, No Pcp Per Admit Date: 03/05/2019  Reason for Consultation/Follow-up: Establishing goals of care  Subjective: I conversed with Dellis Filbert and then the two brothers together over conference call.  Saturnino is having a good day today.  He is sitting up in the chair and speaking pleasantly.  He seems coherent.  I talked with him about having another surgical procedure.  He stated clearly that he did not want it.  I explained that if he did not want the surgical procedure the infection in his leg would eventually overwhelm him and take his life.  I asked him to repeat to me what he heard me say.  He stated "if I do not have the surgery my leg will get much worse."  I met with his brother outside and we called Catrell's room.  Clair Gulling asked Bertil very direct questions about whether to have surgery or simply treat the pain.  Clair Gulling made certain that Eulis understood the consequences.  Teryn very clearly said he did not want the procedure.  We then talked about discharge from the hospital.  Trueman stated that he wanted to go home.  He felt that he would be okay if he just got home.   Clair Gulling explained that [facility] is no longer available as it has been condemned.  He explained to Winton that he will go to a nursing facility where people will be available to care for him.  Keil stated that he understood.  After our conversation Clair Gulling completed a MOST form.  He would like for Aleksey to be made as comfortable as possible.  He is a DNR.  No antibiotics.  No IV fluids.  No feeding tube.  Not to return to the hospital  unless he cannot be made comfortable at the nursing facility.  Clair Gulling and I talked at length about a comfort route (with no anticoagulation, antibiotics, IV fluids, surgical procedures) shortening Hyun's life.  Jim with deep sincerity talked about his parents looking down on him from heaven.  He feels that he must respect his brother's wishes and care for him as well as possible, but still  attempt to respect his personal feelings with regard to medical care.   Assessment: 58 year old patient with severe peripheral vascular disease and cerebrovascular disease as well as mental illness and imaging consistent with dementia.  Currently he is sitting up in a recliner chair, pleasant and without pain.  Patient and family have decided for comfort measures only.   Patient Profile/HPI:  58 y.o. male  with past medical history of mental health disorder and minimal medical care in the past, who was admitted on 02/22/2019 with sepsis and acute CVA.  He was found to have osteomyelitis of his great right toe and underwent amputation.  Subsequent imaging studies have showed significant cerebrovascular disease as well as peripheral vascular disease.  His brain imaging also showed wallerian degeneration consistent with dementia.  We were asked to consult for goals of care.    Length of Stay: 5  Current Medications: Scheduled Meds:  . aspirin EC  81 mg Oral Daily  . clopidogrel  300 mg Oral NOW  . [START ON 03/05/2019] clopidogrel  75 mg Oral Daily  . docusate sodium  100 mg Oral BID  . Influenza vac split quadrivalent PF  0.5 mL Intramuscular Tomorrow-1000  . nicotine  21 mg Transdermal Daily  . pentoxifylline  400 mg Oral TID WC  . pneumococcal 23 valent vaccine  0.5 mL Intramuscular Tomorrow-1000  . risperiDONE  0.5 mg Oral BID  . senna-docusate  2 tablet Oral QHS  . sodium chloride flush  3 mL Intravenous Q12H  . sodium chloride flush  3 mL Intravenous Q12H    Continuous Infusions: . sodium chloride  50 mL/hr at 02/19/2019 0300  . sodium chloride    . methocarbamol (ROBAXIN) IV      PRN Meds: sodium chloride, acetaminophen **OR** acetaminophen, bisacodyl, hydrALAZINE, HYDROmorphone (DILAUDID) injection, labetalol, magnesium citrate, methocarbamol **OR** methocarbamol (ROBAXIN) IV, ondansetron **OR** ondansetron (ZOFRAN) IV, oxyCODONE, oxyCODONE, polyethylene glycol, sodium chloride flush, sodium chloride flush  Physical Exam        Well-developed male obese, matted hair, sitting up in a recliner chair Awake alert with clear speech, appropriate.  Vital Signs: BP (!) 155/81 (BP Location: Left Arm)   Pulse 94   Temp 98.3 F (36.8 C) (Oral)   Resp 18   Ht 6' (1.829 m)   Wt 104.5 kg   SpO2 94%   BMI 31.25 kg/m  SpO2: SpO2: 94 % O2 Device: O2 Device: Room Air O2 Flow Rate: O2 Flow Rate (L/min): 2 L/min  Intake/output summary:   Intake/Output Summary (Last 24 hours) at 03/04/2019 1319 Last data filed at 03/04/2019 1157 Gross per 24 hour  Intake 1883.39 ml  Output 1440 ml  Net 443.39 ml   LBM: Last BM Date: (PTA) Baseline Weight: Weight: 81.6 kg Most recent weight: Weight: 104.5 kg       Palliative Assessment/Data: 40%      Patient Active Problem List   Diagnosis Date Noted  . Paranoia ([place])   . DNR (do not resuscitate)   . Palliative care encounter   . Chronic mental illness 03/04/2019  . Right great toe amputee ([place])   . Cerebral thrombosis with cerebral infarction 03/01/2019  . New onset type 2 diabetes mellitus ([place]) 03/01/2019  . Internal carotid artery stenosis 03/01/2019  . Acute lacunar stroke ([place]) 03/01/2019  . Abnormal LFTs   . Osteomyelitis of great toe of right foot ([place])   . Gangrene of toe of right foot ([place])   . Moderate protein-calorie malnutrition ([place])   .  Smoking   . Acute metabolic encephalopathy 96/28/3662  . Dehydration 02/26/2019  . Cellulitis 02/23/2019  . Adult failure to thrive 02/26/2019    Palliative Care Plan     Recommendations/Plan:  Patient and family have opted for comfort measures only, DNR  M OST form completed: DNR, comfort measures, no IV antibiotics, no IV fluids, no PEG  Engage palliative care at time of discharge.  Patient to be transferred to hospice care as soon as appropriate.  Palliative care will continue to check on the patient while he is in the hospital as his brother requested that we ensure his comfort.  Goals of Care and Additional Recommendations:  Limitations on Scope of Treatment: Avoid Hospitalization, Full Comfort Care, Minimize Medications, No Artificial Feeding, No Blood Transfusions, No Glucose Monitoring, No Hemodialysis, No IV Antibiotics, No IV Fluids, No Lab Draws and No Surgical Procedures  Code Status:  DNR  Prognosis:  < 6 months secondary to right lower limb ischemia, and a desire not to treat the infection in his limb.  Comfort measures only.  He is eligible for hospice services.  Discharge Planning:  To Be Determined  Family is unable to care for him at home.  His only living relative is his brother who works full time.  Anticipate discharge to a nursing facility.    Care plan was discussed with patient, brother, attending MD.  Thank you for allowing the Palliative Medicine Team to assist in the care of this patient.  Total time spent:  60 min.     Greater than 50%  of this time was spent counseling and coordinating care related to the above assessment and plan.  Florentina Jenny, PA-C Palliative Medicine  Please contact Palliative MedicineTeam phone at [phone] for questions and concerns between 7 am - 7 pm.   Please see AMION for individual provider pager numbers.

## 2019-03-04 NOTE — TOC Progression Note (Signed)
Transition of Care [facility]) - Progression Note    Patient Details  Name: DWAYN VANHOOSER MRN: [id number] Date of Birth: 1961/07/21  Transition of Care [facility]) CM/SW Contact  Eduard Roux, [place] Phone Number: 03/04/2019, 4:05 PM  Clinical Narrative:     CSW made residential hospice referral to [facility] with [facility]. CSW advised, patient has no insurance.They will review and call CSW back with decision.   Expected Discharge Plan: Skilled Nursing Facility Barriers to Discharge: Continued Medical Work up  Expected Discharge Plan and Services Expected Discharge Plan: Skilled Nursing Facility In-house Referral: Clinical Social Work Discharge Planning Services: NA   Living arrangements for the past 2 months: Mobile Home                 DME Arranged: N/A DME Agency: NA HH Arranged: NA HH Agency: NA   Social Determinants of Health (SDOH) Interventions    Readmission Risk Interventions No flowsheet data found.

## 2019-03-04 NOTE — TOC Progression Note (Signed)
Transition of Care [facility]) - Progression Note    Patient Details  Name: Noah Thomas MRN: [id number] Date of Birth: 1961/03/14  Transition of Care [facility]) CM/SW Contact  Eduard Roux, [place] Phone Number: 03/04/2019, 2:19 PM  Clinical Narrative:    Patient has no insurance and will need Letter of Guarantee(LOG) approval and bed offer from SNF. Patient currently has no bed offers. Patient has been declined by [facility] and [facility]. [facility] and [address] will review and make determination.   CSW has made request for CSW Assistance Director to review for LOG.       Expected Discharge Plan: Skilled Nursing Facility Barriers to Discharge: Continued Medical Work up  Expected Discharge Plan and Services Expected Discharge Plan: Skilled Nursing Facility In-house Referral: Clinical Social Work Discharge Planning Services: NA   Living arrangements for the past 2 months: Mobile Home                 DME Arranged: N/A DME Agency: NA HH Arranged: NA HH Agency: NA   Social Determinants of Health (SDOH) Interventions    Readmission Risk Interventions No flowsheet data found.

## 2019-03-04 NOTE — Progress Notes (Signed)
Received to room 6n25 from 4E at ths time.

## 2019-03-04 NOTE — Progress Notes (Signed)
ANTICOAGULATION CONSULT NOTE - Initial Consult  Pharmacy Consult for heparin Indication: critical limb ischemia  No Known Allergies  Patient Measurements: Height: 6' (182.9 cm) Weight: 230 lb 6.1 oz (104.5 kg) IBW/kg (Calculated) : 77.6 Heparin Dosing Weight: 100kg  Vital Signs: Temp: 98.3 F (36.8 C) (03/26 0400) Temp Source: Oral (03/26 0400) BP: 151/85 (03/26 0400) Pulse Rate: 87 (03/26 0400)  Labs: Recent Labs    03/09/2019 0337 02/20/2019 0316 03/04/19 0233  HGB 16.9 13.8 13.3  HCT 48.2 41.4 38.1*  PLT 219 253 234  CREATININE 0.89 1.05 0.92    Estimated Creatinine Clearance: 109.4 mL/min (by C-G formula based on SCr of 0.92 mg/dL).   Medical History: Past Medical History:  Diagnosis Date  . Osteomyelitis (HCC) 02/2019   RIGHT GREAT TOE    Medications:  Medications Prior to Admission  Medication Sig Dispense Refill Last Dose  . acetaminophen (TYLENOL) 325 MG tablet Take 975 mg by mouth every 6 (six) hours as needed for mild pain, fever or headache.   unk   Scheduled:  . aspirin EC  81 mg Oral Daily  . docusate sodium  100 mg Oral BID  . folic acid  1 mg Oral Daily  . Influenza vac split quadrivalent PF  0.5 mL Intramuscular Tomorrow-1000  . insulin aspart  0-9 Units Subcutaneous TID WC  . multivitamin with minerals  1 tablet Oral Daily  . nicotine  21 mg Transdermal Daily  . pentoxifylline  400 mg Oral TID WC  . pneumococcal 23 valent vaccine  0.5 mL Intramuscular Tomorrow-1000  . risperiDONE  0.5 mg Oral BID  . sodium chloride flush  3 mL Intravenous Q12H  . sodium chloride flush  3 mL Intravenous Q12H  . thiamine  100 mg Oral Daily   Infusions:  . sodium chloride 50 mL/hr at 03/08/2019 0300  . sodium chloride    . methocarbamol (ROBAXIN) IV      Assessment: 58yo male without  admitted w/ multiple problems, now s/p toe amputation, evaluated by vascular surgery and found to have critical limb ischemia and at high risk for limb loss, to start heparin  gtt.  Goal of Therapy:  Heparin level 0.3-0.7 units/ml Monitor platelets by anticoagulation protocol: Yes   Plan:  Will give heparin 5000 units IV bolus followed by gtt at 1600 units/hr and monitor heparin levels and CBC.  Vernard Gambles, PharmD, BCPS  03/04/2019,6:33 AM

## 2019-03-05 MED ORDER — HYDRALAZINE HCL 25 MG PO TABS
25.0000 mg | ORAL_TABLET | Freq: Four times a day (QID) | ORAL | Status: DC | PRN
  Administered 2019-03-06: 25 mg via ORAL
  Filled 2019-03-05: qty 1

## 2019-03-05 NOTE — TOC Progression Note (Signed)
Transition of Care [facility]) - Progression Note    Patient Details  Name: Noah Thomas MRN: [id number] Date of Birth: January 13, 1961  Transition of [facility]) CM/SW Contact  Doy Hutching, [place] Phone Number: 03/05/2019, 1:38 PM  Clinical Narrative:    Pt still without placement offers: have reached out to [facility] and [place], [address] and [address] and [employer].    Expected Discharge Plan: Skilled Nursing Facility Barriers to Discharge: Continued Medical Work up  Expected Discharge Plan and Services Expected Discharge Plan: Skilled Nursing Facility In-house Referral: Clinical Social Work Discharge Planning Services: NA   Living arrangements for the past 2 months: Mobile Home                 DME Arranged: N/A DME Agency: NA HH Arranged: NA HH Agency: NA   Social Determinants of Health (SDOH) Interventions    Readmission Risk Interventions No flowsheet data found.

## 2019-03-05 NOTE — Progress Notes (Addendum)
[facility]                                                                              Patient Demographics  Noah Thomas, is a 58 y.o. male, DOB - 1961/10/11, ZOX:[id number]  Admit date - 03/09/2019   Admitting Physician Elease Etienne, MD  Outpatient Primary MD for the patient is Patient, No Pcp Per  Outpatient specialists:   LOS - 6  days   Medical records reviewed and are as summarized below:    Chief Complaint  Patient presents with   Altered Mental Status       Brief summary   :58 year old male, lives alone in a mobile home and independent, has not seen a physician in a long time, PMH of significant longstanding mental health issues, not on prescription medications, tobacco abuse, presented to [facility] ED on 03/04/2019 via EMS with altered mental status. History obtained from brother. Brother indicated that patient was normal until his teenage years when he started becoming paranoid and agitated while living with his parents, eventually had to [facility]. Over the years patient has calmed down, able to go to the grocery store for food, cigarettes but has no insight and unable to truly care for himself. He lives in [place] conditions to an extent that when EMS went to retrieve him today, authorities were planning to "condemn" his trailer home. Patient's brother has given him a debit card and provides with the finances. Patient last visited his brother on [birth year] his birthday, at which point he was in his usual state of health except he complained of some toe pain but was managing to ambulate. Since then the brother was trying to reach him by phone for several days and went over to his house a day before the admission but did not get an answer at the door. EMS found the patient on the ground in filthy condition, confused. Patient is unable to provide any history.   ED Course:Noted to be in filthy condition, tachycardic, not febrile, not  hypotensive, leukocytosis, elevated lactate, right toe exam suggestive of possible cellulitis, initiated sepsis protocol and received IV fluids and broad-spectrum IV antibiotics. Orthopedics consulted by EDP and will see him in the morning. WBC 14.6, hemoglobin 18.4, glucose 211, BUN 33, AST 136, ALT 69, total bilirubin 1.5, CK 3000 323, troponin 0 0.03, urine microscopy not suggestive of UTI, chest x-ray negative, CT head suggests subacute to chronic ischemia, right foot x-ray showed comminuted fracture of the first distal phalanx.  Assessment & Plan    Principal Problem:   Right great toe gangrene/ abscess:  - patient was found to have comminuted fracture of the right first distal phalanx extending to the articular surface -Patient was placed on IV vancomycin and cefepime, stopped 24 hours postop per Ortho recommendations.   -Per Dr. Lajoyce Corners,  wound [facility] for 1 week, NWB on R foot -Ask for surgery was consulted, patient underwent bilateral lower extremity arteriogram on 3/25, will eventually need right iliac thrombectomy, right iliac stent and right femoral AKA popliteal bypass after the carotid procedure next week.  Patient was started on heparin drip. -  Palliative medicine was consulted for goals of care.  Per patient and his brother's (HPOA) wishes, he was transitioned to comfort care on 3/26.  Patient did not want any further invasive interventions.  Acute CVA/ bilat ICA stenosis: involving the left internal capsule and basal ganglia.  -CT head showed chronic atrophy, prior R MCA infarction with encephalomalacia, subacute to chronic left internal capsule/BG infarct -MRI of the brain showed acute/subacute infarct left internal capsule/BG.  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 Raynald Kemp, recommended aspirin 81 mg and Plavix 75 mg daily for 3 weeks and then  aspirin alone.   -Prior goals of care addressed on 3/26, patient is now comfort care.  Acute metabolic encephalopathy -Secondary to CVA, acute infection, dehydration, may have underlying psych illness -Per patient's brother, reportedly he had a mental illness since a teenager, was in but not at one point, as an adult never worked more than a week and lives alone.  Patient has been dependent on his family all his adult life for food enlarging. -Per psychiatry recommendations, EKG obtained, QTC improved.  Started on Risperdal -Now comfort care  Diabetes mellitus type 2 -New diagnosis, hemoglobin A1c 7.7.  Patient had not been following with doctors. -Patient was seen by diabetic coordinator, recommended metformin at the time of discharge. -On sliding scale insulin, currently comfort care  Transaminitis -Improving, right upper quadrant ultrasound showed fatty liver  Generalized weakness, acute CVA PT OT consulted, recommended skilled nursing facility  Rhabdomyolysis, acute, mild -Resolved with IV fluids  Goals of care Goals of care meeting on 3/26, management was addressed with patient and his brother by vascular surgery, myself and palliative medicine.  Patient has opted for no further invasive interventions and requested comfort care. If patient is at high risk of recurrent stroke, limb loss, worsening infection, death without invasive interventions, then likely poor prognosis and life expectancy probably less than 6 months, would qualify for hospice care. 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 03/01/19 1706 02/19/2019 0805   02/28/19 2100  vancomycin (VANCOCIN) 1,000 mg in sodium chloride 0.9 % 250 mL IVPB     1,000 mg 250 mL/hr over 60 Minutes Intravenous Every 12 hours 02/28/19 1707 02/18/2019 1159   02/28/19 0600  vancomycin (VANCOCIN) IVPB 1000 mg/200 mL premix  Status:  Discontinued     1,000 mg 200 mL/hr over 60 Minutes Intravenous Every 12 hours 02/23/2019 1708 02/28/19 1707   02/28/19 0600  ceFEPIme (MAXIPIME) 2 g in sodium chloride 0.9 % 100 mL IVPB     2 g 200 mL/hr over 30 Minutes Intravenous Every 12 hours 02/20/2019 1708 03/07/2019 0959   03/05/2019 1700  vancomycin (VANCOCIN) 1,500 mg in sodium chloride 0.9 % 500 mL IVPB     1,500 mg 250 mL/hr over 120 Minutes Intravenous  Once 02/12/2019 1613 02/14/2019 2250   02/23/2019 1615  ceFEPIme (MAXIPIME) 2 g in sodium chloride 0.9 % 100 mL IVPB     2 g 200 mL/hr over 30 Minutes Intravenous  Once 02/12/2019 1610 03/07/2019 1715   02/28/2019 1615  metroNIDAZOLE (FLAGYL) IVPB 500 mg     500 mg 100 mL/hr over 60 Minutes Intravenous  Once 02/15/2019 1610 02/25/2019 1845   02/15/2019 1615  vancomycin (VANCOCIN) IVPB 1000 mg/200 mL premix  Status:  Discontinued     1,000 mg 200 mL/hr over 60 Minutes Intravenous  Once 02/11/2019 1610 02/19/2019 1613         Medications  Scheduled Meds:  aspirin EC  81 mg Oral Daily   clopidogrel  300 mg Oral NOW   clopidogrel  75 mg Oral Daily   docusate sodium  100 mg Oral BID   Influenza vac  split quadrivalent PF  0.5 mL Intramuscular Tomorrow-1000   metoprolol tartrate  5 mg Intravenous Once   metoprolol tartrate  25 mg Oral BID   nicotine  21 mg Transdermal Daily   pentoxifylline  400 mg Oral TID WC   pneumococcal 23 valent vaccine  0.5 mL Intramuscular Tomorrow-1000   risperiDONE  0.5 mg Oral BID   senna-docusate  2 tablet Oral QHS   Continuous Infusions:  methocarbamol (ROBAXIN) IV     PRN Meds:.acetaminophen **OR** acetaminophen, bisacodyl, hydrALAZINE, HYDROmorphone (DILAUDID) injection, labetalol, magnesium citrate, methocarbamol **OR** methocarbamol (ROBAXIN) IV, ondansetron **OR** ondansetron (ZOFRAN) IV, oxyCODONE, oxyCODONE, polyethylene glycol      Subjective:   Noah Thomas was seen and examined today.  Appears comfortable, denies any specific complaints.  No fevers, no acute issues overnight.  Patient denies dizziness, chest pain, shortness of breath, abdominal pain, N/V/D/C, new weakness, numbess, tingling.   Objective:   Vitals:   03/04/19 1239 03/04/19 1704 03/04/19 2059 03/05/19 0432  BP: (!) 155/81 138/77 (!) 175/94 (!) 158/100  Pulse: 94 90 89 90  Resp:  18 16 16   Temp: 98.3 F (36.8 C) 100.3 F (37.9 C) 98.4 F (36.9 C) 98.3 F (36.8 C)  TempSrc: Oral Axillary Oral Oral  SpO2: 94% 97% 97% 99%  Weight:  101.5 kg    Height:  6' (1.829 m)      Intake/Output Summary (Last 24 hours) at 03/05/2019 0953 Last data filed at 03/04/2019 2202 Gross per 24 hour  Intake 391.19 ml  Output 600 ml  Net -208.81 ml     Wt Readings from Last 3 Encounters:  03/04/19 101.5 kg   Physical Exam  General: Alert and oriented x 3, NAD  Eyes:  HEENT:  Atraumatic, normocephalic  Cardiovascular: S1 S2 clear, RRR. No pedal edema b/l  Respiratory: CTAB  Gastrointestinal: Soft,NT, ND, NBS  Ext: no pedal edema bilaterally  Neuro:   Musculoskeletal:   Skin: Right foot dressing intact  Psych: Normal affect and demeanor, alert and oriented  x3     Data Reviewed:  I have personally reviewed following labs and imaging studies  Micro Results Recent Results (from the past 240 hour(s))  Urine culture     Status: None   Collection Time:  02/21/2019  4:04 PM  Result Value Ref Range Status   Specimen Description URINE, CATHETERIZED  Final   Special Requests Normal  Final   Culture   Final    NO GROWTH Performed at [facility], [address] [place], [place] [address]    Report Status 02/28/2019 FINAL  Final  Surgical pcr screen     Status: None   Collection Time: 03/01/19  5:15 PM  Result Value Ref Range Status   MRSA, PCR NEGATIVE NEGATIVE Final   Staphylococcus aureus NEGATIVE NEGATIVE Final    Comment: (NOTE) The Xpert SA Assay (FDA approved for NASAL specimens in patients 58 years of age and older), is one component of a comprehensive surveillance program. It is not intended to diagnose infection nor to guide or monitor treatment. Performed at [facility], [address] [place], [place] [address]     Radiology Reports Ct Angio Head W Or Wo Contrast  Result Date: 02/28/2019 CLINICAL DATA:  Stroke follow-up. 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 EXAM: CT ANGIOGRAPHY HEAD AND NECK TECHNIQUE: Multidetector CT imaging of the head and neck was performed using the standard protocol during bolus administration of intravenous contrast. Multiplanar CT image reconstructions and MIPs were obtained to evaluate the vascular anatomy. Carotid stenosis measurements (when applicable) are obtained utilizing NASCET criteria, using the distal internal carotid diameter as the denominator. CONTRAST:  75mL ISOVUE-370 IOPAMIDOL (ISOVUE-370) INJECTION 76% COMPARISON:  MRI of the brain 02/15/2019 FINDINGS: CT HEAD FINDINGS Brain: The left internal capsule infarct is again noted, now slightly lower density than on the previous CT. No new infarct is present. The remote right MCA  territory encephalomalacia is stable. The ventricles are of proportionate to the degree of atrophy. No significant extraaxial fluid collection is present. The brainstem and cerebellum are within normal limits. Vascular: Atherosclerotic calcifications are present within the cavernous internal carotid arteries bilaterally. There is no hyperdense vessel. Skull: The craniocervical junction is normal. Upper cervical spine is within normal limits. Marrow signal is unremarkable. Sinuses: Scattered opacification of left ethmoid air cells are present. There are no fluid levels. The paranasal sinuses and mastoid air cells are otherwise clear. Orbits: The globes and orbits are within normal limits. Review of the MIP images confirms the above findings CTA NECK FINDINGS Aortic arch: A 3 vessel arch configuration is present. Minimal atherosclerotic changes are present at the great vessel origins. There is no significant stenosis or aneurysm. Right carotid system: The right common carotid artery is within normal limits. Dense calcifications are present at the right carotid bifurcation. There is a high-grade stenosis at the carotid bifurcation. Lumen is narrowed to less than 1 mm. The more distal right common carotid artery is within normal limits to the skull base. Left carotid system: The left common carotid artery demonstrates some atherosclerotic irregularity. There is a high-grade, near occlusive stenosis of the left internal carotid artery at its bifurcation. The cervical left ICA is otherwise normal. Vertebral arteries: Extensive atherosclerotic calcifications are present along the vertebral arteries bilaterally. The right vertebral artery is dominant. There is a high-grade stenosis at the proximal left vertebral artery. No significant stenosis is present in the right vertebral artery. The left vertebral artery is reconstituted at the distal V1 segment. Extensive atherosclerotic changes are present throughout the V2 segment.  These are high-grade stenosis at the level of C1. Skeleton: Vertebral body heights alignment are maintained. No focal lytic or blastic lesions are present. Other neck:  The soft tissues the neck are otherwise unremarkable. No focal mucosal or submucosal lesions are present. Salivary glands are within normal limits. No significant adenopathy is present. Thyroid is normal. Upper chest: Next mild dependent atelectasis is present. The lung apices are otherwise clear. Thoracic inlet is within normal limits. Review of the MIP images confirms the above findings CTA HEAD FINDINGS Anterior circulation: Atherosclerotic calcifications are present within the cavernous internal carotid arteries bilaterally without a significant stenosis through the ICA termini. The left A1 is hypoplastic. The right A1 is normal. The anterior communicating artery is patent. ACA branch vessels are within normal limits bilaterally. There is a high-grade stenosis of the anterior right M2 segment with marked attenuation of distal branches. Diffuse irregularity present and more posterior left MCA branches. There is moderate irregularity in left MCA branches. Pial collaterals are evident. Posterior circulation: The right vertebral artery is the dominant vessel. Segmental irregularity is present in the left V4 segment without a significant stenosis. PICA origins are visualized and normal. The vertebrobasilar junction is normal. The basilar artery is normal. Both posterior cerebral arteries originate from the basilar tip. There is some irregularity of the proximal PCA vessels without significant proximal stenosis. Branch vessels are intact. Venous sinuses: The dural sinuses are patent. Anatomic variants: None Delayed phase: No pathologic enhancement is present. Infarcts are well-defined. Review of the MIP images confirms the above findings IMPRESSION: 1. High-grade bilateral proximal ICA stenoses at the carotid bifurcations. 2. High-grade stenosis of the  proximal left vertebral artery with reconstitution prior to the V2 segment. 3. Hypoplastic left A1 segment. 4. High-grade stenosis of the anterior right M2 segment. 5. Moderate diffuse medium and distal small vessel disease in both the anterior and posterior circulations. 6. Expected evolution of left internal capsule nonhemorrhagic infarct. 7. 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 Electronically Signed   By: Marin Roberts M.D.   On: 02/28/2019 21:01   Dg Chest 2 View  Result Date: 03/09/2019 CLINICAL DATA:  Altered mental status today. EXAM: CHEST - 2 VIEW COMPARISON:  Single-view of the chest 07/16/2006. FINDINGS: Lungs clear. Heart size normal. No pneumothorax or pleural fluid. No acute or focal bony abnormality. IMPRESSION: Negative chest. Electronically Signed   By: Drusilla Kanner M.D.   On: 03/09/2019 16:00   Dg Knee 2 Views Right  Result Date: 02/18/2019 CLINICAL DATA:  Knee pain, initial encounter EXAM: RIGHT KNEE - 2 VIEW COMPARISON:  None. FINDINGS: Mild medial joint space narrowing is noted. No acute fracture or dislocation is seen. Mild patellofemoral spurring is noted as well. IMPRESSION: Mild degenerative change without acute abnormality. Electronically Signed   By: Alcide Clever M.D.   On: 02/28/2019 16:03   Ct Head Wo Contrast  Result Date: 02/21/2019 CLINICAL DATA:  Altered level of consciousness EXAM: CT HEAD WITHOUT CONTRAST TECHNIQUE: Contiguous axial images were obtained from the base of the skull through the vertex without intravenous contrast. COMPARISON:  None. FINDINGS: Brain: Mild atrophic changes are noted. Encephalomalacia changes are seen in the distribution of the right middle cerebral artery consistent with prior infarct. Rounded decreased area of attenuation is noted in the region of the internal capsule on the left suggestive of subacute to chronic ischemia. No focal area of acute infarct or acute hemorrhage is seen. No space-occupying mass  lesion is noted. Vascular: No hyperdense vessel or unexpected calcification. Skull: Normal. Negative for fracture or focal lesion. Sinuses/Orbits: No acute finding. Other: None. IMPRESSION: Chronic atrophic changes. Findings of prior right MCA  infarct with encephalomalacia. Rounded somewhat elongated area of decreased attenuation on the left in the region of the internal capsule and basal ganglia consistent with subacute to chronic ischemia. No acute infarct is noted. Electronically Signed   By: Alcide Clever M.D.   On: March 07, 2019 16:06   Ct Angio Neck W Or Wo Contrast  Result Date: 02/28/2019 CLINICAL DATA:  Stroke follow-up. 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 EXAM: CT ANGIOGRAPHY HEAD AND NECK TECHNIQUE: Multidetector CT imaging of the head and neck was performed using the standard protocol during bolus administration of intravenous contrast. Multiplanar CT image reconstructions and MIPs were obtained to evaluate the vascular anatomy. Carotid stenosis measurements (when applicable) are obtained utilizing NASCET criteria, using the distal internal carotid diameter as the denominator. CONTRAST:  75mL ISOVUE-370 IOPAMIDOL (ISOVUE-370) INJECTION 76% COMPARISON:  MRI of the brain 03/07/19 FINDINGS: CT HEAD FINDINGS Brain: The left internal capsule infarct is again noted, now slightly lower density than on the previous CT. No new infarct is present. The remote right MCA territory encephalomalacia is stable. The ventricles are of proportionate to the degree of atrophy. No significant extraaxial fluid collection is present. The brainstem and cerebellum are within normal limits. Vascular: Atherosclerotic calcifications are present within the cavernous internal carotid arteries bilaterally. There is no hyperdense vessel. Skull: The craniocervical junction is normal. Upper cervical spine is within normal limits. Marrow signal is unremarkable. Sinuses: Scattered  opacification of left ethmoid air cells are present. There are no fluid levels. The paranasal sinuses and mastoid air cells are otherwise clear. Orbits: The globes and orbits are within normal limits. Review of the MIP images confirms the above findings CTA NECK FINDINGS Aortic arch: A 3 vessel arch configuration is present. Minimal atherosclerotic changes are present at the great vessel origins. There is no significant stenosis or aneurysm. Right carotid system: The right common carotid artery is within normal limits. Dense calcifications are present at the right carotid bifurcation. There is a high-grade stenosis at the carotid bifurcation. Lumen is narrowed to less than 1 mm. The more distal right common carotid artery is within normal limits to the skull base. Left carotid system: The left common carotid artery demonstrates some atherosclerotic irregularity. There is a high-grade, near occlusive stenosis of the left internal carotid artery at its bifurcation. The cervical left ICA is otherwise normal. Vertebral arteries: Extensive atherosclerotic calcifications are present along the vertebral arteries bilaterally. The right vertebral artery is dominant. There is a high-grade stenosis at the proximal left vertebral artery. No significant stenosis is present in the right vertebral artery. The left vertebral artery is reconstituted at the distal V1 segment. Extensive atherosclerotic changes are present throughout the V2 segment. These are high-grade stenosis at the level of C1. Skeleton: Vertebral body heights alignment are maintained. No focal lytic or blastic lesions are present. Other neck: The soft tissues the neck are otherwise unremarkable. No focal mucosal or submucosal lesions are present. Salivary glands are within normal limits. No significant adenopathy is present. Thyroid is normal. Upper chest: Next mild dependent atelectasis is present. The lung apices are otherwise clear. Thoracic inlet is within  normal limits. Review of the MIP images confirms the above findings CTA HEAD FINDINGS Anterior circulation: Atherosclerotic calcifications are present within the cavernous internal carotid arteries bilaterally without a significant stenosis through the ICA termini. The left A1 is hypoplastic. The right A1 is normal. The anterior communicating artery is patent. ACA branch vessels are within normal limits bilaterally. There  is a high-grade stenosis of the anterior right M2 segment with marked attenuation of distal branches. Diffuse irregularity present and more posterior left MCA branches. There is moderate irregularity in left MCA branches. Pial collaterals are evident. Posterior circulation: The right vertebral artery is the dominant vessel. Segmental irregularity is present in the left V4 segment without a significant stenosis. PICA origins are visualized and normal. The vertebrobasilar junction is normal. The basilar artery is normal. Both posterior cerebral arteries originate from the basilar tip. There is some irregularity of the proximal PCA vessels without significant proximal stenosis. Branch vessels are intact. Venous sinuses: The dural sinuses are patent. Anatomic variants: None Delayed phase: No pathologic enhancement is present. Infarcts are well-defined. Review of the MIP images confirms the above findings IMPRESSION: 1. High-grade bilateral proximal ICA stenoses at the carotid bifurcations. 2. 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 3. Hypoplastic left A1 segment. 4. High-grade stenosis of the anterior right M2 segment. 5. Moderate diffuse medium and distal small vessel disease in both the anterior and posterior circulations. 6. Expected evolution of left internal capsule nonhemorrhagic infarct. 7. 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 Electronically Signed   By: Marin Roberts M.D.   On: 02/28/2019 21:01   Mr  Brain Wo Contrast  Result Date: 03/03/2019 CLINICAL DATA:  Focal neuro deficit for greater than 6 hours. Altered level of consciousness. EXAM: MRI HEAD WITHOUT CONTRAST TECHNIQUE: Multiplanar, multiecho pulse sequences of the brain and surrounding structures were obtained without intravenous contrast. COMPARISON:  CT head without contrast 01/29/2019 FINDINGS: Brain: The diffusion-weighted images confirm an acute nonhemorrhagic infarct involving the genu of the left internal capsule and globus pallidus. T2 signal changes are associated with the acute infarct, consistent with the subacute time frame. Remote encephalomalacia is again noted right MCA territory. The ventricles are of proportionate to the degree of atrophy. Wallerian degeneration is present in the right cerebral peduncle extending into the pons. Cerebellum is normal. No significant extraaxial fluid collection is present. Vascular: Flow is present in the major intracranial arteries. Skull and upper cervical spine: The craniocervical junction is normal. Upper cervical spine is within normal limits. Marrow signal is unremarkable. Sinuses/Orbits: The paranasal sinuses and mastoid air cells are clear. Bilateral lens replacements are noted. The globes and orbits are within normal limits. IMPRESSION: 1. 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 This is consistent with a time frame of greater than 6 hours. 2. 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 Electronically Signed   By: Marin Roberts M.D.   On: 02/22/2019 19:18   Dg Foot Complete Right  Result Date: 02/09/2019 CLINICAL DATA:  Right foot pain EXAM: RIGHT FOOT COMPLETE - 3+ VIEW COMPARISON:  None. FINDINGS: Comminuted fracture is noted at the base of the first distal phalanx. This extends into the articular surface in multiple locations. No other fracture is seen. Soft tissue swelling is noted.  IMPRESSION: Comminuted fracture of the first distal phalanx which extends to the articular surface. Electronically Signed   By: Alcide Clever M.D.   On: 03/02/2019 16:01   Vas [place] Vanice Sarah With/wo Tbi  Result Date: 03/01/2019 LOWER EXTREMITY DOPPLER STUDY Indications: Gangrene. High Risk Factors: Current smoker.  Comparison Study: No prior study on file Performing Technologist: Sherren Kerns RVS  Examination Guidelines: A complete evaluation includes at minimum, Doppler waveform signals and systolic blood pressure reading at the level of bilateral brachial,  anterior tibial, and posterior tibial arteries, when vessel segments are accessible. Bilateral testing is considered an integral part of a complete examination. Photoelectric Plethysmograph (PPG) waveforms and toe systolic pressure readings are included as required and additional duplex testing as needed. Limited examinations for reoccurring indications may be performed as noted.  ABI Findings: +---------+------------------+-----+-------------------+-----------------------+  Right     Rt Pressure (mmHg) Index Waveform            Comment                  +---------+------------------+-----+-------------------+-----------------------+  Brachial  183                      triphasic                                    +---------+------------------+-----+-------------------+-----------------------+  PTA       67                 0.37  dampened monophasic                          +---------+------------------+-----+-------------------+-----------------------+  DP        74                 0.40  dampened monophasic                          +---------+------------------+-----+-------------------+-----------------------+  Great Toe                                              Not done secondary to                                                            gangrene                 +---------+------------------+-----+-------------------+-----------------------+  +--------+------------------+-----+---------+-------+  Left     Lt Pressure (mmHg) Index Waveform  Comment  +--------+------------------+-----+---------+-------+  Brachial 171                      triphasic          +--------+------------------+-----+---------+-------+  PTA      185                1.01  biphasic           +--------+------------------+-----+---------+-------+  DP       176                0.96  biphasic           +--------+------------------+-----+---------+-------+ +-------+-----------+-----------+------------+------------+  ABI/TBI Today's ABI Today's TBI Previous ABI Previous TBI  +-------+-----------+-----------+------------+------------+  Right   0.40                                               +-------+-----------+-----------+------------+------------+  Left    1.01                                               +-------+-----------+-----------+------------+------------+  Summary: Right: 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 Left: Resting left ankle-brachial index is within normal range. No evidence of significant left lower extremity arterial disease.  *See table(s) above for measurements and observations.  Electronically signed by Coral Else MD on 03/01/2019 at 10:49:49 AM.    Final    Vas US Carotid  Result Date: 02/22/2019 Carotid Arterial Duplex Study Indications:                           ICA stenosis. Limitations:                           Technically difficult patient due                                        movement, cooperation, and positioning. Pre-Surgical Evaluation & Surgical     Stenosis at bifurcation only. ICA is not Correlation:                           normal past stenosis. Bifurcation is                                        located near the Hyoid Notch. Right sided                                        80 to 99% ICA stenosis originates within                                        the bifurcation and extends  approximately                                        1.1 cm into the proximal ICA. There is no                                        apparent hemodynamically significant                                        stenosis involving the mid or distal ICA.                                        Left sided 80 to 99% ICA stenosis                                        originates within the bifurcation and  extends approximately 1.1 cm into the                                        proximal ICA. There is no apparent                                        hemodynamically significant stenosis                                        involving the mid or distal ICA. Performing Technologist: Chanda Busing RVT  Examination Guidelines: A complete evaluation includes B-mode imaging, spectral Doppler, color Doppler, and power Doppler as needed of all accessible portions of each vessel. Bilateral testing is considered an integral part of a complete examination. Limited examinations for reoccurring indications may be performed as noted.  Right Carotid Findings: +----------+--------+--------+--------+--------+--------+             PSV cm/s EDV cm/s Stenosis Describe Comments  +----------+--------+--------+--------+--------+--------+  CCA Distal 63       16                                   +----------+--------+--------+--------+--------+--------+  ICA Prox   397      115      80-99%                      +----------+--------+--------+--------+--------+--------+  ICA Distal 85       31                                   +----------+--------+--------+--------+--------+--------+  Left Carotid Findings: +----------+--------+--------+--------+--------+--------+             PSV cm/s EDV cm/s Stenosis Describe Comments  +----------+--------+--------+--------+--------+--------+  CCA Distal 36       9                                    +----------+--------+--------+--------+--------+--------+  ICA Prox    358      123      80-99%                      +----------+--------+--------+--------+--------+--------+  ICA Distal 106      29                                   +----------+--------+--------+--------+--------+--------+  Summary: Right Carotid: Right sided 80 to 99% ICA stenosis originates within the                bifurcation and extends approximately 1.1 cm into the proximal                ICA. There is no apparent hemodynamically significant stenosis                involving the mid or distal ICA. Left Carotid: Left sided 80 to 99% ICA stenosis originates within the  bifurcation and extends approximately 1.1 cm into the proximal               ICA. There is no apparent hemodynamically significant stenosis               involving the mid or distal ICA.  *See table(s) above for measurements and observations.  Electronically signed by Coral Else MD on 02/12/2019 at 1:35:19 PM.    Final    US Abdomen Limited Ruq  Result Date: 03/01/2019 CLINICAL DATA:  Elevated LFTs. EXAM: ULTRASOUND ABDOMEN LIMITED RIGHT UPPER QUADRANT COMPARISON:  None. FINDINGS: Gallbladder: Physiologically distended. No gallstones or wall thickening visualized. No sonographic Murphy sign noted by sonographer. Common bile duct: Diameter: 6 mm, normal. Liver: No focal lesion identified. Diffusely increased and heterogeneous in parenchymal echogenicity. Portal vein is patent on color Doppler imaging with normal direction of blood flow towards the liver. IMPRESSION: 1. Hepatic steatosis. 2. Normal sonographic appearance of gallbladder and biliary tree. Electronically Signed   By: Narda Rutherford M.D.   On: 03/01/2019 23:36    Lab Data:  CBC: Recent Labs  Lab 02/13/2019 1450 02/28/19 0338 03/01/19 0348 02/18/2019 0337 12-Mar-2019 0316 03/04/19 0233  WBC 14.6* 13.9* 12.4* 12.9* 12.6* 15.2*  NEUTROABS 10.9*  --  7.7  --   --   --   HGB 18.4* 16.8 15.5 16.9 13.8 13.3  HCT 55.4* 49.3 46.9 48.2 41.4 38.1*  MCV 88.4 88.5 89.2  86.5 87.7 87.6  PLT 314 274 259 219 253 234   Basic Metabolic Panel: Recent Labs  Lab 02/28/19 0338 03/01/19 0348 02/11/2019 0337 03-12-2019 0316 03/04/19 0233  NA 141 140 136 134* 133*  K 3.8 3.5 3.8 3.2* 3.5  CL 105 103 103 103 104  CO2 23 25 23 23 22   GLUCOSE 213* 123* 143* 156* 141*  BUN 25* 16 13 14 11   CREATININE 1.15 0.99 0.89 1.05 0.92  CALCIUM 8.9 8.6* 8.8* 8.1* 8.2*   GFR: Estimated Creatinine Clearance: 107.9 mL/min (by C-G formula based on SCr of 0.92 mg/dL). Liver Function Tests: Recent Labs  Lab 02/19/2019 1450 02/28/19 0338 03/01/19 0348 03/12/19 0316 03/04/19 0233  AST 136* 112* 97* 59* 42*  ALT 69* 67* 65* 62* 58*  ALKPHOS 98 79 75 75 62  BILITOT 1.5* 0.9 0.6 0.5 0.6  PROT 7.5 6.2* 6.1* 5.3* 5.3*  ALBUMIN 3.7 3.1* 2.9* 2.4* 2.2*   Recent Labs  Lab 02/25/2019 1450  LIPASE 36   Recent Labs  Lab 02/11/2019 1519  AMMONIA 25   Coagulation Profile: No results for input(s): INR, PROTIME in the last 168 hours. Cardiac Enzymes: Recent Labs  Lab 02/26/2019 1450 02/14/2019 2235 02/28/19 0338 02/28/19 1004 03/01/19 0348  CKTOTAL 3,323*  --  2,223*  --  1,051*  TROPONINI 0.03* <0.03 0.06* 0.03*  --    BNP (last 3 results) No results for input(s): PROBNP in the last 8760 hours. HbA1C: No results for input(s): HGBA1C in the last 72 hours. CBG: Recent Labs  Lab Mar 12, 2019 1125 03/12/2019 1612 March 12, 2019 2119 03/04/19 0602 03/04/19 1218  GLUCAP 129* 249* 140* 150* 223*   Lipid Profile: No results for input(s): CHOL, HDL, LDLCALC, TRIG, CHOLHDL, LDLDIRECT in the last 72 hours. Thyroid Function Tests: No results for input(s): TSH, T4TOTAL, FREET4, T3FREE, THYROIDAB in the last 72 hours. Anemia Panel: No results for input(s): VITAMINB12, FOLATE, FERRITIN, TIBC, IRON, RETICCTPCT in the last 72 hours. Urine analysis:    Component Value Date/Time   COLORURINE YELLOW 02/11/2019  1604   APPEARANCEUR CLEAR 02/16/2019 1604   LABSPEC 1.026 02/22/2019 1604    PHURINE 5.0 02/28/2019 1604   GLUCOSEU 150 (A) 02/07/2019 1604   HGBUR SMALL (A) 02/19/2019 1604   BILIRUBINUR NEGATIVE 02/11/2019 1604   KETONESUR 20 (A) 02/12/2019 1604   PROTEINUR NEGATIVE 02/21/2019 1604   NITRITE NEGATIVE 03/09/2019 1604   LEUKOCYTESUR NEGATIVE 03/04/2019 1604     Saga Balthazar M.D. [facility] 03/05/2019, 9:53 AM  Pager: [phone] Between 7am to 7pm - call Pager - [phone]  After 7pm go to [email or web address] - password [name]  Call night coverage person covering after 7pm

## 2019-03-05 NOTE — TOC Progression Note (Signed)
Transition of Care [facility]) - Progression Note    Patient Details  Name: Noah Thomas MRN: [id number] Date of Birth: May 06, 1961  [facility]) CM/SW Contact  Doy Hutching, [place] Phone Number: 03/05/2019, 10:03 AM  Clinical Narrative:    Pt not eligible at this time for residential hospice, will need to send further referrals to SNF for LOG placement. Have spoken with Dr. Isidoro Donning and also provided this update for today. Aware scripts and DNR are complete, pt PASRR complete, waiting on placement.    Expected Discharge Plan: Skilled Nursing Facility Barriers to Discharge: Continued Medical Work up  Expected Discharge Plan and Services Expected Discharge Plan: Skilled Nursing Facility In-house Referral: Clinical Social Work Discharge Planning Services: NA   Living arrangements for the past 2 months: Mobile Home                 DME Arranged: N/A DME Agency: NA HH Arranged: NA HH Agency: NA   Social Determinants of Health (SDOH) Interventions    Readmission Risk Interventions No flowsheet data found.

## 2019-03-05 NOTE — Progress Notes (Signed)
Spoke to brother and gave him an update.

## 2019-03-05 NOTE — Progress Notes (Signed)
Nutrition Brief Note  Chart reviewed. Pt now transitioning to comfort care.  No further nutrition interventions warranted at this time.  Please re-consult as needed.   Tahiry Spicer A. Margy Sumler, RD, LDN, CDCES Registered Dietitian II Certified Diabetes Care and Education Specialist Pager: [phone] After hours Pager: [phone]  

## 2019-03-05 NOTE — Progress Notes (Signed)
Pt's BP was 194/104, HR was 129, metropolol given. Pt refused to get a new IV for PRN BP meds.   Rechecked at 2320, BP 171/98. Nofify MD.

## 2019-03-05 NOTE — Progress Notes (Addendum)
Civil engineer, contracting [facility])  Continue to follow for patient/family interest in [facility] services after discharge. Medical record reviewed and Mr. Skarda is eligible for Hospice services at home or LTC facility. He is not eligible for [facility] at this time. Spoke with CSW Rutherford Nail and patient's brother by phone to convey above information. Will continue to follow at brother's request for possible [facility] admission at home or at LTC.   Thank you,  Forrestine Him, LCSW [phone]  [facility] liaisons are listed daily on AMION under [facility]

## 2019-03-05 NOTE — Progress Notes (Signed)
Visited with patient at bedside.  He reports he is comfortable, having bowel movements appropriate, sleeping, eating.    He has an empty lunch tray next to him and appears in no distress.  I called his brother Rosanne Ashing to give him an update.   Recommendation: Continue current care.  Patient wants comfort measures with out invasive treatments. DC to nursing home when bed available ideally with Hospice to follow, but if not Hospice then Palliative Care to follow at Nursing home.  Please write "Hospice and Palliative Care to follow at Nursing Facility" in the discharge summary.  PMT will follow from a distance.  Please call our office if attention is needed more urgently.  Norvel Richards, PA-C Palliative Medicine Pager: [phone]  15 min.

## 2019-03-06 MED ORDER — METOPROLOL TARTRATE 50 MG PO TABS
50.0000 mg | ORAL_TABLET | Freq: Two times a day (BID) | ORAL | Status: DC
  Administered 2019-03-06 – 2019-03-08 (×6): 50 mg via ORAL
  Filled 2019-03-06 (×7): qty 1

## 2019-03-06 NOTE — Progress Notes (Signed)
[facility]                                                                              Patient Demographics  Noah Thomas, is a 58 y.o. male, DOB - September 08, 1961, RUE:[id number]  Admit date - 03/05/2019   Admitting Physician Elease Etienne, MD  Outpatient Primary MD for the patient is Patient, No Pcp Per  Outpatient specialists:   LOS - 7  days   Medical records reviewed and are as summarized below:    Chief Complaint  Patient presents with   Altered Mental Status       Brief summary   :58 year old male, lives alone in a mobile home and independent, has not seen a physician in a long time, PMH of significant longstanding mental health issues, not on prescription medications, tobacco abuse, presented to [facility] ED on 03/07/2019 via EMS with altered mental status. History obtained from brother. Brother indicated that patient was normal until his teenage years when he started becoming paranoid and agitated while living with his parents, eventually had to be placed in [facility]. Over the years patient has calmed down, able to go to the grocery store for food, cigarettes but has no insight and unable to truly care for himself. He lives in [place] conditions to an extent that when EMS went to retrieve him today, authorities were planning to "condemn" his trailer home. Patient's brother has given him a debit card and provides with the finances. Patient last visited his brother on [birth year], his birthday, at which point he was in his usual state of health except he complained of some toe pain but was managing to ambulate. Since then the brother was trying to reach him by phone for several days and went over to his house a day before the admission but did not get an answer at the door. EMS found the patient on the ground in filthy condition, confused. Patient is unable to provide any history.   ED Course:Noted to be in filthy condition, tachycardic, not febrile, not  hypotensive, leukocytosis, elevated lactate, right toe exam suggestive of possible cellulitis, initiated sepsis protocol and received IV fluids and broad-spectrum IV antibiotics. Orthopedics consulted by EDP and will see him in the morning. WBC 14.6, hemoglobin 18.4, glucose 211, BUN 33, AST 136, ALT 69, total bilirubin 1.5, CK 3000 323, troponin 0 0.03, urine microscopy not suggestive of UTI, chest x-ray negative, CT head suggests subacute to chronic ischemia, right foot x-ray showed comminuted fracture of the first distal phalanx.  Assessment & Plan    Principal Problem:   Right great toe gangrene/ abscess:  - patient was found to have comminuted fracture of the right first distal phalanx extending to the articular surface -Patient was placed on IV vancomycin and cefepime, stopped 24 hours postop per Ortho recommendations.   -Per Dr. Lajoyce Corners,  wound [facility] for 1 week, NWB on R foot -Vascular surgery was consulted, patient underwent bilateral lower extremity arteriogram on 3/25, will eventually need right iliac thrombectomy, right iliac stent and right femoral AKA popliteal bypass after the carotid procedure next week.  Patient was started on heparin drip. -Palliative  medicine was consulted for goals of care.  Per patient and his brother's (HPOA) wishes, he was transitioned to comfort care on 3/26.  Patient did not want any further invasive interventions.  Acute CVA/ bilat ICA stenosis: involving the left internal capsule and basal ganglia.  -CT head showed chronic atrophy, prior R MCA infarction with encephalomalacia, subacute to chronic left internal capsule/BG infarct -MRI of the brain showed acute/subacute infarct left internal capsule/BG.  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  Neurology recommended aspirin 81 mg and Plavix 75 mg daily for 3 weeks and then aspirin alone.   -Currently comfort  care status per goals of care  Acute metabolic encephalopathy -Secondary to CVA, acute infection, dehydration, may have underlying psych illness -Per patient's brother, reportedly he had a mental illness since a teenager, was in but not at one point, as an adult never worked more than a week and lives alone.  Patient has been dependent on his family all his adult life for food enlarging. -Per psychiatry recommendations, EKG obtained, QTC improved.  Started on Risperdal -Continue comfort care status per patient's wishes  Diabetes mellitus type 2 -New diagnosis, hemoglobin A1c 7.7.  Patient had not been following with doctors. -Patient was seen by diabetic coordinator, recommended metformin at the time of discharge. -On sliding scale insulin, currently comfort care  Transaminitis -Improving, right upper quadrant ultrasound showed fatty liver  Generalized weakness, acute CVA PT OT consulted, recommended skilled nursing facility  Rhabdomyolysis, acute, mild -Resolved with IV fluids  Goals of care Goals of care meeting on 3/26, management was addressed with patient and his brother by vascular surgery, myself and palliative medicine.  Patient has opted for no further invasive interventions and requested comfort care.  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 03/01/19 1706 02/10/2019 0805   02/28/19 2100  vancomycin (VANCOCIN) 1,000 mg in sodium chloride 0.9 % 250 mL IVPB     1,000 mg 250 mL/hr over 60 Minutes Intravenous Every 12 hours 02/28/19 1707 03/07/2019 1159   02/28/19 0600  vancomycin (VANCOCIN) IVPB 1000 mg/200 mL premix  Status:  Discontinued     1,000 mg 200 mL/hr over 60 Minutes Intravenous Every 12 hours 02/08/2019 1708 02/28/19 1707   02/28/19 0600  ceFEPIme (MAXIPIME) 2 g in sodium chloride 0.9 % 100 mL IVPB     2 g 200 mL/hr over 30 Minutes Intravenous Every 12 hours 02/28/2019 1708 02/13/2019 0959   03/04/2019 1700  vancomycin (VANCOCIN) 1,500 mg in sodium chloride  0.9 % 500 mL IVPB     1,500 mg 250 mL/hr over 120 Minutes Intravenous  Once 15-Mar-2019 1613 15-Mar-2019 2250   03/15/2019 1615  ceFEPIme (MAXIPIME) 2 g in sodium chloride 0.9 % 100 mL IVPB     2 g 200 mL/hr over 30 Minutes Intravenous  Once 2019-03-15 1610 03-15-19 1715   Mar 15, 2019 1615  metroNIDAZOLE (FLAGYL) IVPB 500 mg     500 mg 100 mL/hr over 60 Minutes Intravenous  Once March 15, 2019 1610 2019-03-15 1845   2019/03/15 1615  vancomycin (VANCOCIN) IVPB 1000 mg/200 mL premix  Status:  Discontinued     1,000 mg 200 mL/hr over 60 Minutes Intravenous  Once 15-Mar-2019 1610 2019/03/15 1613         Medications  Scheduled Meds:  aspirin EC  81 mg Oral Daily   clopidogrel  75 mg Oral Daily   docusate sodium  100 mg Oral BID   Influenza vac split quadrivalent PF  0.5 mL Intramuscular Tomorrow-1000   metoprolol tartrate  5 mg Intravenous Once   metoprolol tartrate  50 mg Oral BID   nicotine  21 mg Transdermal Daily   pentoxifylline  400 mg Oral TID WC   pneumococcal 23 valent vaccine  0.5 mL  Intramuscular Tomorrow-1000   risperiDONE  0.5 mg Oral BID   senna-docusate  2 tablet Oral QHS   Continuous Infusions:  methocarbamol (ROBAXIN) IV     PRN Meds:.acetaminophen **OR** acetaminophen, bisacodyl, hydrALAZINE, HYDROmorphone (DILAUDID) injection, labetalol, magnesium citrate, methocarbamol **OR** methocarbamol (ROBAXIN) IV, ondansetron **OR** ondansetron (ZOFRAN) IV, oxyCODONE, oxyCODONE, polyethylene glycol      Subjective:   Noah Thomas was seen and examined today.  Comfortable, denies any specific complaints.  Overnight BP elevated.  No fevers.    Objective:   Vitals:   03/05/19 2128 03/05/19 2321 03/06/19 0055 03/06/19 0547  BP: (!) 194/103 (!) 171/98 (!) 169/96 128/84  Pulse: (!) 129 98 99 (!) 124  Resp: 18   18  Temp: 100.1 F (37.8 C)   98.2 F (36.8 C)  TempSrc: Oral   Oral  SpO2: 96%   97%  Weight:      Height:        Intake/Output Summary (Last 24 hours) at 03/06/2019 1056 Last data filed at 03/06/2019 0400 Gross per 24 hour  Intake 600 ml  Output --  Net 600 ml     Wt Readings from Last 3 Encounters:  03/04/19 101.5 kg    Physical Exam  General: Alert and oriented x 3, NAD, comfortable  Eyes:   HEENT:  Atraumatic, normocephalic  Cardiovascular: S1 S2 clear, no murmurs, RRR. No pedal edema b/l  Respiratory: CTAB, no wheezing, rales or rhonchi  Gastrointestinal: Soft, nontender, nondistended, NBS  Ext: no pedal edema bilaterally  Neuro:   Musculoskeletal:   Skin:   Psych: Alert and awake    Data Reviewed:  I have personally reviewed following labs and imaging studies  Micro Results Recent Results (from the past 240 hour(s))  Urine culture     Status: None   Collection Time: 2019-03-15  4:04 PM  Result Value Ref Range Status   Specimen Description URINE, CATHETERIZED  Final   Special Requests Normal  Final   Culture   Final    NO GROWTH Performed at [facility], [address] [place], [place] [address]     Report Status 02/28/2019 FINAL  Final  Surgical pcr screen     Status: None   Collection Time: 03/01/19  5:15 PM  Result Value Ref Range Status   MRSA, PCR NEGATIVE NEGATIVE Final   Staphylococcus aureus NEGATIVE NEGATIVE Final    Comment: (NOTE) The Xpert SA Assay (FDA approved for NASAL specimens in patients 57 years of age and older), is one component of a comprehensive surveillance program. It is not intended to diagnose infection nor to guide or monitor treatment. Performed at [facility], [address] [place], [place] [address]     Radiology Reports Ct Angio Head W Or Wo Contrast  Result Date: 02/28/2019 CLINICAL DATA:  Stroke follow-up. 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 EXAM: CT ANGIOGRAPHY HEAD AND NECK TECHNIQUE: Multidetector CT imaging of the head and neck was performed using the standard protocol during bolus administration of intravenous contrast. Multiplanar CT image reconstructions and MIPs were obtained to evaluate the vascular anatomy. Carotid stenosis measurements (when applicable) are obtained utilizing NASCET criteria, using the distal internal carotid diameter as the denominator. CONTRAST:  75mL ISOVUE-370 IOPAMIDOL (ISOVUE-370) INJECTION 76% COMPARISON:  MRI of the brain 02/26/2019 FINDINGS: CT HEAD FINDINGS Brain: The left internal capsule infarct is again noted, now slightly lower density than on the previous CT. No new infarct is present. The remote right MCA territory encephalomalacia is stable. The ventricles are of proportionate to the degree of atrophy. No significant extraaxial fluid collection is present. The brainstem and cerebellum are within normal limits. Vascular: Atherosclerotic calcifications are present within the cavernous internal carotid arteries bilaterally. There is no hyperdense vessel. Skull: The craniocervical junction is normal. Upper cervical spine is within normal limits. Marrow signal  is unremarkable. Sinuses: Scattered opacification of left ethmoid air cells are present. There are no fluid levels. The paranasal sinuses and mastoid air cells are otherwise clear. Orbits: The globes and orbits are within normal limits. Review of the MIP images confirms the above findings CTA NECK FINDINGS Aortic arch: A 3 vessel arch configuration is present. Minimal atherosclerotic changes are present at the great vessel origins. There is no significant stenosis or aneurysm. Right carotid system: The right common carotid artery is within normal limits. Dense calcifications are present at the right carotid bifurcation. There is a high-grade stenosis at the carotid bifurcation. Lumen is narrowed to less than 1 mm. The more distal right common carotid artery is within normal limits to the skull base. Left carotid system: The left common carotid artery demonstrates some atherosclerotic irregularity. There is a high-grade, near occlusive stenosis of the left internal carotid artery at its bifurcation. The cervical left ICA is otherwise normal. Vertebral arteries: Extensive atherosclerotic calcifications are present along the vertebral arteries bilaterally. The right vertebral artery is dominant. There is a high-grade stenosis at the proximal left vertebral artery. No significant stenosis is present in the right vertebral artery. The left vertebral artery is reconstituted at the distal V1 segment. Extensive atherosclerotic changes are present throughout the V2 segment. These are high-grade stenosis at the level of C1. Skeleton: Vertebral body heights alignment are maintained. No focal lytic or blastic lesions are present. Other neck: The soft tissues the neck are otherwise unremarkable. No focal mucosal or submucosal lesions are present. Salivary glands are within normal limits. No significant adenopathy is present. Thyroid is normal. Upper chest: Next mild dependent atelectasis is present. The lung apices are otherwise  clear. Thoracic inlet is within normal limits. Review of the MIP images confirms the above findings CTA HEAD FINDINGS Anterior circulation: Atherosclerotic calcifications are present within the cavernous internal carotid arteries  bilaterally without a significant stenosis through the ICA termini. The left A1 is hypoplastic. The right A1 is normal. The anterior communicating artery is patent. ACA branch vessels are within normal limits bilaterally. There is a high-grade stenosis of the anterior right M2 segment with marked attenuation of distal branches. Diffuse irregularity present and more posterior left MCA branches. There is moderate irregularity in left MCA branches. Pial collaterals are evident. Posterior circulation: The right vertebral artery is the dominant vessel. Segmental irregularity is present in the left V4 segment without a significant stenosis. PICA origins are visualized and normal. The vertebrobasilar junction is normal. The basilar artery is normal. Both posterior cerebral arteries originate from the basilar tip. There is some irregularity of the proximal PCA vessels without significant proximal stenosis. Branch vessels are intact. Venous sinuses: The dural sinuses are patent. Anatomic variants: None Delayed phase: No pathologic enhancement is present. Infarcts are well-defined. Review of the MIP images confirms the above findings IMPRESSION: 1. High-grade bilateral proximal ICA stenoses at the carotid bifurcations. 2. 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 3. Hypoplastic left A1 segment. 4. High-grade stenosis of the anterior right M2 segment. 5. Moderate diffuse medium and distal small vessel disease in both the anterior and posterior circulations. 6. Expected evolution of left internal capsule nonhemorrhagic infarct. 7. 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 Electronically Signed   By: Marin Roberts M.D.    On: 02/28/2019 21:01   Dg Chest 2 View  Result Date: 02/07/2019 CLINICAL DATA:  Altered mental status today. EXAM: CHEST - 2 VIEW COMPARISON:  Single-view of the chest 07/16/2006. FINDINGS: Lungs clear. Heart size normal. No pneumothorax or pleural fluid. No acute or focal bony abnormality. IMPRESSION: Negative chest. Electronically Signed   By: Drusilla Kanner M.D.   On: 02/19/2019 16:00   Dg Knee 2 Views Right  Result Date: 02/10/2019 CLINICAL DATA:  Knee pain, initial encounter EXAM: RIGHT KNEE - 2 VIEW COMPARISON:  None. FINDINGS: Mild medial joint space narrowing is noted. No acute fracture or dislocation is seen. Mild patellofemoral spurring is noted as well. IMPRESSION: Mild degenerative change without acute abnormality. Electronically Signed   By: Alcide Clever M.D.   On: 02/18/2019 16:03   Ct Head Wo Contrast  Result Date: 02/10/2019 CLINICAL DATA:  Altered level of consciousness EXAM: CT HEAD WITHOUT CONTRAST TECHNIQUE: Contiguous axial images were obtained from the base of the skull through the vertex without intravenous contrast. COMPARISON:  None. FINDINGS: Brain: Mild atrophic changes are noted. Encephalomalacia changes are seen in the distribution of the right middle cerebral artery consistent with prior infarct. Rounded decreased area of attenuation is noted in the region of the internal capsule on the left suggestive of subacute to chronic ischemia. No focal area of acute infarct or acute hemorrhage is seen. No space-occupying mass lesion is noted. Vascular: No hyperdense vessel or unexpected calcification. Skull: Normal. Negative for fracture or focal lesion. Sinuses/Orbits: No acute finding. Other: None. IMPRESSION: Chronic atrophic changes. 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 Rounded somewhat elongated area of decreased attenuation on the left in the region of the internal capsule and basal ganglia consistent with subacute to chronic ischemia. No acute infarct  is noted. Electronically Signed   By: Alcide Clever M.D.   On: 02/22/2019 16:06   Ct Angio Neck W Or Wo Contrast  Result Date: 02/28/2019 CLINICAL DATA:  Stroke follow-up. Acute/subacute nonhemorrhagic infarct of the left internal capsule  and globus pallidus. EXAM: CT ANGIOGRAPHY HEAD AND NECK TECHNIQUE: Multidetector CT imaging of the head and neck was performed using the standard protocol during bolus administration of intravenous contrast. Multiplanar CT image reconstructions and MIPs were obtained to evaluate the vascular anatomy. Carotid stenosis measurements (when applicable) are obtained utilizing NASCET criteria, using the distal internal carotid diameter as the denominator. CONTRAST:  47mL ISOVUE-370 IOPAMIDOL (ISOVUE-370) INJECTION 76% COMPARISON:  MRI of the brain 02/14/2019 FINDINGS: CT HEAD FINDINGS Brain: The left internal capsule infarct is again noted, now slightly lower density than on the previous CT. No new infarct is present. The remote right MCA territory encephalomalacia is stable. The ventricles are of proportionate to the degree of atrophy. No significant extraaxial fluid collection is present. The brainstem and cerebellum are within normal limits. Vascular: Atherosclerotic calcifications are present within the cavernous internal carotid arteries bilaterally. There is no hyperdense vessel. Skull: The craniocervical junction is normal. Upper cervical spine is within normal limits. Marrow signal is unremarkable. Sinuses: Scattered opacification of left ethmoid air cells are present. There are no fluid levels. The paranasal sinuses and mastoid air cells are otherwise clear. Orbits: The globes and orbits are within normal limits. Review of the MIP images confirms the above findings CTA NECK FINDINGS Aortic arch: A 3 vessel arch configuration is present. Minimal atherosclerotic changes are present at the great vessel origins. There is no significant stenosis or aneurysm. Right carotid system:  The right common carotid artery is within normal limits. Dense calcifications are present at the right carotid bifurcation. There is a high-grade stenosis at the carotid bifurcation. Lumen is narrowed to less than 1 mm. The more distal right common carotid artery is within normal limits to the skull base. Left carotid system: The left common carotid artery demonstrates some atherosclerotic irregularity. There is a high-grade, near occlusive stenosis of the left internal carotid artery at its bifurcation. The cervical left ICA is otherwise normal. Vertebral arteries: Extensive atherosclerotic calcifications are present along the vertebral arteries bilaterally. The right vertebral artery is dominant. There is a high-grade stenosis at the proximal left vertebral artery. No significant stenosis is present in the right vertebral artery. The left vertebral artery is reconstituted at the distal V1 segment. Extensive atherosclerotic changes are present throughout the V2 segment. These are high-grade stenosis at the level of C1. Skeleton: Vertebral body heights alignment are maintained. No focal lytic or blastic lesions are present. Other neck: The soft tissues the neck are otherwise unremarkable. No focal mucosal or submucosal lesions are present. Salivary glands are within normal limits. No significant adenopathy is present. Thyroid is normal. Upper chest: Next mild dependent atelectasis is present. The lung apices are otherwise clear. Thoracic inlet is within normal limits. Review of the MIP images confirms the above findings CTA HEAD FINDINGS Anterior circulation: Atherosclerotic calcifications are present within the cavernous internal carotid arteries bilaterally without a significant stenosis through the ICA termini. The left A1 is hypoplastic. The right A1 is normal. The anterior communicating artery is patent. ACA branch vessels are within normal limits bilaterally. There is a high-grade stenosis of the anterior  right M2 segment with marked attenuation of distal branches. Diffuse irregularity present and more posterior left MCA branches. There is moderate irregularity in left MCA branches. Pial collaterals are evident. Posterior circulation: The right vertebral artery is the dominant vessel. Segmental irregularity is present in the left V4 segment without a significant stenosis. PICA origins are visualized and normal. The vertebrobasilar junction is normal. The basilar artery  is normal. Both posterior cerebral arteries originate from the basilar tip. There is some irregularity of the proximal PCA vessels without significant proximal stenosis. Branch vessels are intact. Venous sinuses: The dural sinuses are patent. Anatomic variants: None Delayed phase: No pathologic enhancement is present. Infarcts are well-defined. Review of the MIP images confirms the above findings IMPRESSION: 1. High-grade bilateral proximal ICA stenoses at the carotid bifurcations. 2. 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 3. Hypoplastic left A1 segment. 4. High-grade stenosis of the anterior right M2 segment. 5. Moderate diffuse medium and distal small vessel disease in both the anterior and posterior circulations. 6. Expected evolution of left internal capsule nonhemorrhagic infarct. 7. 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 Electronically Signed   By: Marin Roberts M.D.   On: 02/28/2019 21:01   [name]  Result Date: 02/23/2019 CLINICAL DATA:  Focal neuro deficit for greater than 6 hours. Altered level of consciousness. EXAM: MRI HEAD WITHOUT CONTRAST TECHNIQUE: Multiplanar, multiecho pulse sequences of the brain and surrounding structures were obtained without intravenous contrast. COMPARISON:  CT head without contrast 01/29/2019 FINDINGS: Brain: The diffusion-weighted images confirm an acute nonhemorrhagic infarct involving the genu of the  left internal capsule and globus pallidus. T2 signal changes are associated with the acute infarct, consistent with the subacute time frame. Remote encephalomalacia is again noted right MCA territory. The ventricles are of proportionate to the degree of atrophy. Wallerian degeneration is present in the right cerebral peduncle extending into the pons. Cerebellum is normal. No significant extraaxial fluid collection is present. Vascular: Flow is present in the major intracranial arteries. Skull and upper cervical spine: The craniocervical junction is normal. Upper cervical spine is within normal limits. Marrow signal is unremarkable. Sinuses/Orbits: The paranasal sinuses and mastoid air cells are clear. Bilateral lens replacements are noted. The globes and orbits are within normal limits. IMPRESSION: 1. 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 This is consistent with a time frame of greater than 6 hours. 2. 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 Electronically Signed   By: Marin Roberts M.D.   On: 02/19/2019 19:18   Dg Foot Complete Right  Result Date: 03/05/2019 CLINICAL DATA:  Right foot pain EXAM: RIGHT FOOT COMPLETE - 3+ VIEW COMPARISON:  None. FINDINGS: Comminuted fracture is noted at the base of the first distal phalanx. This extends into the articular surface in multiple locations. No other fracture is seen. Soft tissue swelling is noted. IMPRESSION: Comminuted fracture of the first distal phalanx which extends to the articular surface. Electronically Signed   By: Alcide Clever M.D.   On: 02/26/2019 16:01   Vas [place] Vanice Sarah With/wo Tbi  Result Date: 03/01/2019 LOWER EXTREMITY DOPPLER STUDY Indications: Gangrene. High Risk Factors: Current smoker.  Comparison Study: No prior study on file Performing Technologist: Sherren Kerns RVS  Examination Guidelines: A complete evaluation includes at minimum,  Doppler waveform signals and systolic blood pressure reading at the level of bilateral brachial, anterior tibial, and posterior tibial arteries, when vessel segments are accessible. Bilateral testing is considered an integral part of a complete examination. Photoelectric Plethysmograph (PPG) waveforms and toe systolic pressure readings are included as required and additional duplex testing as needed. Limited examinations for reoccurring indications may be performed as noted.  ABI Findings: +---------+------------------+-----+-------------------+-----------------------+  Right     Rt Pressure (mmHg) Index Waveform  Comment                  +---------+------------------+-----+-------------------+-----------------------+  Brachial  183                      triphasic                                    +---------+------------------+-----+-------------------+-----------------------+  PTA       67                 0.37  dampened monophasic                          +---------+------------------+-----+-------------------+-----------------------+  DP        74                 0.40  dampened monophasic                          +---------+------------------+-----+-------------------+-----------------------+  Great Toe                                              Not done secondary to                                                            gangrene                 +---------+------------------+-----+-------------------+-----------------------+ +--------+------------------+-----+---------+-------+  Left     Lt Pressure (mmHg) Index Waveform  Comment  +--------+------------------+-----+---------+-------+  Brachial 171                      triphasic          +--------+------------------+-----+---------+-------+  PTA      185                1.01  biphasic           +--------+------------------+-----+---------+-------+  DP       176                0.96  biphasic            +--------+------------------+-----+---------+-------+ +-------+-----------+-----------+------------+------------+  ABI/TBI Today's ABI Today's TBI Previous ABI Previous TBI  +-------+-----------+-----------+------------+------------+  Right   0.40                                               +-------+-----------+-----------+------------+------------+  Left    1.01                                               +-------+-----------+-----------+------------+------------+  Summary: Right: 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 Left: Resting left ankle-brachial index is within normal range. No evidence of significant left lower extremity arterial disease.  *See  table(s) above for measurements and observations.  Electronically signed by Coral ElseVance Brabham MD on 03/01/2019 at 10:49:49 AM.    Final    Vas [place] Carotid  Result Date: 03/06/2019 Carotid Arterial Duplex Study Indications:                           ICA stenosis. Limitations:                           Technically difficult patient due                                        movement, cooperation, and positioning. Pre-Surgical Evaluation & Surgical     Stenosis at bifurcation only. ICA is not Correlation:                           normal past stenosis. Bifurcation is                                        located near the Hyoid Notch. Right sided                                        80 to 99% ICA stenosis originates within                                        the bifurcation and extends approximately                                        1.1 cm into the proximal ICA. There is no                                        apparent hemodynamically significant                                        stenosis involving the mid or distal ICA.                                        Left sided 80 to 99% ICA stenosis                                        originates within the bifurcation and                                         extends approximately 1.1 cm into the  proximal ICA. There is no apparent                                        hemodynamically significant stenosis                                        involving the mid or distal ICA. Performing Technologist: Chanda Busing RVT  Examination Guidelines: A complete evaluation includes B-mode imaging, spectral Doppler, color Doppler, and power Doppler as needed of all accessible portions of each vessel. Bilateral testing is considered an integral part of a complete examination. Limited examinations for reoccurring indications may be performed as noted.  Right Carotid Findings: +----------+--------+--------+--------+--------+--------+             PSV cm/s EDV cm/s Stenosis Describe Comments  +----------+--------+--------+--------+--------+--------+  CCA Distal 63       16                                   +----------+--------+--------+--------+--------+--------+  ICA Prox   397      115      80-99%                      +----------+--------+--------+--------+--------+--------+  ICA Distal 85       31                                   +----------+--------+--------+--------+--------+--------+  Left Carotid Findings: +----------+--------+--------+--------+--------+--------+             PSV cm/s EDV cm/s Stenosis Describe Comments  +----------+--------+--------+--------+--------+--------+  CCA Distal 36       9                                    +----------+--------+--------+--------+--------+--------+  ICA Prox   358      123      80-99%                      +----------+--------+--------+--------+--------+--------+  ICA Distal 106      29                                   +----------+--------+--------+--------+--------+--------+  Summary: Right Carotid: Right sided 80 to 99% ICA stenosis originates within the                bifurcation and extends approximately 1.1 cm into the proximal                ICA. There is no apparent hemodynamically  significant stenosis                involving the mid or distal ICA. Left Carotid: Left sided 80 to 99% ICA stenosis originates within the               bifurcation and extends approximately 1.1 cm into the proximal               ICA. There is no apparent hemodynamically significant stenosis  involving the mid or distal ICA.  *See table(s) above for measurements and observations.  Electronically signed by Coral Else MD on 02/13/2019 at 1:35:19 PM.    Final    US Abdomen Limited Ruq  Result Date: 03/01/2019 CLINICAL DATA:  Elevated LFTs. EXAM: ULTRASOUND ABDOMEN LIMITED RIGHT UPPER QUADRANT COMPARISON:  None. FINDINGS: Gallbladder: Physiologically distended. No gallstones or wall thickening visualized. No sonographic Murphy sign noted by sonographer. Common bile duct: Diameter: 6 mm, normal. Liver: No focal lesion identified. Diffusely increased and heterogeneous in parenchymal echogenicity. Portal vein is patent on color Doppler imaging with normal direction of blood flow towards the liver. IMPRESSION: 1. Hepatic steatosis. 2. Normal sonographic appearance of gallbladder and biliary tree. Electronically Signed   By: Narda Rutherford M.D.   On: 03/01/2019 23:36    Lab Data:  CBC: Recent Labs  Lab 02/26/2019 1450 02/28/19 0338 03/01/19 0348 03/05/2019 0337 02/22/2019 0316 03/04/19 0233  WBC 14.6* 13.9* 12.4* 12.9* 12.6* 15.2*  NEUTROABS 10.9*  --  7.7  --   --   --   HGB 18.4* 16.8 15.5 16.9 13.8 13.3  HCT 55.4* 49.3 46.9 48.2 41.4 38.1*  MCV 88.4 88.5 89.2 86.5 87.7 87.6  PLT 314 274 259 219 253 234   Basic Metabolic Panel: Recent Labs  Lab 02/28/19 0338 03/01/19 0348 03/08/2019 0337 02/10/2019 0316 03/04/19 0233  NA 141 140 136 134* 133*  K 3.8 3.5 3.8 3.2* 3.5  CL 105 103 103 103 104  CO2 23 25 23 23 22   GLUCOSE 213* 123* 143* 156* 141*  BUN 25* 16 13 14 11   CREATININE 1.15 0.99 0.89 1.05 0.92  CALCIUM 8.9 8.6* 8.8* 8.1* 8.2*   GFR: Estimated Creatinine Clearance:  107.9 mL/min (by C-G formula based on SCr of 0.92 mg/dL). Liver Function Tests: Recent Labs  Lab 02/28/2019 1450 02/28/19 0338 03/01/19 0348 02/19/2019 0316 03/04/19 0233  AST 136* 112* 97* 59* 42*  ALT 69* 67* 65* 62* 58*  ALKPHOS 98 79 75 75 62  BILITOT 1.5* 0.9 0.6 0.5 0.6  PROT 7.5 6.2* 6.1* 5.3* 5.3*  ALBUMIN 3.7 3.1* 2.9* 2.4* 2.2*   Recent Labs  Lab 02/22/2019 1450  LIPASE 36   Recent Labs  Lab 02/28/2019 1519  AMMONIA 25   Coagulation Profile: No results for input(s): INR, PROTIME in the last 168 hours. Cardiac Enzymes: Recent Labs  Lab 02/09/2019 1450 03/06/2019 2235 02/28/19 0338 02/28/19 1004 03/01/19 0348  CKTOTAL 3,323*  --  2,223*  --  1,051*  TROPONINI 0.03* <0.03 0.06* 0.03*  --    BNP (last 3 results) No results for input(s): PROBNP in the last 8760 hours. HbA1C: No results for input(s): HGBA1C in the last 72 hours. CBG: Recent Labs  Lab 03/07/2019 1125 02/20/2019 1612 03/03/19 2119 03/04/19 0602 03/04/19 1218  GLUCAP 129* 249* 140* 150* 223*   Lipid Profile: No results for input(s): CHOL, HDL, LDLCALC, TRIG, CHOLHDL, LDLDIRECT in the last 72 hours. Thyroid Function Tests: No results for input(s): TSH, T4TOTAL, FREET4, T3FREE, THYROIDAB in the last 72 hours. Anemia Panel: No results for input(s): VITAMINB12, FOLATE, FERRITIN, TIBC, IRON, RETICCTPCT in the last 72 hours. Urine analysis:    Component Value Date/Time   COLORURINE YELLOW 02/07/2019 1604   APPEARANCEUR CLEAR 03/03/2019 1604   LABSPEC 1.026 02/18/2019 1604   PHURINE 5.0 03/04/2019 1604   GLUCOSEU 150 (A) 02/16/2019 1604   HGBUR SMALL (A) 02/13/2019 1604   BILIRUBINUR NEGATIVE 03/02/2019 1604   KETONESUR 20 (A) 03/01/2019  1604   PROTEINUR NEGATIVE 03-28-2019 1604   NITRITE NEGATIVE 03/28/2019 1604   LEUKOCYTESUR NEGATIVE Mar 28, 2019 1604     Zeinab Rodwell M.D. [facility] 03/06/2019, 10:56 AM  Pager: [phone] Between 7am to 7pm - call Pager - [phone]  After 7pm go  to [email or web address] - password [name]  Call night coverage person covering after 7pm

## 2019-03-07 NOTE — Progress Notes (Signed)
[facility]                                                                              Patient Demographics  Noah Thomas, is a 58 y.o. male, DOB - April 06, 1961, ZOX:[id number]  Admit date - 04-Mar-2019   Admitting Physician Elease Etienne, MD  Outpatient Primary MD for the patient is Patient, No Pcp Per  Outpatient specialists:   LOS - 8  days   Medical records reviewed and are as summarized below:    Chief Complaint  Patient presents with   Altered Mental Status       Brief summary   :58 year old male, lives alone in a mobile home and independent, has not seen a physician in a long time, PMH of significant longstanding mental health issues, not on prescription medications, tobacco abuse, presented to [facility] ED on Mar 04, 2019 via EMS with altered mental status. History obtained from brother. Brother indicated that patient was normal until his teenage years when he started becoming paranoid and agitated while living with his parents, eventually had to be placed in [facility]. Over the years patient has calmed down, able to go to the grocery store for food, cigarettes but has no insight and unable to truly care for himself. He lives in [place] conditions to an extent that when EMS went to retrieve him today, authorities were planning to "condemn" his trailer home. Patient's brother has given him a debit card and provides with the finances. Patient last visited his brother on [birth year] his birthday, at which point he was in his usual state of health except he complained of some toe pain but was managing to ambulate. Since then the brother was trying to reach him by phone for several days and went over to his house a day before the admission but did not get an answer at the door. EMS found the patient on the ground in filthy condition, confused. Patient is unable to provide any history.   ED Course:Noted to be in filthy condition, tachycardic, not febrile, not  hypotensive, leukocytosis, elevated lactate, right toe exam suggestive of possible cellulitis, initiated sepsis protocol and received IV fluids and broad-spectrum IV antibiotics. Orthopedics consulted by EDP and will see him in the morning. WBC 14.6, hemoglobin 18.4, glucose 211, BUN 33, AST 136, ALT 69, total bilirubin 1.5, CK 3000 323, troponin 0 0.03, urine microscopy not suggestive of UTI, chest x-ray negative, CT head suggests subacute to chronic ischemia, right foot x-ray showed comminuted fracture of the first distal phalanx.  Assessment & Plan    Principal Problem:   Right great toe gangrene/ abscess:  - patient was found to have comminuted fracture of the right first distal phalanx extending to the articular surface -Patient was placed on IV vancomycin and cefepime, stopped 24 hours postop per Ortho recommendations.   -Per Dr. Lajoyce Corners,  wound [facility] for 1 week, NWB on R foot -Vascular surgery was consulted, patient underwent bilateral lower extremity arteriogram on 3/25, will eventually need right iliac thrombectomy, right iliac stent and right femoral AKA popliteal bypass after the carotid procedure next week.  Patient was started on heparin drip. -Palliative  medicine was consulted for goals of care.  Per patient and his brother's (HPOA) wishes, he was transitioned to comfort care on 3/26.  Patient did not want any further invasive interventions. 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  -CT head showed chronic atrophy, prior R MCA infarction with encephalomalacia, subacute to chronic left internal capsule/BG infarct -MRI of the brain showed acute/subacute infarct left internal capsule/BG.  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  Neurology recommended aspirin 81 mg and Plavix 75 mg daily for 3 weeks and then  aspirin alone.   -Currently comfort care status per goals of care, awaiting skilled nursing facility  Acute metabolic encephalopathy -Secondary to CVA, acute infection, dehydration, may have underlying psych illness -Per patient's brother, reportedly he had a mental illness since a teenager, was in but not at one point, as an adult never worked more than a week and lives alone.  Patient has been dependent on his family all his adult life for food enlarging. -Per psychiatry recommendations, EKG obtained, QTC improved.  Started on Risperdal -Continue comfort care status per patient's wishes  Diabetes mellitus type 2 -New diagnosis, hemoglobin A1c 7.7.  Patient had not been following with doctors. -Patient was seen by diabetic coordinator, recommended metformin at the time of discharge. -On sliding scale insulin, currently comfort care  Transaminitis -Improving, right upper quadrant ultrasound showed fatty liver  Generalized weakness, acute CVA PT OT consulted, recommended skilled nursing facility  Rhabdomyolysis, acute, mild -Resolved with IV fluids  Goals of care Goals of care meeting on 3/26, management was addressed with patient and his brother by vascular surgery, myself and palliative medicine.  Patient has opted for no further invasive interventions and requested comfort care.  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 03/01/19 1706 02/21/2019 0805   02/28/19 2100  vancomycin (VANCOCIN) 1,000 mg in sodium chloride 0.9 % 250 mL IVPB     1,000 mg 250 mL/hr over 60 Minutes Intravenous Every 12 hours 02/28/19 1707 02/16/2019 1159   02/28/19 0600  vancomycin (VANCOCIN) IVPB 1000 mg/200 mL premix  Status:  Discontinued     1,000 mg 200 mL/hr over 60 Minutes Intravenous Every 12 hours 02/07/2019 1708 02/28/19 1707   02/28/19 0600  ceFEPIme (MAXIPIME) 2 g in sodium chloride 0.9 % 100 mL IVPB     2 g 200 mL/hr over 30 Minutes Intravenous Every 12 hours 02/23/2019 1708 02/10/2019 0959  02/22/2019 1700  vancomycin (VANCOCIN) 1,500 mg in sodium chloride 0.9 % 500 mL IVPB     1,500 mg 250 mL/hr over 120 Minutes Intravenous  Once 02/15/2019 1613 02/11/2019 2250   02/10/2019 1615  ceFEPIme (MAXIPIME) 2 g in sodium chloride 0.9 % 100 mL IVPB     2 g 200 mL/hr over 30 Minutes Intravenous  Once 02/28/2019 1610 02/25/2019 1715   03/07/2019 1615  metroNIDAZOLE (FLAGYL) IVPB 500 mg     500 mg 100 mL/hr over 60 Minutes Intravenous  Once 03/04/2019 1610 02/22/2019 1845   02/25/2019 1615  vancomycin (VANCOCIN) IVPB 1000 mg/200 mL premix  Status:  Discontinued     1,000 mg 200 mL/hr over 60 Minutes Intravenous  Once 02/08/2019 1610 02/11/2019 1613         Medications  Scheduled Meds:  aspirin EC  81 mg Oral Daily   clopidogrel  75 mg Oral Daily   docusate sodium  100 mg Oral BID   Influenza vac split quadrivalent PF  0.5 mL Intramuscular Tomorrow-1000   metoprolol tartrate  5 mg Intravenous Once   metoprolol tartrate  50 mg Oral BID   nicotine  21 mg Transdermal  Daily   pentoxifylline  400 mg Oral TID WC   pneumococcal 23 valent vaccine  0.5 mL Intramuscular Tomorrow-1000   risperiDONE  0.5 mg Oral BID   senna-docusate  2 tablet Oral QHS   Continuous Infusions:  methocarbamol (ROBAXIN) IV     PRN Meds:.acetaminophen **OR** acetaminophen, bisacodyl, hydrALAZINE, HYDROmorphone (DILAUDID) injection, labetalol, magnesium citrate, methocarbamol **OR** methocarbamol (ROBAXIN) IV, ondansetron **OR** ondansetron (ZOFRAN) IV, oxyCODONE, oxyCODONE, polyethylene glycol      Subjective:   Francena HanlyJeffrey Aliberti was seen and examined today.  Comfortable, no acute issues.    Objective:   Vitals:   03/06/19 1411 03/06/19 2138 03/07/19 0423 03/07/19 0424  BP: (!) 147/89 119/76 105/64   Pulse: (!) 104 (!) 108 (!) 108 (!) 110  Resp: 18 16 20    Temp: 98.8 F (37.1 C) 98.3 F (36.8 C) 98.9 F (37.2 C)   TempSrc: Oral Oral Oral   SpO2: 96% 99% 96% 97%  Weight:      Height:        Intake/Output Summary (Last 24 hours) at 03/07/2019 1022 Last data filed at 03/07/2019 0915 Gross per 24 hour  Intake 705 ml  Output --  Net 705 ml     Wt Readings from Last 3 Encounters:  03/04/19 101.5 kg   Physical Exam  General: Alert and awake, comfortable  Eyes:   HEENT:    Cardiovascular: S1 S2 clear, RRR. No pedal edema b/l  Respiratory: CTAB  Gastrointestinal: Soft, nontender, nondistended, NBS  Ext: no pedal edema bilaterally  Neuro:   Musculoskeletal:   Skin: No rashes  Psych: Alert and awake    Data Reviewed:  I have personally reviewed following labs and imaging studies  Micro Results Recent Results (from the past 240 hour(s))  Urine culture     Status: None   Collection Time: 02/12/2019  4:04 PM  Result Value Ref Range Status   Specimen Description URINE, CATHETERIZED  Final   Special Requests Normal  Final   Culture   Final    NO GROWTH Performed at [facility], [address] [place], [place] [address]    Report Status  02/28/2019 FINAL  Final  Surgical pcr screen     Status: None   Collection Time: 03/01/19  5:15 PM  Result Value  Ref Range Status   MRSA, PCR NEGATIVE NEGATIVE Final   Staphylococcus aureus NEGATIVE NEGATIVE Final    Comment: (NOTE) The Xpert SA Assay (FDA approved for NASAL specimens in patients 24 years of age and older), is one component of a comprehensive surveillance program. It is not intended to diagnose infection nor to guide or monitor treatment. Performed at [facility], [address] [place], [place] [address]     Radiology Reports Ct Angio Head W Or Wo Contrast  Result Date: 02/28/2019 CLINICAL DATA:  Stroke follow-up. 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 EXAM: CT ANGIOGRAPHY HEAD AND NECK TECHNIQUE: Multidetector CT imaging of the head and neck was performed using the standard protocol during bolus administration of intravenous contrast. Multiplanar CT image reconstructions and MIPs were obtained to evaluate the vascular anatomy. Carotid stenosis measurements (when applicable) are obtained utilizing NASCET criteria, using the distal internal carotid diameter as the denominator. CONTRAST:  75mL ISOVUE-370 IOPAMIDOL (ISOVUE-370) INJECTION 76% COMPARISON:  MRI of the brain 02/24/2019 FINDINGS: CT HEAD FINDINGS Brain: The left internal capsule infarct is again noted, now slightly lower density than on the previous CT. No new infarct is present. The remote right MCA territory encephalomalacia is stable. The ventricles are of proportionate to the degree of atrophy. No significant extraaxial fluid collection is present. The brainstem and cerebellum are within normal limits. Vascular: Atherosclerotic calcifications are present within the cavernous internal carotid arteries bilaterally. There is no hyperdense vessel. Skull: The craniocervical junction is normal. Upper cervical spine is within normal limits. Marrow signal is  unremarkable. Sinuses: Scattered opacification of left ethmoid air cells are present. There are no fluid levels. The paranasal sinuses and mastoid air cells are otherwise clear. Orbits: The globes and orbits are within normal limits. Review of the MIP images confirms the above findings CTA NECK FINDINGS Aortic arch: A 3 vessel arch configuration is present. Minimal atherosclerotic changes are present at the great vessel origins. There is no significant stenosis or aneurysm. Right carotid system: The right common carotid artery is within normal limits. Dense calcifications are present at the right carotid bifurcation. There is a high-grade stenosis at the carotid bifurcation. Lumen is narrowed to less than 1 mm. The more distal right common carotid artery is within normal limits to the skull base. Left carotid system: The left common carotid artery demonstrates some atherosclerotic irregularity. There is a high-grade, near occlusive stenosis of the left internal carotid artery at its bifurcation. The cervical left ICA is otherwise normal. Vertebral arteries: Extensive atherosclerotic calcifications are present along the vertebral arteries bilaterally. The right vertebral artery is dominant. There is a high-grade stenosis at the proximal left vertebral artery. No significant stenosis is present in the right vertebral artery. The left vertebral artery is reconstituted at the distal V1 segment. Extensive atherosclerotic changes are present throughout the V2 segment. These are high-grade stenosis at the level of C1. Skeleton: Vertebral body heights alignment are maintained. No focal lytic or blastic lesions are present. Other neck: The soft tissues the neck are otherwise unremarkable. No focal mucosal or submucosal lesions are present. Salivary glands are within normal limits. No significant adenopathy is present. Thyroid is normal. Upper chest: Next mild dependent atelectasis is present. The lung apices are otherwise  clear. Thoracic inlet is within normal limits. Review of the MIP images confirms the above findings CTA HEAD FINDINGS Anterior circulation: Atherosclerotic calcifications are present within the cavernous internal carotid arteries bilaterally without a significant stenosis  through the ICA termini. The left A1 is hypoplastic. The right A1 is normal. The anterior communicating artery is patent. ACA branch vessels are within normal limits bilaterally. There is a high-grade stenosis of the anterior right M2 segment with marked attenuation of distal branches. Diffuse irregularity present and more posterior left MCA branches. There is moderate irregularity in left MCA branches. Pial collaterals are evident. Posterior circulation: The right vertebral artery is the dominant vessel. Segmental irregularity is present in the left V4 segment without a significant stenosis. PICA origins are visualized and normal. The vertebrobasilar junction is normal. The basilar artery is normal. Both posterior cerebral arteries originate from the basilar tip. There is some irregularity of the proximal PCA vessels without significant proximal stenosis. Branch vessels are intact. Venous sinuses: The dural sinuses are patent. Anatomic variants: None Delayed phase: No pathologic enhancement is present. Infarcts are well-defined. Review of the MIP images confirms the above findings IMPRESSION: 1. High-grade bilateral proximal ICA stenoses at the carotid bifurcations. 2. 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 3. Hypoplastic left A1 segment. 4. High-grade stenosis of the anterior right M2 segment. 5. Moderate diffuse medium and distal small vessel disease in both the anterior and posterior circulations. 6. Expected evolution of left internal capsule nonhemorrhagic infarct. 7. 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 Electronically Signed   By: Marin Roberts M.D.    On: 02/28/2019 21:01   Dg Chest 2 View  Result Date: 02/26/2019 CLINICAL DATA:  Altered mental status today. EXAM: CHEST - 2 VIEW COMPARISON:  Single-view of the chest 07/16/2006. FINDINGS: Lungs clear. Heart size normal. No pneumothorax or pleural fluid. No acute or focal bony abnormality. IMPRESSION: Negative chest. Electronically Signed   By: Drusilla Kanner M.D.   On: 02/07/2019 16:00   Dg Knee 2 Views Right  Result Date: 03/05/2019 CLINICAL DATA:  Knee pain, initial encounter EXAM: RIGHT KNEE - 2 VIEW COMPARISON:  None. FINDINGS: Mild medial joint space narrowing is noted. No acute fracture or dislocation is seen. Mild patellofemoral spurring is noted as well. IMPRESSION: Mild degenerative change without acute abnormality. Electronically Signed   By: Alcide Clever M.D.   On: 02/07/2019 16:03   Ct Head Wo Contrast  Result Date: 02/09/2019 CLINICAL DATA:  Altered level of consciousness EXAM: CT HEAD WITHOUT CONTRAST TECHNIQUE: Contiguous axial images were obtained from the base of the skull through the vertex without intravenous contrast. COMPARISON:  None. FINDINGS: Brain: Mild atrophic changes are noted. Encephalomalacia changes are seen in the distribution of the right middle cerebral artery consistent with prior infarct. Rounded decreased area of attenuation is noted in the region of the internal capsule on the left suggestive of subacute to chronic ischemia. No focal area of acute infarct or acute hemorrhage is seen. No space-occupying mass lesion is noted. Vascular: No hyperdense vessel or unexpected calcification. Skull: Normal. Negative for fracture or focal lesion. Sinuses/Orbits: No acute finding. Other: None. IMPRESSION: Chronic atrophic changes. 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 Rounded somewhat elongated area of decreased attenuation on the left in the region of the internal capsule and basal ganglia consistent with subacute to chronic ischemia. No acute infarct  is noted. Electronically Signed   By: Alcide Clever M.D.   On: 02/13/2019 16:06   Ct Angio Neck W Or Wo Contrast  Result Date: 02/28/2019 CLINICAL DATA:  Stroke follow-up. 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 EXAM: CT  ANGIOGRAPHY HEAD AND NECK TECHNIQUE: Multidetector CT imaging of the head and neck was performed using the standard protocol during bolus administration of intravenous contrast. Multiplanar CT image reconstructions and MIPs were obtained to evaluate the vascular anatomy. Carotid stenosis measurements (when applicable) are obtained utilizing NASCET criteria, using the distal internal carotid diameter as the denominator. CONTRAST:  65mL ISOVUE-370 IOPAMIDOL (ISOVUE-370) INJECTION 76% COMPARISON:  MRI of the brain 02/16/2019 FINDINGS: CT HEAD FINDINGS Brain: The left internal capsule infarct is again noted, now slightly lower density than on the previous CT. No new infarct is present. The remote right MCA territory encephalomalacia is stable. The ventricles are of proportionate to the degree of atrophy. No significant extraaxial fluid collection is present. The brainstem and cerebellum are within normal limits. Vascular: Atherosclerotic calcifications are present within the cavernous internal carotid arteries bilaterally. There is no hyperdense vessel. Skull: The craniocervical junction is normal. Upper cervical spine is within normal limits. Marrow signal is unremarkable. Sinuses: Scattered opacification of left ethmoid air cells are present. There are no fluid levels. The paranasal sinuses and mastoid air cells are otherwise clear. Orbits: The globes and orbits are within normal limits. Review of the MIP images confirms the above findings CTA NECK FINDINGS Aortic arch: A 3 vessel arch configuration is present. Minimal atherosclerotic changes are present at the great vessel origins. There is no significant stenosis or aneurysm. Right carotid system:  The right common carotid artery is within normal limits. Dense calcifications are present at the right carotid bifurcation. There is a high-grade stenosis at the carotid bifurcation. Lumen is narrowed to less than 1 mm. The more distal right common carotid artery is within normal limits to the skull base. Left carotid system: The left common carotid artery demonstrates some atherosclerotic irregularity. There is a high-grade, near occlusive stenosis of the left internal carotid artery at its bifurcation. The cervical left ICA is otherwise normal. Vertebral arteries: Extensive atherosclerotic calcifications are present along the vertebral arteries bilaterally. The right vertebral artery is dominant. There is a high-grade stenosis at the proximal left vertebral artery. No significant stenosis is present in the right vertebral artery. The left vertebral artery is reconstituted at the distal V1 segment. Extensive atherosclerotic changes are present throughout the V2 segment. These are high-grade stenosis at the level of C1. Skeleton: Vertebral body heights alignment are maintained. No focal lytic or blastic lesions are present. Other neck: The soft tissues the neck are otherwise unremarkable. No focal mucosal or submucosal lesions are present. Salivary glands are within normal limits. No significant adenopathy is present. Thyroid is normal. Upper chest: Next mild dependent atelectasis is present. The lung apices are otherwise clear. Thoracic inlet is within normal limits. Review of the MIP images confirms the above findings CTA HEAD FINDINGS Anterior circulation: Atherosclerotic calcifications are present within the cavernous internal carotid arteries bilaterally without a significant stenosis through the ICA termini. The left A1 is hypoplastic. The right A1 is normal. The anterior communicating artery is patent. ACA branch vessels are within normal limits bilaterally. There is a high-grade stenosis of the anterior  right M2 segment with marked attenuation of distal branches. Diffuse irregularity present and more posterior left MCA branches. There is moderate irregularity in left MCA branches. Pial collaterals are evident. Posterior circulation: The right vertebral artery is the dominant vessel. Segmental irregularity is present in the left V4 segment without a significant stenosis. PICA origins are visualized and normal. The vertebrobasilar junction is normal. The basilar artery is normal. Both posterior cerebral  arteries originate from the basilar tip. There is some irregularity of the proximal PCA vessels without significant proximal stenosis. Branch vessels are intact. Venous sinuses: The dural sinuses are patent. Anatomic variants: None Delayed phase: No pathologic enhancement is present. Infarcts are well-defined. Review of the MIP images confirms the above findings IMPRESSION: 1. High-grade bilateral proximal ICA stenoses at the carotid bifurcations. 2. 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 3. Hypoplastic left A1 segment. 4. High-grade stenosis of the anterior right M2 segment. 5. Moderate diffuse medium and distal small vessel disease in both the anterior and posterior circulations. 6. Expected evolution of left internal capsule nonhemorrhagic infarct. 7. 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 Electronically Signed   By: Marin Roberts M.D.   On: 02/28/2019 21:01   [name]  Result Date: 02/16/2019 CLINICAL DATA:  Focal neuro deficit for greater than 6 hours. Altered level of consciousness. EXAM: MRI HEAD WITHOUT CONTRAST TECHNIQUE: Multiplanar, multiecho pulse sequences of the brain and surrounding structures were obtained without intravenous contrast. COMPARISON:  CT head without contrast 01/29/2019 FINDINGS: Brain: The diffusion-weighted images confirm an acute nonhemorrhagic infarct involving the genu of the  left internal capsule and globus pallidus. T2 signal changes are associated with the acute infarct, consistent with the subacute time frame. Remote encephalomalacia is again noted right MCA territory. The ventricles are of proportionate to the degree of atrophy. Wallerian degeneration is present in the right cerebral peduncle extending into the pons. Cerebellum is normal. No significant extraaxial fluid collection is present. Vascular: Flow is present in the major intracranial arteries. Skull and upper cervical spine: The craniocervical junction is normal. Upper cervical spine is within normal limits. Marrow signal is unremarkable. Sinuses/Orbits: The paranasal sinuses and mastoid air cells are clear. Bilateral lens replacements are noted. The globes and orbits are within normal limits. IMPRESSION: 1. 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 This is consistent with a time frame of greater than 6 hours. 2. 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 Electronically Signed   By: Marin Roberts M.D.   On: 03/05/2019 19:18   Dg Foot Complete Right  Result Date: 03/06/2019 CLINICAL DATA:  Right foot pain EXAM: RIGHT FOOT COMPLETE - 3+ VIEW COMPARISON:  None. FINDINGS: Comminuted fracture is noted at the base of the first distal phalanx. This extends into the articular surface in multiple locations. No other fracture is seen. Soft tissue swelling is noted. IMPRESSION: Comminuted fracture of the first distal phalanx which extends to the articular surface. Electronically Signed   By: Alcide Clever M.D.   On: 03/04/2019 16:01   Vas [place] Vanice Sarah With/wo Tbi  Result Date: 03/01/2019 LOWER EXTREMITY DOPPLER STUDY Indications: Gangrene. High Risk Factors: Current smoker.  Comparison Study: No prior study on file Performing Technologist: Sherren Kerns RVS  Examination Guidelines: A complete evaluation includes at minimum,  Doppler waveform signals and systolic blood pressure reading at the level of bilateral brachial, anterior tibial, and posterior tibial arteries, when vessel segments are accessible. Bilateral testing is considered an integral part of a complete examination. Photoelectric Plethysmograph (PPG) waveforms and toe systolic pressure readings are included as required and additional duplex testing as needed. Limited examinations for reoccurring indications may be performed as noted.  ABI Findings: +---------+------------------+-----+-------------------+-----------------------+  Right     Rt Pressure (mmHg) Index Waveform            Comment                  +---------+------------------+-----+-------------------+-----------------------+  Brachial  183                      triphasic                                    +---------+------------------+-----+-------------------+-----------------------+  PTA       67                 0.37  dampened monophasic                          +---------+------------------+-----+-------------------+-----------------------+  DP        74                 0.40  dampened monophasic                          +---------+------------------+-----+-------------------+-----------------------+  Great Toe                                              Not done secondary to                                                            gangrene                 +---------+------------------+-----+-------------------+-----------------------+ +--------+------------------+-----+---------+-------+  Left     Lt Pressure (mmHg) Index Waveform  Comment  +--------+------------------+-----+---------+-------+  Brachial 171                      triphasic          +--------+------------------+-----+---------+-------+  PTA      185                1.01  biphasic           +--------+------------------+-----+---------+-------+  DP       176                0.96  biphasic            +--------+------------------+-----+---------+-------+ +-------+-----------+-----------+------------+------------+  ABI/TBI Today's ABI Today's TBI Previous ABI Previous TBI  +-------+-----------+-----------+------------+------------+  Right   0.40                                               +-------+-----------+-----------+------------+------------+  Left    1.01                                               +-------+-----------+-----------+------------+------------+  Summary: Right: 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 Left: Resting left ankle-brachial index is within normal range. No evidence of significant left lower extremity arterial disease.  *See table(s) above for measurements and observations.  Electronically signed by Coral Else MD on 03/01/2019 at 10:49:49 AM.  Final    Vas US Carotid  Result Date: 2019-03-05 Carotid Arterial Duplex Study Indications:                           ICA stenosis. Limitations:                           Technically difficult patient due                                        movement, cooperation, and positioning. Pre-Surgical Evaluation & Surgical     Stenosis at bifurcation only. ICA is not Correlation:                           normal past stenosis. Bifurcation is                                        located near the Hyoid Notch. Right sided                                        80 to 99% ICA stenosis originates within                                        the bifurcation and extends approximately                                        1.1 cm into the proximal ICA. There is no                                        apparent hemodynamically significant                                        stenosis involving the mid or distal ICA.                                        Left sided 80 to 99% ICA stenosis                                        originates within the bifurcation and                                         extends approximately 1.1 cm into the  proximal ICA. There is no apparent                                        hemodynamically significant stenosis                                        involving the mid or distal ICA. Performing Technologist: Chanda Busing RVT  Examination Guidelines: A complete evaluation includes B-mode imaging, spectral Doppler, color Doppler, and power Doppler as needed of all accessible portions of each vessel. Bilateral testing is considered an integral part of a complete examination. Limited examinations for reoccurring indications may be performed as noted.  Right Carotid Findings: +----------+--------+--------+--------+--------+--------+             PSV cm/s EDV cm/s Stenosis Describe Comments  +----------+--------+--------+--------+--------+--------+  CCA Distal 63       16                                   +----------+--------+--------+--------+--------+--------+  ICA Prox   397      115      80-99%                      +----------+--------+--------+--------+--------+--------+  ICA Distal 85       31                                   +----------+--------+--------+--------+--------+--------+  Left Carotid Findings: +----------+--------+--------+--------+--------+--------+             PSV cm/s EDV cm/s Stenosis Describe Comments  +----------+--------+--------+--------+--------+--------+  CCA Distal 36       9                                    +----------+--------+--------+--------+--------+--------+  ICA Prox   358      123      80-99%                      +----------+--------+--------+--------+--------+--------+  ICA Distal 106      29                                   +----------+--------+--------+--------+--------+--------+  Summary: Right Carotid: Right sided 80 to 99% ICA stenosis originates within the                bifurcation and extends approximately 1.1 cm into the proximal                ICA. There is no apparent hemodynamically  significant stenosis                involving the mid or distal ICA. Left Carotid: Left sided 80 to 99% ICA stenosis originates within the               bifurcation and extends approximately 1.1 cm into the proximal               ICA. There is no apparent hemodynamically significant stenosis  involving the mid or distal ICA.  *See table(s) above for measurements and observations.  Electronically signed by Coral Else MD on 02/10/2019 at 1:35:19 PM.    Final    US Abdomen Limited Ruq  Result Date: 03/01/2019 CLINICAL DATA:  Elevated LFTs. EXAM: ULTRASOUND ABDOMEN LIMITED RIGHT UPPER QUADRANT COMPARISON:  None. FINDINGS: Gallbladder: Physiologically distended. No gallstones or wall thickening visualized. No sonographic Murphy sign noted by sonographer. Common bile duct: Diameter: 6 mm, normal. Liver: No focal lesion identified. Diffusely increased and heterogeneous in parenchymal echogenicity. Portal vein is patent on color Doppler imaging with normal direction of blood flow towards the liver. IMPRESSION: 1. Hepatic steatosis. 2. Normal sonographic appearance of gallbladder and biliary tree. Electronically Signed   By: Narda Rutherford M.D.   On: 03/01/2019 23:36    Lab Data:  CBC: Recent Labs  Lab 03/01/19 0348 02/12/2019 0337 02/10/2019 0316 03/04/19 0233  WBC 12.4* 12.9* 12.6* 15.2*  NEUTROABS 7.7  --   --   --   HGB 15.5 16.9 13.8 13.3  HCT 46.9 48.2 41.4 38.1*  MCV 89.2 86.5 87.7 87.6  PLT 259 219 253 234   Basic Metabolic Panel: Recent Labs  Lab 03/01/19 0348 02/17/2019 0337 03/09/2019 0316 03/04/19 0233  NA 140 136 134* 133*  K 3.5 3.8 3.2* 3.5  CL 103 103 103 104  CO2 25 23 23 22   GLUCOSE 123* 143* 156* 141*  BUN 16 13 14 11   CREATININE 0.99 0.89 1.05 0.92  CALCIUM 8.6* 8.8* 8.1* 8.2*   GFR: Estimated Creatinine Clearance: 107.9 mL/min (by C-G formula based on SCr of 0.92 mg/dL). Liver Function Tests: Recent Labs  Lab 03/01/19 0348 02/28/2019 0316 03/04/19 0233   AST 97* 59* 42*  ALT 65* 62* 58*  ALKPHOS 75 75 62  BILITOT 0.6 0.5 0.6  PROT 6.1* 5.3* 5.3*  ALBUMIN 2.9* 2.4* 2.2*   No results for input(s): LIPASE, AMYLASE in the last 168 hours. No results for input(s): AMMONIA in the last 168 hours. Coagulation Profile: No results for input(s): INR, PROTIME in the last 168 hours. Cardiac Enzymes: Recent Labs  Lab 03/01/19 0348  CKTOTAL 1,051*   BNP (last 3 results) No results for input(s): PROBNP in the last 8760 hours. HbA1C: No results for input(s): HGBA1C in the last 72 hours. CBG: Recent Labs  Lab 03/05/2019 1125 03/06/2019 1612 02/18/2019 2119 03/04/19 0602 03/04/19 1218  GLUCAP 129* 249* 140* 150* 223*   Lipid Profile: No results for input(s): CHOL, HDL, LDLCALC, TRIG, CHOLHDL, LDLDIRECT in the last 72 hours. Thyroid Function Tests: No results for input(s): TSH, T4TOTAL, FREET4, T3FREE, THYROIDAB in the last 72 hours. Anemia Panel: No results for input(s): VITAMINB12, FOLATE, FERRITIN, TIBC, IRON, RETICCTPCT in the last 72 hours. Urine analysis:    Component Value Date/Time   COLORURINE YELLOW 02/12/2019 1604   APPEARANCEUR CLEAR 02/09/2019 1604   LABSPEC 1.026 02/23/2019 1604   PHURINE 5.0 02/20/2019 1604   GLUCOSEU 150 (A) 02/15/2019 1604   HGBUR SMALL (A) 03/04/2019 1604   BILIRUBINUR NEGATIVE 02/07/2019 1604   KETONESUR 20 (A) 03/06/2019 1604   PROTEINUR NEGATIVE 02/13/2019 1604   NITRITE NEGATIVE 02/28/2019 1604   LEUKOCYTESUR NEGATIVE 02/23/2019 1604     Cordelle Dahmen M.D. [facility] 03/07/2019, 10:22 AM  Pager: [phone] Between 7am to 7pm - call Pager - [phone]  After 7pm go to [email or web address] - password [name]  Call night coverage person covering after 7pm

## 2019-03-08 ENCOUNTER — Encounter (HOSPITAL_COMMUNITY): Admission: EM | Disposition: E | Payer: Self-pay | Source: Home / Self Care | Attending: Internal Medicine

## 2019-03-08 LAB — GLUCOSE, CAPILLARY
Glucose-Capillary: 316 mg/dL — ABNORMAL HIGH (ref 70–99)
Glucose-Capillary: 317 mg/dL — ABNORMAL HIGH (ref 70–99)
Glucose-Capillary: 293 mg/dL — ABNORMAL HIGH (ref 70–99)

## 2019-03-08 SURGERY — TRANSCAROTID ARTERY REVASCULARIZATION (TCAR)
Anesthesia: Choice | Laterality: Left

## 2019-03-08 MED ORDER — INSULIN ASPART 100 UNIT/ML ~~LOC~~ SOLN
0.0000 [IU] | Freq: Every day | SUBCUTANEOUS | Status: DC
  Administered 2019-03-08 – 2019-03-09 (×2): 3 [IU] via SUBCUTANEOUS

## 2019-03-08 MED ORDER — INSULIN ASPART 100 UNIT/ML ~~LOC~~ SOLN
0.0000 [IU] | Freq: Three times a day (TID) | SUBCUTANEOUS | Status: DC
  Administered 2019-03-08 – 2019-03-09 (×2): 11 [IU] via SUBCUTANEOUS
  Administered 2019-03-09: 8 [IU] via SUBCUTANEOUS
  Administered 2019-03-09: 11 [IU] via SUBCUTANEOUS

## 2019-03-08 NOTE — TOC Progression Note (Signed)
Transition of Care [facility]) - Progression Note    Patient Details  Name: Noah Thomas MRN: [id number] Date of Birth: 12/29/1960  [facility] CM/SW Contact  Doy Hutching, [place] Phone Number: 02/08/2019, 2:15 PM  Clinical Narrative:    Pt still without any SNF offers, continue to have pending Medicaid and disability which further complicates placement at this time.   Still continuing to follow.    Expected Discharge Plan: Skilled Nursing Facility Barriers to Discharge: Continued Medical Work up  Expected Discharge Plan and Services Expected Discharge Plan: Skilled Nursing Facility In-house Referral: Clinical Social Work Discharge Planning Services: NA   Living arrangements for the past 2 months: Mobile Home                 DME Arranged: N/A DME Agency: NA HH Arranged: NA HH Agency: NA   Social Determinants of Health (SDOH) Interventions    Readmission Risk Interventions No flowsheet data found.

## 2019-03-08 NOTE — Progress Notes (Addendum)
[facility]                                                                              Patient Demographics  Noah Thomas, is a 58 y.o. male, DOB - 1961-10-07, NWG:[id number]  Admit date - 02/15/2019   Admitting Physician Elease Etienne, MD  Outpatient Primary MD for the patient is Patient, No Pcp Per  Outpatient specialists:   LOS - 9  days   Medical records reviewed and are as summarized below:    Chief Complaint  Patient presents with   Altered Mental Status       Brief summary   :58 year old male, lives alone in a mobile home and independent, has not seen a physician in a long time, PMH of significant longstanding mental health issues, not on prescription medications, tobacco abuse, presented to [facility] ED on 02/22/2019 via EMS with altered mental status. History obtained from brother. Brother indicated that patient was normal until his teenage years when he started becoming paranoid and agitated while living with his parents, eventually had to be placed in [facility]. Over the years patient has calmed down, able to go to the grocery store for food, cigarettes but has no insight and unable to truly care for himself. He lives in [place] conditions to an extent that when EMS went to retrieve him today, authorities were planning to "condemn" his trailer home. Patient's brother has given him a debit card and provides with the finances. Patient last visited his brother on [birth year] his birthday, at which point he was in his usual state of health except he complained of some toe pain but was managing to ambulate. Since then the brother was trying to reach him by phone for several days and went over to his house a day before the admission but did not get an answer at the door. EMS found the patient on the ground in filthy condition, confused. Patient is unable to provide any history.   ED Course:Noted to be in filthy condition, tachycardic, not febrile, not  hypotensive, leukocytosis, elevated lactate, right toe exam suggestive of possible cellulitis, initiated sepsis protocol and received IV fluids and broad-spectrum IV antibiotics. Orthopedics consulted by EDP and will see him in the morning. WBC 14.6, hemoglobin 18.4, glucose 211, BUN 33, AST 136, ALT 69, total bilirubin 1.5, CK 3000 323, troponin 0 0.03, urine microscopy not suggestive of UTI, chest x-ray negative, CT head suggests subacute to chronic ischemia, right foot x-ray showed comminuted fracture of the first distal phalanx.  Assessment & Plan    Principal Problem:   Right great toe gangrene/ abscess:  - patient was found to have comminuted fracture of the right first distal phalanx extending to the articular surface -Patient was placed on IV vancomycin and cefepime, stopped 24 hours postop per Ortho recommendations. Per Dr. Lajoyce Corners,  wound [facility] for 1 week, NWB on R foot -Vascular surgery was consulted, patient underwent bilateral lower extremity arteriogram on 3/25, will eventually need right iliac thrombectomy, right iliac stent and right femoral AKA popliteal bypass after the carotid procedure next week.  Patient was started on heparin drip, now off. -Palliative  medicine was consulted for GOC.  Per patient and his brother's (HPOA) wishes, he was transitioned to comfort care on 3/26.  Patient did not want any further invasive interventions. 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  -CT head showed chronic atrophy, prior R MCA infarction with encephalomalacia, subacute to chronic left internal capsule/BG infarct -MRI of the brain showed acute/subacute infarct left internal capsule/BG.  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  Neurology recommended aspirin 81 mg and  Plavix 75 mg daily for 3 weeks and then aspirin alone.   -Awaiting skilled nursing facility  Acute metabolic encephalopathy -Secondary to CVA, acute infection, dehydration, may have underlying psych illness -Per patient's brother, reportedly he had a mental illness since a teenager, was in but not at one point, as an adult never worked more than a week and lives alone.  Patient has been dependent on his family all his adult life for food enlarging. -Per psychiatry recommendations, EKG obtained, QTC improved.  Started on Risperdal -Continue comfort care status per patient's wishes  Diabetes mellitus type 2 -New diagnosis, hemoglobin A1c 7.7.  Patient had not been following with doctors. -Patient was seen by diabetic coordinator, recommended metformin at the time of discharge. -On sliding scale insulin, currently comfort care  Transaminitis -Improving, right upper quadrant ultrasound showed fatty liver  Generalized weakness, acute CVA Awaiting skilled nursing facility, comfort care  Rhabdomyolysis, acute, mild -Resolved with IV fluids  Goals of care Goals of care meeting on 3/26, management was addressed with patient and his brother by vascular surgery, myself and palliative medicine.  Patient has opted for no further invasive interventions and requested comfort care.  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 03/01/19 1706 03/05/2019 0805   02/28/19 2100  vancomycin (VANCOCIN) 1,000 mg in sodium chloride 0.9 % 250 mL IVPB     1,000 mg 250 mL/hr over 60 Minutes Intravenous Every 12 hours 02/28/19 1707 02/25/2019 1159   02/28/19 0600  vancomycin (VANCOCIN) IVPB 1000 mg/200 mL premix  Status:  Discontinued     1,000 mg 200 mL/hr over 60 Minutes Intravenous Every 12 hours 02/20/2019 1708 02/28/19 1707   02/28/19 0600  ceFEPIme (MAXIPIME) 2 g in sodium chloride 0.9 % 100 mL IVPB     2 g 200 mL/hr over 30 Minutes Intravenous Every 12 hours 02/22/2019 1708 03/07/2019 0959   02/07/2019 1700  vancomycin (VANCOCIN) 1,500 mg in sodium chloride 0.9 % 500 mL  IVPB     1,500 mg 250 mL/hr over 120 Minutes Intravenous  Once 03/04/2019 1613 03/09/2019 2250   02/10/2019 1615  ceFEPIme (MAXIPIME) 2 g in sodium chloride 0.9 % 100 mL IVPB     2 g 200 mL/hr over 30 Minutes Intravenous  Once 03/04/2019 1610 02/12/2019 1715   03/06/2019 1615  metroNIDAZOLE (FLAGYL) IVPB 500 mg     500 mg 100 mL/hr over 60 Minutes Intravenous  Once 02/18/2019 1610 03/06/2019 1845   02/10/2019 1615  vancomycin (VANCOCIN) IVPB 1000 mg/200 mL premix  Status:  Discontinued     1,000 mg 200 mL/hr over 60 Minutes Intravenous  Once 02/09/2019 1610 03/05/2019 1613         Medications  Scheduled Meds:  aspirin EC  81 mg Oral Daily   clopidogrel  75 mg Oral Daily   docusate sodium  100 mg Oral BID   Influenza vac split quadrivalent PF  0.5 mL Intramuscular Tomorrow-1000   metoprolol tartrate  5 mg Intravenous Once   metoprolol tartrate  50 mg Oral BID   nicotine  21 mg Transdermal Daily   pentoxifylline  400 mg Oral TID WC   pneumococcal 23 valent vaccine  0.5 mL  Intramuscular Tomorrow-1000   risperiDONE  0.5 mg Oral BID   senna-docusate  2 tablet Oral QHS   Continuous Infusions:  methocarbamol (ROBAXIN) IV     PRN Meds:.acetaminophen **OR** acetaminophen, bisacodyl, hydrALAZINE, HYDROmorphone (DILAUDID) injection, labetalol, magnesium citrate, methocarbamol **OR** methocarbamol (ROBAXIN) IV, ondansetron **OR** ondansetron (ZOFRAN) IV, oxyCODONE, oxyCODONE, polyethylene glycol      Subjective:   Ranald Alessio was seen and examined today.  Comfortable, snoring, no acute issues overnight.  No fevers.    Objective:   Vitals:   03/07/19 0424 03/07/19 1424 03/07/19 2155 02/23/2019 0520  BP:  104/69 122/67 126/70  Pulse: (!) 110 (!) 106 (!) 106 (!) 102  Resp:  Temp:  98.9 F (37.2 C) 98.7 F (37.1 C) 98.1 F (36.7 C)  TempSrc:  Oral Oral Oral  SpO2: 97% 97% 98% 100%  Weight:      Height:        Intake/Output Summary (Last 24 hours) at 02/07/2019 1048 Last data filed at 03/06/2019 0650 Gross per 24 hour  Intake 180 ml  Output 200 ml  Net -20 ml     Wt Readings from Last 3 Encounters:  03/04/19 101.5 kg   Physical Exam  General: Sleepy, comfortable  Eyes:   HEENT:   Cardiovascular: S1 S2 clear, RRR. No pedal edema b/l  Respiratory: CTAB  Gastrointestinal: Soft, nontender, nondistended, NBS  Ext: no pedal edema bilaterally  Neuro:   Musculoskeletal:   Skin:   Psych:     Data Reviewed:  I have personally reviewed following labs and imaging studies  Micro Results Recent Results (from the past 240 hour(s))  Urine culture     Status: None   Collection Time: 02/22/2019  4:04 PM  Result Value Ref Range Status   Specimen Description URINE, CATHETERIZED  Final   Special Requests Normal  Final   Culture   Final    NO GROWTH Performed at [facility], [address], [place], [place] [address]    Report Status 02/28/2019 FINAL  Final  Surgical pcr screen     Status: None   Collection Time:  03/01/19  5:15 PM  Result Value Ref Range Status   MRSA, PCR NEGATIVE NEGATIVE Final   Staphylococcus aureus NEGATIVE  NEGATIVE Final    Comment: (NOTE) The Xpert SA Assay (FDA approved for NASAL specimens in patients 68 years of age and older), is one component of a comprehensive surveillance program. It is not intended to diagnose infection nor to guide or monitor treatment. Performed at [facility], [address], [place], [place] [address]     Radiology Reports Ct Angio Head W Or Wo Contrast  Result Date: 02/28/2019 CLINICAL DATA:  Stroke follow-up. 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 EXAM: CT ANGIOGRAPHY HEAD AND NECK TECHNIQUE: Multidetector CT imaging of the head and neck was performed using the standard protocol during bolus administration of intravenous contrast. Multiplanar CT image reconstructions and MIPs were obtained to evaluate the vascular anatomy. Carotid stenosis measurements (when applicable) are obtained utilizing NASCET criteria, using the distal internal carotid diameter as the denominator. CONTRAST:  75mL ISOVUE-370 IOPAMIDOL (ISOVUE-370) INJECTION 76% COMPARISON:  MRI of the brain 02/11/2019 FINDINGS: CT HEAD FINDINGS Brain: The left internal capsule infarct is again noted, now slightly lower density than on the previous CT. No new infarct is present. The remote right MCA territory encephalomalacia is stable. The ventricles are of proportionate to the degree of atrophy. No significant extraaxial fluid collection is present. The brainstem and cerebellum are within normal limits. Vascular: Atherosclerotic calcifications are present within the cavernous internal carotid arteries bilaterally. There is no hyperdense vessel. Skull: The craniocervical junction is normal. Upper cervical spine is within normal limits. Marrow signal is unremarkable. Sinuses: Scattered opacification of left ethmoid air cells are present. There  are no fluid levels. The paranasal sinuses and mastoid air cells are otherwise clear. Orbits: The globes and orbits are within normal limits. Review of the MIP images confirms the above findings CTA NECK FINDINGS Aortic arch: A 3 vessel arch configuration is present. Minimal atherosclerotic changes are present at the great vessel origins. There is no significant stenosis or aneurysm. Right carotid system: The right common carotid artery is within normal limits. Dense calcifications are present at the right carotid bifurcation. There is a high-grade stenosis at the carotid bifurcation. Lumen is narrowed to less than 1 mm. The more distal right common carotid artery is within normal limits to the skull base. Left carotid system: The left common carotid artery demonstrates some atherosclerotic irregularity. There is a high-grade, near occlusive stenosis of the left internal carotid artery at its bifurcation. The cervical left ICA is otherwise normal. Vertebral arteries: Extensive atherosclerotic calcifications are present along the vertebral arteries bilaterally. The right vertebral artery is dominant. There is a high-grade stenosis at the proximal left vertebral artery. No significant stenosis is present in the right vertebral artery. The left vertebral artery is reconstituted at the distal V1 segment. Extensive atherosclerotic changes are present throughout the V2 segment. These are high-grade stenosis at the level of C1. Skeleton: Vertebral body heights alignment are maintained. No focal lytic or blastic lesions are present. Other neck: The soft tissues the neck are otherwise unremarkable. No focal mucosal or submucosal lesions are present. Salivary glands are within normal limits. No significant adenopathy is present. Thyroid is normal. Upper chest: Next mild dependent atelectasis is present. The lung apices are otherwise clear. Thoracic inlet is within normal limits. Review of the MIP images confirms the above  findings CTA HEAD FINDINGS Anterior circulation: Atherosclerotic calcifications are present within the cavernous internal carotid arteries bilaterally without a significant stenosis through the ICA termini. The left A1 is hypoplastic. The right A1 is normal. The  anterior communicating artery is patent. ACA branch vessels are within normal limits bilaterally. There is a high-grade stenosis of the anterior right M2 segment with marked attenuation of distal branches. Diffuse irregularity present and more posterior left MCA branches. There is moderate irregularity in left MCA branches. Pial collaterals are evident. Posterior circulation: The right vertebral artery is the dominant vessel. Segmental irregularity is present in the left V4 segment without a significant stenosis. PICA origins are visualized and normal. The vertebrobasilar junction is normal. The basilar artery is normal. Both posterior cerebral arteries originate from the basilar tip. There is some irregularity of the proximal PCA vessels without significant proximal stenosis. Branch vessels are intact. Venous sinuses: The dural sinuses are patent. Anatomic variants: None Delayed phase: No pathologic enhancement is present. Infarcts are well-defined. Review of the MIP images confirms the above findings IMPRESSION: 1. High-grade bilateral proximal ICA stenoses at the carotid bifurcations. 2. 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 3. Hypoplastic left A1 segment. 4. High-grade stenosis of the anterior right M2 segment. 5. Moderate diffuse medium and distal small vessel disease in both the anterior and posterior circulations. 6. Expected evolution of left internal capsule nonhemorrhagic infarct. 7. 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 Electronically Signed   By: Marin Roberts M.D.   On: 02/28/2019 21:01   Dg Chest 2 View  Result Date: 02/07/2019 CLINICAL DATA:   Altered mental status today. EXAM: CHEST - 2 VIEW COMPARISON:  Single-view of the chest 07/16/2006. FINDINGS: Lungs clear. Heart size normal. No pneumothorax or pleural fluid. No acute or focal bony abnormality. IMPRESSION: Negative chest. Electronically Signed   By: Drusilla Kanner M.D.   On: 02/20/2019 16:00   Dg Knee 2 Views Right  Result Date: 02/13/2019 CLINICAL DATA:  Knee pain, initial encounter EXAM: RIGHT KNEE - 2 VIEW COMPARISON:  None. FINDINGS: Mild medial joint space narrowing is noted. No acute fracture or dislocation is seen. Mild patellofemoral spurring is noted as well. IMPRESSION: Mild degenerative change without acute abnormality. Electronically Signed   By: Alcide Clever M.D.   On: 02/20/2019 16:03   Ct Head Wo Contrast  Result Date: 02/25/2019 CLINICAL DATA:  Altered level of consciousness EXAM: CT HEAD WITHOUT CONTRAST TECHNIQUE: Contiguous axial images were obtained from the base of the skull through the vertex without intravenous contrast. COMPARISON:  None. FINDINGS: Brain: Mild atrophic changes are noted. Encephalomalacia changes are seen in the distribution of the right middle cerebral artery consistent with prior infarct. Rounded decreased area of attenuation is noted in the region of the internal capsule on the left suggestive of subacute to chronic ischemia. No focal area of acute infarct or acute hemorrhage is seen. No space-occupying mass lesion is noted. Vascular: No hyperdense vessel or unexpected calcification. Skull: Normal. Negative for fracture or focal lesion. Sinuses/Orbits: No acute finding. Other: None. IMPRESSION: Chronic atrophic changes. 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 Rounded somewhat elongated area of decreased attenuation on the left in the region of the internal capsule and basal ganglia consistent with subacute to chronic ischemia. No acute infarct is noted. Electronically Signed   By: Alcide Clever M.D.   On: 03/04/2019 16:06   Ct  Angio Neck W Or Wo Contrast  Result Date: 02/28/2019 CLINICAL DATA:  Stroke follow-up. 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 EXAM: CT ANGIOGRAPHY HEAD AND NECK TECHNIQUE: Multidetector CT imaging of the head and neck was performed  using the standard protocol during bolus administration of intravenous contrast. Multiplanar CT image reconstructions and MIPs were obtained to evaluate the vascular anatomy. Carotid stenosis measurements (when applicable) are obtained utilizing NASCET criteria, using the distal internal carotid diameter as the denominator. CONTRAST:  75mL ISOVUE-370 IOPAMIDOL (ISOVUE-370) INJECTION 76% COMPARISON:  MRI of the brain 02/24/2019 FINDINGS: CT HEAD FINDINGS Brain: The left internal capsule infarct is again noted, now slightly lower density than on the previous CT. No new infarct is present. The remote right MCA territory encephalomalacia is stable. The ventricles are of proportionate to the degree of atrophy. No significant extraaxial fluid collection is present. The brainstem and cerebellum are within normal limits. Vascular: Atherosclerotic calcifications are present within the cavernous internal carotid arteries bilaterally. There is no hyperdense vessel. Skull: The craniocervical junction is normal. Upper cervical spine is within normal limits. Marrow signal is unremarkable. Sinuses: Scattered opacification of left ethmoid air cells are present. There are no fluid levels. The paranasal sinuses and mastoid air cells are otherwise clear. Orbits: The globes and orbits are within normal limits. Review of the MIP images confirms the above findings CTA NECK FINDINGS Aortic arch: A 3 vessel arch configuration is present. Minimal atherosclerotic changes are present at the great vessel origins. There is no significant stenosis or aneurysm. Right carotid system: The right common carotid artery is within normal limits. Dense calcifications are  present at the right carotid bifurcation. There is a high-grade stenosis at the carotid bifurcation. Lumen is narrowed to less than 1 mm. The more distal right common carotid artery is within normal limits to the skull base. Left carotid system: The left common carotid artery demonstrates some atherosclerotic irregularity. There is a high-grade, near occlusive stenosis of the left internal carotid artery at its bifurcation. The cervical left ICA is otherwise normal. Vertebral arteries: Extensive atherosclerotic calcifications are present along the vertebral arteries bilaterally. The right vertebral artery is dominant. There is a high-grade stenosis at the proximal left vertebral artery. No significant stenosis is present in the right vertebral artery. The left vertebral artery is reconstituted at the distal V1 segment. Extensive atherosclerotic changes are present throughout the V2 segment. These are high-grade stenosis at the level of C1. Skeleton: Vertebral body heights alignment are maintained. No focal lytic or blastic lesions are present. Other neck: The soft tissues the neck are otherwise unremarkable. No focal mucosal or submucosal lesions are present. Salivary glands are within normal limits. No significant adenopathy is present. Thyroid is normal. Upper chest: Next mild dependent atelectasis is present. The lung apices are otherwise clear. Thoracic inlet is within normal limits. Review of the MIP images confirms the above findings CTA HEAD FINDINGS Anterior circulation: Atherosclerotic calcifications are present within the cavernous internal carotid arteries bilaterally without a significant stenosis through the ICA termini. The left A1 is hypoplastic. The right A1 is normal. The anterior communicating artery is patent. ACA branch vessels are within normal limits bilaterally. There is a high-grade stenosis of the anterior right M2 segment with marked attenuation of distal branches. Diffuse irregularity  present and more posterior left MCA branches. There is moderate irregularity in left MCA branches. Pial collaterals are evident. Posterior circulation: The right vertebral artery is the dominant vessel. Segmental irregularity is present in the left V4 segment without a significant stenosis. PICA origins are visualized and normal. The vertebrobasilar junction is normal. The basilar artery is normal. Both posterior cerebral arteries originate from the basilar tip. There is some irregularity of the proximal PCA vessels  without significant proximal stenosis. Branch vessels are intact. Venous sinuses: The dural sinuses are patent. Anatomic variants: None Delayed phase: No pathologic enhancement is present. Infarcts are well-defined. Review of the MIP images confirms the above findings IMPRESSION: 1. High-grade bilateral proximal ICA stenoses at the carotid bifurcations. 2. 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 3. Hypoplastic left A1 segment. 4. High-grade stenosis of the anterior right M2 segment. 5. Moderate diffuse medium and distal small vessel disease in both the anterior and posterior circulations. 6. Expected evolution of left internal capsule nonhemorrhagic infarct. 7. 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 Electronically Signed   By: Marin Roberts M.D.   On: 02/28/2019 21:01   [name]  Result Date: Mar 22, 2019 CLINICAL DATA:  Focal neuro deficit for greater than 6 hours. Altered level of consciousness. EXAM: MRI HEAD WITHOUT CONTRAST TECHNIQUE: Multiplanar, multiecho pulse sequences of the brain and surrounding structures were obtained without intravenous contrast. COMPARISON:  CT head without contrast 01/29/2019 FINDINGS: Brain: The diffusion-weighted images confirm an acute nonhemorrhagic infarct involving the genu of the left internal capsule and globus pallidus. T2 signal changes are associated with  the acute infarct, consistent with the subacute time frame. Remote encephalomalacia is again noted right MCA territory. The ventricles are of proportionate to the degree of atrophy. Wallerian degeneration is present in the right cerebral peduncle extending into the pons. Cerebellum is normal. No significant extraaxial fluid collection is present. Vascular: Flow is present in the major intracranial arteries. Skull and upper cervical spine: The craniocervical junction is normal. Upper cervical spine is within normal limits. Marrow signal is unremarkable. Sinuses/Orbits: The paranasal sinuses and mastoid air cells are clear. Bilateral lens replacements are noted. The globes and orbits are within normal limits. IMPRESSION: 1. 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 This is consistent with a time frame of greater than 6 hours. 2. 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 Electronically Signed   By: Marin Roberts M.D.   On: 03/22/19 19:18   Dg Foot Complete Right  Result Date: 03/22/2019 CLINICAL DATA:  Right foot pain EXAM: RIGHT FOOT COMPLETE - 3+ VIEW COMPARISON:  None. FINDINGS: Comminuted fracture is noted at the base of the first distal phalanx. This extends into the articular surface in multiple locations. No other fracture is seen. Soft tissue swelling is noted. IMPRESSION: Comminuted fracture of the first distal phalanx which extends to the articular surface. Electronically Signed   By: Alcide Clever M.D.   On: 03-22-2019 16:01   Vas [place] Vanice Sarah With/wo Tbi  Result Date: 03/01/2019 LOWER EXTREMITY DOPPLER STUDY Indications: Gangrene. High Risk Factors: Current smoker.  Comparison Study: No prior study on file Performing Technologist: Sherren Kerns RVS  Examination Guidelines: A complete evaluation includes at minimum, Doppler waveform signals and systolic blood pressure reading at the level of  bilateral brachial, anterior tibial, and posterior tibial arteries, when vessel segments are accessible. Bilateral testing is considered an integral part of a complete examination. Photoelectric Plethysmograph (PPG) waveforms and toe systolic pressure readings are included as required and additional duplex testing as needed. Limited examinations for reoccurring indications may be performed as noted.  ABI Findings: +---------+------------------+-----+-------------------+-----------------------+  Right     Rt Pressure (mmHg) Index Waveform            Comment                  +---------+------------------+-----+-------------------+-----------------------+  Brachial  183                      triphasic                                    +---------+------------------+-----+-------------------+-----------------------+  PTA       67                 0.37  dampened monophasic                          +---------+------------------+-----+-------------------+-----------------------+  DP        74                 0.40  dampened monophasic                          +---------+------------------+-----+-------------------+-----------------------+  Great Toe                                              Not done secondary to                                                            gangrene                 +---------+------------------+-----+-------------------+-----------------------+ +--------+------------------+-----+---------+-------+  Left     Lt Pressure (mmHg) Index Waveform  Comment  +--------+------------------+-----+---------+-------+  Brachial 171                      triphasic          +--------+------------------+-----+---------+-------+  PTA      185                1.01  biphasic           +--------+------------------+-----+---------+-------+  DP       176                0.96  biphasic           +--------+------------------+-----+---------+-------+ +-------+-----------+-----------+------------+------------+   ABI/TBI Today's ABI Today's TBI Previous ABI Previous TBI  +-------+-----------+-----------+------------+------------+  Right   0.40                                               +-------+-----------+-----------+------------+------------+  Left    1.01                                               +-------+-----------+-----------+------------+------------+  Summary: Right: 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 Left: Resting left ankle-brachial index is within normal range. No evidence of significant left lower extremity arterial disease.  *See table(s) above for measurements and observations.  Electronically signed by Coral Else MD on 03/01/2019 at 10:49:49 AM.  Final    Vas US Carotid  Result Date: 02/21/2019 Carotid Arterial Duplex Study Indications:                           ICA stenosis. Limitations:                           Technically difficult patient due                                        movement, cooperation, and positioning. Pre-Surgical Evaluation & Surgical     Stenosis at bifurcation only. ICA is not Correlation:                           normal past stenosis. Bifurcation is                                        located near the Hyoid Notch. Right sided                                        80 to 99% ICA stenosis originates within                                        the bifurcation and extends approximately                                        1.1 cm into the proximal ICA. There is no                                        apparent hemodynamically significant                                        stenosis involving the mid or distal ICA.                                        Left sided 80 to 99% ICA stenosis                                        originates within the bifurcation and                                        extends approximately 1.1 cm into the  proximal ICA. There is no  apparent                                        hemodynamically significant stenosis                                        involving the mid or distal ICA. Performing Technologist: Chanda Busing RVT  Examination Guidelines: A complete evaluation includes B-mode imaging, spectral Doppler, color Doppler, and power Doppler as needed of all accessible portions of each vessel. Bilateral testing is considered an integral part of a complete examination. Limited examinations for reoccurring indications may be performed as noted.  Right Carotid Findings: +----------+--------+--------+--------+--------+--------+             PSV cm/s EDV cm/s Stenosis Describe Comments  +----------+--------+--------+--------+--------+--------+  CCA Distal 63       16                                   +----------+--------+--------+--------+--------+--------+  ICA Prox   397      115      80-99%                      +----------+--------+--------+--------+--------+--------+  ICA Distal 85       31                                   +----------+--------+--------+--------+--------+--------+  Left Carotid Findings: +----------+--------+--------+--------+--------+--------+             PSV cm/s EDV cm/s Stenosis Describe Comments  +----------+--------+--------+--------+--------+--------+  CCA Distal 36       9                                    +----------+--------+--------+--------+--------+--------+  ICA Prox   358      123      80-99%                      +----------+--------+--------+--------+--------+--------+  ICA Distal 106      29                                   +----------+--------+--------+--------+--------+--------+  Summary: Right Carotid: Right sided 80 to 99% ICA stenosis originates within the                bifurcation and extends approximately 1.1 cm into the proximal                ICA. There is no apparent hemodynamically significant stenosis                involving the mid or distal ICA. Left Carotid: Left sided 80 to 99% ICA  stenosis originates within the               bifurcation and extends approximately 1.1 cm into the proximal               ICA. There is no apparent hemodynamically significant  stenosis               involving the mid or distal ICA.  *See table(s) above for measurements and observations.  Electronically signed by Coral Else MD on 02/07/2019 at 1:35:19 PM.    Final    US Abdomen Limited Ruq  Result Date: 03/01/2019 CLINICAL DATA:  Elevated LFTs. EXAM: ULTRASOUND ABDOMEN LIMITED RIGHT UPPER QUADRANT COMPARISON:  None. FINDINGS: Gallbladder: Physiologically distended. No gallstones or wall thickening visualized. No sonographic Murphy sign noted by sonographer. Common bile duct: Diameter: 6 mm, normal. Liver: No focal lesion identified. Diffusely increased and heterogeneous in parenchymal echogenicity. Portal vein is patent on color Doppler imaging with normal direction of blood flow towards the liver. IMPRESSION: 1. Hepatic steatosis. 2. Normal sonographic appearance of gallbladder and biliary tree. Electronically Signed   By: Narda Rutherford M.D.   On: 03/01/2019 23:36    Lab Data:  CBC: Recent Labs  Lab 02/19/2019 0337 02/21/2019 0316 03/04/19 0233  WBC 12.9* 12.6* 15.2*  HGB 16.9 13.8 13.3  HCT 48.2 41.4 38.1*  MCV 86.5 87.7 87.6  PLT 219 253 234   Basic Metabolic Panel: Recent Labs  Lab 02/09/2019 0337 03/01/2019 0316 03/04/19 0233  NA 136 134* 133*  K 3.8 3.2* 3.5  CL 103 103 104  CO2 23 23 22   GLUCOSE 143* 156* 141*  BUN 13 14 11   CREATININE 0.89 1.05 0.92  CALCIUM 8.8* 8.1* 8.2*   GFR: Estimated Creatinine Clearance: 107.9 mL/min (by C-G formula based on SCr of 0.92 mg/dL). Liver Function Tests: Recent Labs  Lab 02/13/2019 0316 03/04/19 0233  AST 59* 42*  ALT 62* 58*  ALKPHOS 75 62  BILITOT 0.5 0.6  PROT 5.3* 5.3*  ALBUMIN 2.4* 2.2*   No results for input(s): LIPASE, AMYLASE in the last 168 hours. No results for input(s): AMMONIA in the last 168 hours. Coagulation  Profile: No results for input(s): INR, PROTIME in the last 168 hours. Cardiac Enzymes: No results for input(s): CKTOTAL, CKMB, CKMBINDEX, TROPONINI in the last 168 hours. BNP (last 3 results) No results for input(s): PROBNP in the last 8760 hours. HbA1C: No results for input(s): HGBA1C in the last 72 hours. CBG: Recent Labs  Lab 02/07/2019 1125 02/24/2019 1612 02/21/2019 2119 03/04/19 0602 03/04/19 1218  GLUCAP 129* 249* 140* 150* 223*   Lipid Profile: No results for input(s): CHOL, HDL, LDLCALC, TRIG, CHOLHDL, LDLDIRECT in the last 72 hours. Thyroid Function Tests: No results for input(s): TSH, T4TOTAL, FREET4, T3FREE, THYROIDAB in the last 72 hours. Anemia Panel: No results for input(s): VITAMINB12, FOLATE, FERRITIN, TIBC, IRON, RETICCTPCT in the last 72 hours. Urine analysis:    Component Value Date/Time   COLORURINE YELLOW 02/21/2019 1604   APPEARANCEUR CLEAR 02/24/2019 1604   LABSPEC 1.026 02/23/2019 1604   PHURINE 5.0 02/19/2019 1604   GLUCOSEU 150 (A) 02/28/2019 1604   HGBUR SMALL (A) 02/07/2019 1604   BILIRUBINUR NEGATIVE 02/16/2019 1604   KETONESUR 20 (A) 02/12/2019 1604   PROTEINUR NEGATIVE 02/13/2019 1604   NITRITE NEGATIVE 02/24/2019 1604   LEUKOCYTESUR NEGATIVE 02/19/2019 1604     Shea Swalley M.D. [facility] 03/30/2019, 10:48 AM  Pager: [phone] Between 7am to 7pm - call Pager - [phone]  After 7pm go to [email or web address] - password [name]  Call night coverage person covering after 7pm

## 2019-03-09 LAB — GLUCOSE, CAPILLARY
Glucose-Capillary: 307 mg/dL — ABNORMAL HIGH (ref 70–99)
Glucose-Capillary: 297 mg/dL — ABNORMAL HIGH (ref 70–99)
Glucose-Capillary: 322 mg/dL — ABNORMAL HIGH (ref 70–99)
Glucose-Capillary: 296 mg/dL — ABNORMAL HIGH (ref 70–99)

## 2019-03-09 MED ORDER — SCOPOLAMINE 1 MG/3DAYS TD PT72
1.0000 | MEDICATED_PATCH | TRANSDERMAL | Status: DC
  Administered 2019-03-09: 1.5 mg via TRANSDERMAL
  Filled 2019-03-09: qty 1

## 2019-03-09 MED ORDER — INSULIN GLARGINE 100 UNIT/ML ~~LOC~~ SOLN
7.0000 [IU] | Freq: Every day | SUBCUTANEOUS | Status: DC
  Administered 2019-03-09: 7 [IU] via SUBCUTANEOUS
  Filled 2019-03-09 (×3): qty 0.07

## 2019-03-09 MED ORDER — OXYCODONE HCL 5 MG PO TABS: 5.0000 mg | ORAL_TABLET | ORAL | Status: DC | PRN

## 2019-03-09 MED ORDER — MORPHINE SULFATE (PF) 2 MG/ML IV SOLN: 1.0000 mg | INTRAVENOUS | Status: DC | PRN

## 2019-03-09 MED ORDER — MORPHINE SULFATE 10 MG/5ML PO SOLN
2.5000 mg | ORAL | Status: DC | PRN
  Administered 2019-03-09 (×2): 2.5 mg via ORAL
  Filled 2019-03-09 (×2): qty 2

## 2019-03-09 NOTE — TOC Progression Note (Signed)
Transition of Care [facility]) - Progression Note    Patient Details  Name: Noah Thomas MRN: [id number] Date of Birth: 04-24-1961  [facility] of [facility] CM/SW Contact  Doy Hutching, [place] Phone Number: 03/09/2019, 3:11 PM  Clinical Narrative:    Aware of change in pt status, await PMT consult and revisit to understand if pt more appropriate for residential hospice now.   Expected Discharge Plan: Skilled Nursing Facility Barriers to Discharge: Continued Medical Work up  Expected Discharge Plan and Services Expected Discharge Plan: Skilled Nursing Facility In-house Referral: Clinical Social Work Discharge Planning Services: NA   Living arrangements for the past 2 months: Mobile Home                 DME Arranged: N/A DME Agency: NA HH Arranged: NA HH Agency: NA   Social Determinants of Health (SDOH) Interventions    Readmission Risk Interventions No flowsheet data found.

## 2019-03-09 NOTE — Progress Notes (Signed)
[facility]                                                                              Patient Demographics  Noah Thomas, is a 58 y.o. male, DOB - July 12, 1961, WUJ:[id number]  Admit date - 02/28/2019   Admitting Physician Elease Etienne, MD  Outpatient Primary MD for the patient is Patient, No Pcp Per  Outpatient specialists:   LOS - 10  days   Medical records reviewed and are as summarized below:    Chief Complaint  Patient presents with   Altered Mental Status       Brief summary   :58 year old male, lives alone in a mobile home and independent, has not seen a physician in a long time, PMH of significant longstanding mental health issues, not on prescription medications, tobacco abuse, presented to [facility] ED on 02/10/2019 via EMS with altered mental status. History obtained from brother. Brother indicated that patient was normal until his teenage years when he started becoming paranoid and agitated while living with his parents, eventually had to be placed in [facility]. Over the years patient has calmed down, able to go to the grocery store for food, cigarettes but has no insight and unable to truly care for himself. He lives in [place] conditions to an extent that when EMS went to retrieve him today, authorities were planning to "condemn" his trailer home. Patient's brother has given him a debit card and provides with the finances. Patient last visited his brother on [birth year] his birthday, at which point he was in his usual state of health except he complained of some toe pain but was managing to ambulate. Since then the brother was trying to reach him by phone for several days and went over to his house a day before the admission but did not get an answer at the door. EMS found the patient on the ground in filthy condition, confused. Patient is unable to provide any history.   ED Course:Noted to be in filthy condition, tachycardic, not febrile, not  hypotensive, leukocytosis, elevated lactate, right toe exam suggestive of possible cellulitis, initiated sepsis protocol and received IV fluids and broad-spectrum IV antibiotics. Orthopedics consulted by EDP and will see him in the morning. WBC 14.6, hemoglobin 18.4, glucose 211, BUN 33, AST 136, ALT 69, total bilirubin 1.5, CK 3000 323, troponin 0 0.03, urine microscopy not suggestive of UTI, chest x-ray negative, CT head suggests subacute to chronic ischemia, right foot x-ray showed comminuted fracture of the first distal phalanx.  Assessment & Plan    Principal Problem:   Right great toe gangrene/ abscess:  - patient was found to have comminuted fracture of the right first distal phalanx extending to the articular surface -Patient was placed on IV vancomycin and cefepime, stopped 24 hours postop per Ortho recommendations. Per Dr. Lajoyce Corners,  wound [facility] for 1 week, NWB on R foot -Vascular surgery was consulted, patient underwent bilateral lower extremity arteriogram on 3/25, will eventually need right iliac thrombectomy, right iliac stent and right femoral AKA popliteal bypass after the carotid procedure next week.  Patient was started on heparin drip, now off. -Palliative  medicine was consulted for GOC.  Per patient and his brother's (HPOA) wishes, he was transitioned to comfort care on 3/26.  Patient did not want any further invasive interventions. -Comfort care, awaiting skilled nursing facility  Acute CVA/ bilat ICA stenosis: involving the left internal capsule and basal ganglia.  -CT head showed chronic atrophy, prior R MCA infarction with encephalomalacia, subacute to chronic left internal capsule/BG infarct -MRI of the brain showed acute/subacute infarct left internal capsule/BG.  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  Neurology recommended aspirin 81 mg and Plavix 75 mg daily for 3 weeks  and then aspirin alone.   -Comfort care awaiting skilled nursing facility  Acute metabolic encephalopathy -Secondary to CVA, acute infection, dehydration, may have underlying psych illness -Per patient's brother, reportedly he had a mental illness since a teenager, was in but not at one point, as an adult never worked more than a week and lives alone.  Patient has been dependent on his family all his adult life for food enlarging. -Per psychiatry recommendations, EKG obtained, QTC improved, recommended Risperdal.   -Too somnolent, difficult to arouse, will hold Risperdal.  Discontinued Robaxin, IV Dilaudid.  Did not receive narcotics overnight.  Diabetes mellitus type 2 -New diagnosis, hemoglobin A1c 7.7.  Patient had not been following with doctors. -Patient was seen by diabetic coordinator, recommended metformin at the time of discharge. -On sliding scale insulin, comfort care  Transaminitis -Improving, right upper quadrant ultrasound showed fatty liver  Generalized weakness, acute CVA Awaiting skilled nursing facility, comfort care  Rhabdomyolysis, acute, mild -Resolved with IV fluids  Goals of care Goals of care meeting on 3/26, management was addressed with patient and his brother by vascular surgery, myself and palliative medicine.  Patient has opted for no further invasive interventions and requested comfort care.  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 03/01/19 1706 Mar 13, 2019 0805   02/28/19 2100  vancomycin (VANCOCIN) 1,000 mg in sodium chloride 0.9 % 250 mL IVPB     1,000 mg 250 mL/hr over 60 Minutes Intravenous Every 12 hours 02/28/19 1707 03/04/2019 1159   02/28/19 0600  vancomycin (VANCOCIN) IVPB 1000 mg/200 mL premix  Status:  Discontinued     1,000 mg 200 mL/hr over 60 Minutes Intravenous Every 12 hours 02/21/2019 1708 02/28/19 1707   02/28/19 0600  ceFEPIme (MAXIPIME) 2 g in sodium chloride 0.9 % 100 mL IVPB     2 g 200 mL/hr over 30 Minutes Intravenous Every 12 hours 02/18/2019 1708 02/18/2019 0959   03/03/2019 1700  vancomycin (VANCOCIN)  1,500 mg in sodium chloride 0.9 % 500 mL IVPB     1,500 mg 250 mL/hr over 120 Minutes Intravenous  Once 02/16/2019 1613 02/12/2019 2250   03/04/2019 1615  ceFEPIme (MAXIPIME) 2 g in sodium chloride 0.9 % 100 mL IVPB     2 g 200 mL/hr over 30 Minutes Intravenous  Once 02/17/2019 1610 02/19/2019 1715   02/25/2019 1615  metroNIDAZOLE (FLAGYL) IVPB 500 mg     500 mg 100 mL/hr over 60 Minutes Intravenous  Once 02/11/2019 1610 03/04/2019 1845   02/19/2019 1615  vancomycin (VANCOCIN) IVPB 1000 mg/200 mL premix  Status:  Discontinued     1,000 mg 200 mL/hr over 60 Minutes Intravenous  Once 02/26/2019 1610 02/10/2019 1613         Medications  Scheduled Meds:  aspirin EC  81 mg Oral Daily   clopidogrel  75 mg Oral Daily   docusate sodium  100 mg Oral BID   Influenza vac split quadrivalent PF  0.5 mL Intramuscular Tomorrow-1000   insulin aspart  0-15 Units Subcutaneous TID WC   insulin aspart  0-5 Units Subcutaneous QHS   insulin glargine  7 Units Subcutaneous Daily    metoprolol tartrate  5 mg Intravenous Once   metoprolol tartrate  50 mg Oral BID   nicotine  21 mg Transdermal Daily   pentoxifylline  400 mg Oral TID WC   pneumococcal 23 valent vaccine  0.5 mL Intramuscular Tomorrow-1000   senna-docusate  2 tablet Oral QHS   Continuous Infusions:  PRN Meds:.acetaminophen **OR** acetaminophen, bisacodyl, hydrALAZINE, labetalol, magnesium citrate, ondansetron **OR** ondansetron (ZOFRAN) IV, oxyCODONE, polyethylene glycol      Subjective:   Normon Pettijohn was seen and examined today.  Snoring, difficult to arouse, appears comfortable, no fevers no acute issues overnight.  Comfort care  Objective:   Vitals:   03/20/2019 1145 03/20/2019 1450 03/09/19 0631 03/09/19 0759  BP: (!) 144/76 132/66 (!) 112/56 (!) 102/54  Pulse: 88 88 77 81  Resp: (!) 22 20  19   Temp: 98.6 F (37 C) 98.8 F (37.1 C) (!) 97.5 F (36.4 C) 97.6 F (36.4 C)  TempSrc: Axillary Axillary Oral Oral  SpO2: 98% 97% 97% 97%  Weight:      Height:        Intake/Output Summary (Last 24 hours) at 03/09/2019 1112 Last data filed at 03/09/2019 1610 Gross per 24 hour  Intake 360 ml  Output 300 ml  Net 60 ml     Wt Readings from Last 3 Encounters:  03/04/19 101.5 kg    Physical Exam  General: Somnolent snoring, difficult to arouse  Eyes:   HEENT:   Cardiovascular: S1 S2 clear, RRR  Respiratory: CTAB anteriorly, no wheezing  Gastrointestinal: Soft, nontender, nondistended, NBS  Ext: no pedal edema bilaterally  Neuro:  Musculoskeletal:   Skin:   Psych: Somnolent snoring     Data Reviewed:  I have personally reviewed following labs and imaging studies  Micro Results Recent Results (from the past 240 hour(s))  Urine culture     Status: None   Collection Time: 02/08/2019  4:04 PM  Result Value Ref Range Status   Specimen Description URINE, CATHETERIZED  Final   Special Requests Normal  Final   Culture   Final    NO GROWTH Performed at [facility], [address] [place], [place] [address]    Report Status 02/28/2019 FINAL  Final  Surgical pcr screen     Status: None  Collection Time: 03/01/19  5:15 PM  Result Value Ref Range Status   MRSA, PCR NEGATIVE NEGATIVE Final   Staphylococcus aureus NEGATIVE NEGATIVE Final    Comment: (NOTE) The Xpert SA Assay (FDA approved for NASAL specimens in patients 37 years of age and older), is one component of a comprehensive surveillance program. It is not intended to diagnose infection nor to guide or monitor treatment. Performed at [facility], [address] [place], [place] [address]     Radiology Reports Ct Angio Head W Or Wo Contrast  Result Date: 02/28/2019 CLINICAL DATA:  Stroke follow-up. 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 EXAM: CT ANGIOGRAPHY HEAD AND NECK TECHNIQUE: Multidetector CT imaging of the head and neck was performed using the standard protocol during bolus administration of intravenous contrast. Multiplanar CT image reconstructions and MIPs were obtained to evaluate the vascular anatomy. Carotid stenosis measurements (when applicable) are obtained utilizing NASCET criteria, using the distal internal carotid diameter as the denominator. CONTRAST:  59mL ISOVUE-370 IOPAMIDOL (ISOVUE-370) INJECTION 76% COMPARISON:  MRI of the brain 03-05-2019 FINDINGS: CT HEAD FINDINGS Brain: The left internal capsule infarct is again noted, now slightly lower density than on the previous CT. No new infarct is present. The remote right MCA territory encephalomalacia is stable. The ventricles are of proportionate to the degree of atrophy. No significant extraaxial fluid collection is present. The brainstem and cerebellum are within normal limits. Vascular: Atherosclerotic calcifications are present within the cavernous internal carotid arteries bilaterally. There is no hyperdense vessel. Skull: The craniocervical junction is normal.  Upper cervical spine is within normal limits. Marrow signal is unremarkable. Sinuses: Scattered opacification of left ethmoid air cells are present. There are no fluid levels. The paranasal sinuses and mastoid air cells are otherwise clear. Orbits: The globes and orbits are within normal limits. Review of the MIP images confirms the above findings CTA NECK FINDINGS Aortic arch: A 3 vessel arch configuration is present. Minimal atherosclerotic changes are present at the great vessel origins. There is no significant stenosis or aneurysm. Right carotid system: The right common carotid artery is within normal limits. Dense calcifications are present at the right carotid bifurcation. There is a high-grade stenosis at the carotid bifurcation. Lumen is narrowed to less than 1 mm. The more distal right common carotid artery is within normal limits to the skull base. Left carotid system: The left common carotid artery demonstrates some atherosclerotic irregularity. There is a high-grade, near occlusive stenosis of the left internal carotid artery at its bifurcation. The cervical left ICA is otherwise normal. Vertebral arteries: Extensive atherosclerotic calcifications are present along the vertebral arteries bilaterally. The right vertebral artery is dominant. There is a high-grade stenosis at the proximal left vertebral artery. No significant stenosis is present in the right vertebral artery. The left vertebral artery is reconstituted at the distal V1 segment. Extensive atherosclerotic changes are present throughout the V2 segment. These are high-grade stenosis at the level of C1. Skeleton: Vertebral body heights alignment are maintained. No focal lytic or blastic lesions are present. Other neck: The soft tissues the neck are otherwise unremarkable. No focal mucosal or submucosal lesions are present. Salivary glands are within normal limits. No significant adenopathy is present. Thyroid is normal. Upper chest: Next mild  dependent atelectasis is present. The lung apices are otherwise clear. Thoracic inlet is within normal limits. Review of the MIP images confirms the above findings CTA HEAD FINDINGS Anterior circulation: Atherosclerotic calcifications are present within the  cavernous internal carotid arteries bilaterally without a significant stenosis through the ICA termini. The left A1 is hypoplastic. The right A1 is normal. The anterior communicating artery is patent. ACA branch vessels are within normal limits bilaterally. There is a high-grade stenosis of the anterior right M2 segment with marked attenuation of distal branches. Diffuse irregularity present and more posterior left MCA branches. There is moderate irregularity in left MCA branches. Pial collaterals are evident. Posterior circulation: The right vertebral artery is the dominant vessel. Segmental irregularity is present in the left V4 segment without a significant stenosis. PICA origins are visualized and normal. The vertebrobasilar junction is normal. The basilar artery is normal. Both posterior cerebral arteries originate from the basilar tip. There is some irregularity of the proximal PCA vessels without significant proximal stenosis. Branch vessels are intact. Venous sinuses: The dural sinuses are patent. Anatomic variants: None Delayed phase: No pathologic enhancement is present. Infarcts are well-defined. Review of the MIP images confirms the above findings IMPRESSION: 1. High-grade bilateral proximal ICA stenoses at the carotid bifurcations. 2. 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 3. Hypoplastic left A1 segment. 4. High-grade stenosis of the anterior right M2 segment. 5. Moderate diffuse medium and distal small vessel disease in both the anterior and posterior circulations. 6. Expected evolution of left internal capsule nonhemorrhagic infarct. 7. Stable chronic encephalomalacia of the right MCA  territory. Electronically Signed   By: Marin Roberts M.D.   On: 02/28/2019 21:01   Dg Chest 2 View  Result Date: 02/09/2019 CLINICAL DATA:  Altered mental status today. EXAM: CHEST - 2 VIEW COMPARISON:  Single-view of the chest 07/16/2006. FINDINGS: Lungs clear. Heart size normal. No pneumothorax or pleural fluid. No acute or focal bony abnormality. IMPRESSION: Negative chest. Electronically Signed   By: Drusilla Kanner M.D.   On: 02/26/2019 16:00   Dg Knee 2 Views Right  Result Date: 02/23/2019 CLINICAL DATA:  Knee pain, initial encounter EXAM: RIGHT KNEE - 2 VIEW COMPARISON:  None. FINDINGS: Mild medial joint space narrowing is noted. No acute fracture or dislocation is seen. Mild patellofemoral spurring is noted as well. IMPRESSION: Mild degenerative change without acute abnormality. Electronically Signed   By: Alcide Clever M.D.   On: 02/20/2019 16:03   Ct Head Wo Contrast  Result Date: 02/07/2019 CLINICAL DATA:  Altered level of consciousness EXAM: CT HEAD WITHOUT CONTRAST TECHNIQUE: Contiguous axial images were obtained from the base of the skull through the vertex without intravenous contrast. COMPARISON:  None. FINDINGS: Brain: Mild atrophic changes are noted. Encephalomalacia changes are seen in the distribution of the right middle cerebral artery consistent with prior infarct. Rounded decreased area of attenuation is noted in the region of the internal capsule on the left suggestive of subacute to chronic ischemia. No focal area of acute infarct or acute hemorrhage is seen. No space-occupying mass lesion is noted. Vascular: No hyperdense vessel or unexpected calcification. Skull: Normal. Negative for fracture or focal lesion. Sinuses/Orbits: No acute finding. Other: None. IMPRESSION: Chronic atrophic changes. 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 Rounded somewhat elongated area of decreased attenuation on the left in the region of the internal capsule and basal  ganglia consistent with subacute to chronic ischemia. No acute infarct is noted. Electronically Signed   By: Alcide Clever M.D.   On: 02/15/2019 16:06   Ct Angio Neck W Or Wo Contrast  Result Date: 02/28/2019 CLINICAL DATA:  Stroke follow-up. Acute/subacute nonhemorrhagic infarct of  the left internal capsule and globus pallidus. EXAM: CT ANGIOGRAPHY HEAD AND NECK TECHNIQUE: Multidetector CT imaging of the head and neck was performed using the standard protocol during bolus administration of intravenous contrast. Multiplanar CT image reconstructions and MIPs were obtained to evaluate the vascular anatomy. Carotid stenosis measurements (when applicable) are obtained utilizing NASCET criteria, using the distal internal carotid diameter as the denominator. CONTRAST:  75mL ISOVUE-370 IOPAMIDOL (ISOVUE-370) INJECTION 76% COMPARISON:  MRI of the brain 03/08/2019 FINDINGS: CT HEAD FINDINGS Brain: The left internal capsule infarct is again noted, now slightly lower density than on the previous CT. No new infarct is present. The remote right MCA territory encephalomalacia is stable. The ventricles are of proportionate to the degree of atrophy. No significant extraaxial fluid collection is present. The brainstem and cerebellum are within normal limits. Vascular: Atherosclerotic calcifications are present within the cavernous internal carotid arteries bilaterally. There is no hyperdense vessel. Skull: The craniocervical junction is normal. Upper cervical spine is within normal limits. Marrow signal is unremarkable. Sinuses: Scattered opacification of left ethmoid air cells are present. There are no fluid levels. The paranasal sinuses and mastoid air cells are otherwise clear. Orbits: The globes and orbits are within normal limits. Review of the MIP images confirms the above findings CTA NECK FINDINGS Aortic arch: A 3 vessel arch configuration is present. Minimal atherosclerotic changes are present at the great vessel  origins. There is no significant stenosis or aneurysm. Right carotid system: The right common carotid artery is within normal limits. Dense calcifications are present at the right carotid bifurcation. There is a high-grade stenosis at the carotid bifurcation. Lumen is narrowed to less than 1 mm. The more distal right common carotid artery is within normal limits to the skull base. Left carotid system: The left common carotid artery demonstrates some atherosclerotic irregularity. There is a high-grade, near occlusive stenosis of the left internal carotid artery at its bifurcation. The cervical left ICA is otherwise normal. Vertebral arteries: Extensive atherosclerotic calcifications are present along the vertebral arteries bilaterally. The right vertebral artery is dominant. There is a high-grade stenosis at the proximal left vertebral artery. No significant stenosis is present in the right vertebral artery. The left vertebral artery is reconstituted at the distal V1 segment. Extensive atherosclerotic changes are present throughout the V2 segment. These are high-grade stenosis at the level of C1. Skeleton: Vertebral body heights alignment are maintained. No focal lytic or blastic lesions are present. Other neck: The soft tissues the neck are otherwise unremarkable. No focal mucosal or submucosal lesions are present. Salivary glands are within normal limits. No significant adenopathy is present. Thyroid is normal. Upper chest: Next mild dependent atelectasis is present. The lung apices are otherwise clear. Thoracic inlet is within normal limits. Review of the MIP images confirms the above findings CTA HEAD FINDINGS Anterior circulation: Atherosclerotic calcifications are present within the cavernous internal carotid arteries bilaterally without a significant stenosis through the ICA termini. The left A1 is hypoplastic. The right A1 is normal. The anterior communicating artery is patent. ACA branch vessels are within  normal limits bilaterally. There is a high-grade stenosis of the anterior right M2 segment with marked attenuation of distal branches. Diffuse irregularity present and more posterior left MCA branches. There is moderate irregularity in left MCA branches. Pial collaterals are evident. Posterior circulation: The right vertebral artery is the dominant vessel. Segmental irregularity is present in the left V4 segment without a significant stenosis. PICA origins are visualized and normal. The vertebrobasilar junction is  normal. The basilar artery is normal. Both posterior cerebral arteries originate from the basilar tip. There is some irregularity of the proximal PCA vessels without significant proximal stenosis. Branch vessels are intact. Venous sinuses: The dural sinuses are patent. Anatomic variants: None Delayed phase: No pathologic enhancement is present. Infarcts are well-defined. Review of the MIP images confirms the above findings IMPRESSION: 1. High-grade bilateral proximal ICA stenoses at the carotid bifurcations. 2. 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 3. Hypoplastic left A1 segment. 4. High-grade stenosis of the anterior right M2 segment. 5. Moderate diffuse medium and distal small vessel disease in both the anterior and posterior circulations. 6. Expected evolution of left internal capsule nonhemorrhagic infarct. 7. 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 Electronically Signed   By: Marin Roberts M.D.   On: 02/28/2019 21:01   [name]  Result Date: 02/13/2019 CLINICAL DATA:  Focal neuro deficit for greater than 6 hours. Altered level of consciousness. EXAM: MRI HEAD WITHOUT CONTRAST TECHNIQUE: Multiplanar, multiecho pulse sequences of the brain and surrounding structures were obtained without intravenous contrast. COMPARISON:  CT head without contrast 01/29/2019 FINDINGS: Brain: The diffusion-weighted  images confirm an acute nonhemorrhagic infarct involving the genu of the left internal capsule and globus pallidus. T2 signal changes are associated with the acute infarct, consistent with the subacute time frame. Remote encephalomalacia is again noted right MCA territory. The ventricles are of proportionate to the degree of atrophy. Wallerian degeneration is present in the right cerebral peduncle extending into the pons. Cerebellum is normal. No significant extraaxial fluid collection is present. Vascular: Flow is present in the major intracranial arteries. Skull and upper cervical spine: The craniocervical junction is normal. Upper cervical spine is within normal limits. Marrow signal is unremarkable. Sinuses/Orbits: The paranasal sinuses and mastoid air cells are clear. Bilateral lens replacements are noted. The globes and orbits are within normal limits. IMPRESSION: 1. 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 This is consistent with a time frame of greater than 6 hours. 2. 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 Electronically Signed   By: Marin Roberts M.D.   On: 03/07/2019 19:18   Dg Foot Complete Right  Result Date: 02/17/2019 CLINICAL DATA:  Right foot pain EXAM: RIGHT FOOT COMPLETE - 3+ VIEW COMPARISON:  None. FINDINGS: Comminuted fracture is noted at the base of the first distal phalanx. This extends into the articular surface in multiple locations. No other fracture is seen. Soft tissue swelling is noted. IMPRESSION: Comminuted fracture of the first distal phalanx which extends to the articular surface. Electronically Signed   By: Alcide Clever M.D.   On: 02/16/2019 16:01   Vas [place] Vanice Sarah With/wo Tbi  Result Date: 03/01/2019 LOWER EXTREMITY DOPPLER STUDY Indications: Gangrene. High Risk Factors: Current smoker.  Comparison Study: No prior study on file Performing Technologist: Sherren Kerns  RVS  Examination Guidelines: A complete evaluation includes at minimum, Doppler waveform signals and systolic blood pressure reading at the level of bilateral brachial, anterior tibial, and posterior tibial arteries, when vessel segments are accessible. Bilateral testing is considered an integral part of a complete examination. Photoelectric Plethysmograph (PPG) waveforms and toe systolic pressure readings are included as required and additional duplex testing as needed. Limited examinations for reoccurring indications may be performed as noted.  ABI Findings: +---------+------------------+-----+-------------------+-----------------------+  Right     Rt Pressure (mmHg) Index Waveform  Comment                  +---------+------------------+-----+-------------------+-----------------------+  Brachial  183                      triphasic                                    +---------+------------------+-----+-------------------+-----------------------+  PTA       67                 0.37  dampened monophasic                          +---------+------------------+-----+-------------------+-----------------------+  DP        74                 0.40  dampened monophasic                          +---------+------------------+-----+-------------------+-----------------------+  Great Toe                                              Not done secondary to                                                            gangrene                 +---------+------------------+-----+-------------------+-----------------------+ +--------+------------------+-----+---------+-------+  Left     Lt Pressure (mmHg) Index Waveform  Comment  +--------+------------------+-----+---------+-------+  Brachial 171                      triphasic          +--------+------------------+-----+---------+-------+  PTA      185                1.01  biphasic           +--------+------------------+-----+---------+-------+  DP       176                 0.96  biphasic           +--------+------------------+-----+---------+-------+ +-------+-----------+-----------+------------+------------+  ABI/TBI Today's ABI Today's TBI Previous ABI Previous TBI  +-------+-----------+-----------+------------+------------+  Right   0.40                                               +-------+-----------+-----------+------------+------------+  Left    1.01                                               +-------+-----------+-----------+------------+------------+  Summary: Right: 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 Left: Resting left ankle-brachial index is within normal range. No evidence of significant left lower extremity arterial disease.  *See  table(s) above for measurements and observations.  Electronically signed by Coral ElseVance Brabham MD on 03/01/2019 at 10:49:49 AM.    Final    Vas [place] Carotid  Result Date: 03/06/2019 Carotid Arterial Duplex Study Indications:                           ICA stenosis. Limitations:                           Technically difficult patient due                                        movement, cooperation, and positioning. Pre-Surgical Evaluation & Surgical     Stenosis at bifurcation only. ICA is not Correlation:                           normal past stenosis. Bifurcation is                                        located near the Hyoid Notch. Right sided                                        80 to 99% ICA stenosis originates within                                        the bifurcation and extends approximately                                        1.1 cm into the proximal ICA. There is no                                        apparent hemodynamically significant                                        stenosis involving the mid or distal ICA.                                        Left sided 80 to 99% ICA stenosis                                        originates within the bifurcation and                                         extends approximately 1.1 cm into the  proximal ICA. There is no apparent                                        hemodynamically significant stenosis                                        involving the mid or distal ICA. Performing Technologist: Chanda Busing RVT  Examination Guidelines: A complete evaluation includes B-mode imaging, spectral Doppler, color Doppler, and power Doppler as needed of all accessible portions of each vessel. Bilateral testing is considered an integral part of a complete examination. Limited examinations for reoccurring indications may be performed as noted.  Right Carotid Findings: +----------+--------+--------+--------+--------+--------+             PSV cm/s EDV cm/s Stenosis Describe Comments  +----------+--------+--------+--------+--------+--------+  CCA Distal 63       16                                   +----------+--------+--------+--------+--------+--------+  ICA Prox   397      115      80-99%                      +----------+--------+--------+--------+--------+--------+  ICA Distal 85       31                                   +----------+--------+--------+--------+--------+--------+  Left Carotid Findings: +----------+--------+--------+--------+--------+--------+             PSV cm/s EDV cm/s Stenosis Describe Comments  +----------+--------+--------+--------+--------+--------+  CCA Distal 36       9                                    +----------+--------+--------+--------+--------+--------+  ICA Prox   358      123      80-99%                      +----------+--------+--------+--------+--------+--------+  ICA Distal 106      29                                   +----------+--------+--------+--------+--------+--------+  Summary: Right Carotid: Right sided 80 to 99% ICA stenosis originates within the                bifurcation and extends approximately 1.1 cm into the proximal                ICA. There is  no apparent hemodynamically significant stenosis                involving the mid or distal ICA. Left Carotid: Left sided 80 to 99% ICA stenosis originates within the               bifurcation and extends approximately 1.1 cm into the proximal               ICA. There is no apparent hemodynamically significant stenosis  involving the mid or distal ICA.  *See table(s) above for measurements and observations.  Electronically signed by Coral Else MD on 03/05/2019 at 1:35:19 PM.    Final    US Abdomen Limited Ruq  Result Date: 03/01/2019 CLINICAL DATA:  Elevated LFTs. EXAM: ULTRASOUND ABDOMEN LIMITED RIGHT UPPER QUADRANT COMPARISON:  None. FINDINGS: Gallbladder: Physiologically distended. No gallstones or wall thickening visualized. No sonographic Murphy sign noted by sonographer. Common bile duct: Diameter: 6 mm, normal. Liver: No focal lesion identified. Diffusely increased and heterogeneous in parenchymal echogenicity. Portal vein is patent on color Doppler imaging with normal direction of blood flow towards the liver. IMPRESSION: 1. Hepatic steatosis. 2. Normal sonographic appearance of gallbladder and biliary tree. Electronically Signed   By: Narda Rutherford M.D.   On: 03/01/2019 23:36    Lab Data:  CBC: Recent Labs  Lab 02/09/2019 0316 03/04/19 0233  WBC 12.6* 15.2*  HGB 13.8 13.3  HCT 41.4 38.1*  MCV 87.7 87.6  PLT 253 234   Basic Metabolic Panel: Recent Labs  Lab 02/13/2019 0316 03/04/19 0233  NA 134* 133*  K 3.2* 3.5  CL 103 104  CO2 23 22  GLUCOSE 156* 141*  BUN 14 11  CREATININE 1.05 0.92  CALCIUM 8.1* 8.2*   GFR: Estimated Creatinine Clearance: 107.9 mL/min (by C-G formula based on SCr of 0.92 mg/dL). Liver Function Tests: Recent Labs  Lab 02/25/2019 0316 03/04/19 0233  AST 59* 42*  ALT 62* 58*  ALKPHOS 75 62  BILITOT 0.5 0.6  PROT 5.3* 5.3*  ALBUMIN 2.4* 2.2*   No results for input(s): LIPASE, AMYLASE in the last 168 hours. No results for input(s):  AMMONIA in the last 168 hours. Coagulation Profile: No results for input(s): INR, PROTIME in the last 168 hours. Cardiac Enzymes: No results for input(s): CKTOTAL, CKMB, CKMBINDEX, TROPONINI in the last 168 hours. BNP (last 3 results) No results for input(s): PROBNP in the last 8760 hours. HbA1C: No results for input(s): HGBA1C in the last 72 hours. CBG: Recent Labs  Lab 03/04/19 1218 03-20-19 1144 03-20-19 1615 March 20, 2019 2051 03/09/19 0756  GLUCAP 223* 316* 317* 293* 307*   Lipid Profile: No results for input(s): CHOL, HDL, LDLCALC, TRIG, CHOLHDL, LDLDIRECT in the last 72 hours. Thyroid Function Tests: No results for input(s): TSH, T4TOTAL, FREET4, T3FREE, THYROIDAB in the last 72 hours. Anemia Panel: No results for input(s): VITAMINB12, FOLATE, FERRITIN, TIBC, IRON, RETICCTPCT in the last 72 hours. Urine analysis:    Component Value Date/Time   COLORURINE YELLOW 02/08/2019 1604   APPEARANCEUR CLEAR 02/16/2019 1604   LABSPEC 1.026 02/13/2019 1604   PHURINE 5.0 02/26/2019 1604   GLUCOSEU 150 (A) 02/07/2019 1604   HGBUR SMALL (A) 03/05/2019 1604   BILIRUBINUR NEGATIVE 02/10/2019 1604   KETONESUR 20 (A) 02/07/2019 1604   PROTEINUR NEGATIVE 02/08/2019 1604   NITRITE NEGATIVE 02/10/2019 1604   LEUKOCYTESUR NEGATIVE 02/26/2019 1604     Zen Cedillos M.D. [facility] 03/09/2019, 11:12 AM  Pager: [phone] Between 7am to 7pm - call Pager - [phone]  After 7pm go to [email or web address] - password [name]  Call night coverage person covering after 7pm

## 2019-03-09 NOTE — Progress Notes (Signed)
Patient remains unresponsive and noted a change in patient breathing and noted increased secretion, suction patient and MD made aware, new orders were noted.

## 2019-03-09 NOTE — Progress Notes (Signed)
Patient unable to be arose, respond to sternal rub very minimal, moans sternal rub done. MD aware. Will  continue to monitor. 1016AM patient continue to be sedated, not waking up for meds, will monitor.

## 2019-03-10 DEATH — deceased

## 2019-04-09 NOTE — Death Summary Note (Signed)
DEATH SUMMARY   Patient Details  Name: Noah Thomas MRN: [id number] DOB: November 07, 1961  Admission/Discharge Information   Admit Date:  2019/03/12  Date of Death: Date of Death: 03/23/2019  Time of Death: Time of Death: 0340  Length of Stay: 05/02/23  Referring Physician: Patient, No Pcp Per   Reason(s) for Hospitalization  58 year old male who had not not seen a physician in a long time with past medical history significant for longstanding mental health issues, not on prescription medications, tobacco abuse, presented to [facility] ED on 03/12/2019 via EMS with altered mental status. Brother indicated that patient was normal until his teenage years when he started becoming paranoid and agitated while living with his parents, eventually had to be placed in [facility]. Over the years patient had calmed down, able to go to the grocery store for food, cigarettes but has no insight and unable to truly care for himself. He lived in [place] conditions to an extent that when EMS went to retrieve him today, authorities were planning to "condemn" his trailer home. Patient's brother has given him a debit card and provides with the finances. Patient last visited his brother on [birth year] his birthday, at which point he was in his usual state of health except he complained of some toe pain but was managing to ambulate. Since then the brother was trying to reach him by phone for several days and went over to his house a day before the admission but did not get an answer at the door. EMS found the patient on the ground in filthy condition, confused. Patient is unable to provide any history.   ED Course:Noted to be in filthy condition, tachycardic, not febrile, not hypotensive, leukocytosis, elevated lactate, right toe exam suggestive of possible cellulitis, initiated sepsis protocol and received IV fluids and broad-spectrum IV antibiotics. Orthopedics consulted by EDP and will see him in the morning. WBC 14.6,  hemoglobin 18.4, glucose 211, BUN 33, AST 136, ALT 69, total bilirubin 1.5, CK 3000 323, troponin 0 0.03, urine microscopy not suggestive of UTI, chest x-ray negative, CT head suggests subacute to chronic ischemia, right foot x-ray showed comminuted fracture of the first distal phalanx.  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 Per Dr. Lajoyce Corners,  wound [facility] for 1 week, NWB on R foot -Vascular surgery was consulted, patient underwent bilateral lower extremity arteriogram on 3/25, will eventually need right iliac thrombectomy, right iliac stent and right femoral AKA popliteal bypass after the carotid procedure next week.  Patient was started on heparin drip which was transitioned to off after the goals of care meeting -Palliative medicine was consulted for GOC.  Per patient and his brother's (HPOA) wishes, he was transitioned to comfort care on 3/26.  Patient did not want any further invasive interventions.   Acute CVA/ bilat ICA stenosis: involving the left internal capsule and basal ganglia.  -CT head showed chronic atrophy, prior R MCA infarction  with encephalomalacia, subacute to chronic left internal capsule/BG infarct -MRI of the brain showed acute/subacute infarct left internal capsule/BG.  Old  right MCA encephalomalacia -CT angiogram head and neck showed high-grade stenosis bilateral ICA bifurcations -LDL 81, hemoglobin A1c 7.7.  Neurology recommended aspirin 81 mg and Plavix 75 mg daily for 3 weeks and then aspirin alone.   -Patient was placed on comfort care status per his wishes  Acute metabolic encephalopathy -Secondary to CVA, acute infection, dehydration, underlying psych illness -Per patient's brother, reportedly he had a mental illness since a teenager, was in but not at one point, as an adult never worked more than a week and lives alone.  Patient has been dependent on his family all his adult life for food enlarging. -Per psychiatry recommendations, EKG obtained, QTC improved, recommended Risperdal.    Diabetes mellitus type 2 -New diagnosis, hemoglobin A1c 7.7.  Patient had not been following with doctors. -Patient was placed on sliding scale insulin, subsequently comfort care  Transaminitis -right upper quadrant ultrasound showed fatty liver  Generalized weakness, acute CVA Comfort care  Rhabdomyolysis, acute, mild -Resolved with IV fluids  Patient was still awaiting disposition to SNF with hospice, when he passed on 03/21/2019 at 3:40 AM   Pertinent Labs and Studies  Significant Diagnostic Studies Ct Angio Head W Or Wo Contrast  Result Date: 02/28/2019 CLINICAL DATA:  Stroke follow-up. 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 EXAM: CT ANGIOGRAPHY HEAD AND NECK TECHNIQUE: Multidetector CT imaging of the head and neck was performed using the standard protocol during bolus administration of intravenous contrast. Multiplanar CT image reconstructions and MIPs were obtained to evaluate the vascular anatomy. Carotid stenosis measurements (when applicable) are obtained utilizing NASCET criteria, using the distal internal carotid diameter as the denominator. CONTRAST:  75mL ISOVUE-370 IOPAMIDOL (ISOVUE-370) INJECTION 76%  COMPARISON:  MRI of the brain 02/28/2019 FINDINGS: CT HEAD FINDINGS Brain: The left internal capsule infarct is again noted, now slightly lower density than on the previous CT. No new infarct is present. The remote right MCA territory encephalomalacia is stable. The ventricles are of proportionate to the degree of atrophy. No significant extraaxial fluid collection is present. The brainstem and cerebellum are within normal limits. Vascular: Atherosclerotic calcifications are present within the cavernous internal carotid arteries bilaterally. There is no hyperdense vessel. Skull: The craniocervical junction is normal. Upper cervical spine is within normal limits. Marrow signal is unremarkable. Sinuses: Scattered opacification of left ethmoid air cells are present. There are no fluid levels. The paranasal sinuses and mastoid air cells are otherwise clear. Orbits: The globes and orbits are within normal limits. Review of the MIP images confirms the above findings CTA NECK FINDINGS Aortic arch: A 3 vessel arch configuration is present. Minimal atherosclerotic changes are present at the great vessel origins. There is no significant stenosis or aneurysm. Right carotid system: The right common carotid artery is within normal limits. Dense calcifications are present at the right carotid bifurcation. There is a high-grade stenosis at the carotid bifurcation. Lumen is narrowed to less than 1 mm. The more distal right common carotid artery is within normal limits to the skull base. Left carotid system: The left common carotid artery demonstrates some atherosclerotic irregularity. There is a high-grade, near occlusive stenosis of the left internal carotid artery at its bifurcation. The cervical left ICA is otherwise normal. Vertebral arteries: Extensive atherosclerotic calcifications are present along the vertebral arteries bilaterally. The right vertebral artery is dominant. There is a high-grade stenosis  at the proximal left  vertebral artery. No significant stenosis is present in the right vertebral artery. The left vertebral artery is reconstituted at the distal V1 segment. Extensive atherosclerotic changes are present throughout the V2 segment. These are high-grade stenosis at the level of C1. Skeleton: Vertebral body heights alignment are maintained. No focal lytic or blastic lesions are present. Other neck: The soft tissues the neck are otherwise unremarkable. No focal mucosal or submucosal lesions are present. Salivary glands are within normal limits. No significant adenopathy is present. Thyroid is normal. Upper chest: Next mild dependent atelectasis is present. The lung apices are otherwise clear. Thoracic inlet is within normal limits. Review of the MIP images confirms the above findings CTA HEAD FINDINGS Anterior circulation: Atherosclerotic calcifications are present within the cavernous internal carotid arteries bilaterally without a significant stenosis through the ICA termini. The left A1 is hypoplastic. The right A1 is normal. The anterior communicating artery is patent. ACA branch vessels are within normal limits bilaterally. There is a high-grade stenosis of the anterior right M2 segment with marked attenuation of distal branches. Diffuse irregularity present and more posterior left MCA branches. There is moderate irregularity in left MCA branches. Pial collaterals are evident. Posterior circulation: The right vertebral artery is the dominant vessel. Segmental irregularity is present in the left V4 segment without a significant stenosis. PICA origins are visualized and normal. The vertebrobasilar junction is normal. The basilar artery is normal. Both posterior cerebral arteries originate from the basilar tip. There is some irregularity of the proximal PCA vessels without significant proximal stenosis. Branch vessels are intact. Venous sinuses: The dural sinuses are patent. Anatomic variants: None Delayed phase: No  pathologic enhancement is present. Infarcts are well-defined. Review of the MIP images confirms the above findings IMPRESSION: 1. High-grade bilateral proximal ICA stenoses at the carotid bifurcations. 2. 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 3. Hypoplastic left A1 segment. 4. High-grade stenosis of the anterior right M2 segment. 5. Moderate diffuse medium and distal small vessel disease in both the anterior and posterior circulations. 6. Expected evolution of left internal capsule nonhemorrhagic infarct. 7. 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 Electronically Signed   By: Marin Roberts M.D.   On: 02/28/2019 21:01   Dg Chest 2 View  Result Date: 02/15/2019 CLINICAL DATA:  Altered mental status today. EXAM: CHEST - 2 VIEW COMPARISON:  Single-view of the chest 07/16/2006. FINDINGS: Lungs clear. Heart size normal. No pneumothorax or pleural fluid. No acute or focal bony abnormality. IMPRESSION: Negative chest. Electronically Signed   By: Drusilla Kanner M.D.   On: 02/11/2019 16:00   Dg Knee 2 Views Right  Result Date: 02/23/2019 CLINICAL DATA:  Knee pain, initial encounter EXAM: RIGHT KNEE - 2 VIEW COMPARISON:  None. FINDINGS: Mild medial joint space narrowing is noted. No acute fracture or dislocation is seen. Mild patellofemoral spurring is noted as well. IMPRESSION: Mild degenerative change without acute abnormality. Electronically Signed   By: Alcide Clever M.D.   On: 03/04/2019 16:03   Ct Head Wo Contrast  Result Date: 02/12/2019 CLINICAL DATA:  Altered level of consciousness EXAM: CT HEAD WITHOUT CONTRAST TECHNIQUE: Contiguous axial images were obtained from the base of the skull through the vertex without intravenous contrast. COMPARISON:  None. FINDINGS: Brain: Mild atrophic changes are noted. Encephalomalacia changes are seen in the distribution of the right middle cerebral artery consistent with prior  infarct. Rounded decreased area of attenuation is noted  in the region of the internal capsule on the left suggestive of subacute to chronic ischemia. No focal area of acute infarct or acute hemorrhage is seen. No space-occupying mass lesion is noted. Vascular: No hyperdense vessel or unexpected calcification. Skull: Normal. Negative for fracture or focal lesion. Sinuses/Orbits: No acute finding. Other: None. IMPRESSION: Chronic atrophic changes. 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 Rounded somewhat elongated area of decreased attenuation on the left in the region of the internal capsule and basal ganglia consistent with subacute to chronic ischemia. No acute infarct is noted. Electronically Signed   By: Alcide Clever M.D.   On: 03/07/2019 16:06   Ct Angio Neck W Or Wo Contrast  Result Date: 02/28/2019 CLINICAL DATA:  Stroke follow-up. 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 EXAM: CT ANGIOGRAPHY HEAD AND NECK TECHNIQUE: Multidetector CT imaging of the head and neck was performed using the standard protocol during bolus administration of intravenous contrast. Multiplanar CT image reconstructions and MIPs were obtained to evaluate the vascular anatomy. Carotid stenosis measurements (when applicable) are obtained utilizing NASCET criteria, using the distal internal carotid diameter as the denominator. CONTRAST:  75mL ISOVUE-370 IOPAMIDOL (ISOVUE-370) INJECTION 76% COMPARISON:  MRI of the brain 02/16/2019 FINDINGS: CT HEAD FINDINGS Brain: The left internal capsule infarct is again noted, now slightly lower density than on the previous CT. No new infarct is present. The remote right MCA territory encephalomalacia is stable. The ventricles are of proportionate to the degree of atrophy. No significant extraaxial fluid collection is present. The brainstem and cerebellum are within normal limits. Vascular: Atherosclerotic calcifications are present  within the cavernous internal carotid arteries bilaterally. There is no hyperdense vessel. Skull: The craniocervical junction is normal. Upper cervical spine is within normal limits. Marrow signal is unremarkable. Sinuses: Scattered opacification of left ethmoid air cells are present. There are no fluid levels. The paranasal sinuses and mastoid air cells are otherwise clear. Orbits: The globes and orbits are within normal limits. Review of the MIP images confirms the above findings CTA NECK FINDINGS Aortic arch: A 3 vessel arch configuration is present. Minimal atherosclerotic changes are present at the great vessel origins. There is no significant stenosis or aneurysm. Right carotid system: The right common carotid artery is within normal limits. Dense calcifications are present at the right carotid bifurcation. There is a high-grade stenosis at the carotid bifurcation. Lumen is narrowed to less than 1 mm. The more distal right common carotid artery is within normal limits to the skull base. Left carotid system: The left common carotid artery demonstrates some atherosclerotic irregularity. There is a high-grade, near occlusive stenosis of the left internal carotid artery at its bifurcation. The cervical left ICA is otherwise normal. Vertebral arteries: Extensive atherosclerotic calcifications are present along the vertebral arteries bilaterally. The right vertebral artery is dominant. There is a high-grade stenosis at the proximal left vertebral artery. No significant stenosis is present in the right vertebral artery. The left vertebral artery is reconstituted at the distal V1 segment. Extensive atherosclerotic changes are present throughout the V2 segment. These are high-grade stenosis at the level of C1. Skeleton: Vertebral body heights alignment are maintained. No focal lytic or blastic lesions are present. Other neck: The soft tissues the neck are otherwise unremarkable. No focal mucosal or submucosal lesions  are present. Salivary glands are within normal limits. No significant adenopathy is present. Thyroid is normal. Upper chest: Next mild dependent atelectasis is present. The lung apices are otherwise clear. Thoracic  inlet is within normal limits. Review of the MIP images confirms the above findings CTA HEAD FINDINGS Anterior circulation: Atherosclerotic calcifications are present within the cavernous internal carotid arteries bilaterally without a significant stenosis through the ICA termini. The left A1 is hypoplastic. The right A1 is normal. The anterior communicating artery is patent. ACA branch vessels are within normal limits bilaterally. There is a high-grade stenosis of the anterior right M2 segment with marked attenuation of distal branches. Diffuse irregularity present and more posterior left MCA branches. There is moderate irregularity in left MCA branches. Pial collaterals are evident. Posterior circulation: The right vertebral artery is the dominant vessel. Segmental irregularity is present in the left V4 segment without a significant stenosis. PICA origins are visualized and normal. The vertebrobasilar junction is normal. The basilar artery is normal. Both posterior cerebral arteries originate from the basilar tip. There is some irregularity of the proximal PCA vessels without significant proximal stenosis. Branch vessels are intact. Venous sinuses: The dural sinuses are patent. Anatomic variants: None Delayed phase: No pathologic enhancement is present. Infarcts are well-defined. Review of the MIP images confirms the above findings IMPRESSION: 1. High-grade bilateral proximal ICA stenoses at the carotid bifurcations. 2. 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 3. Hypoplastic left A1 segment. 4. High-grade stenosis of the anterior right M2 segment. 5. Moderate diffuse medium and distal small vessel disease in both the anterior and posterior  circulations. 6. Expected evolution of left internal capsule nonhemorrhagic infarct. 7. 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 Electronically Signed   By: Marin Robertshristopher  Mattern M.D.   On: 02/28/2019 21:01   [name]  Result Date: 02/26/2019 CLINICAL DATA:  Focal neuro deficit for greater than 6 hours. Altered level of consciousness. EXAM: MRI HEAD WITHOUT CONTRAST TECHNIQUE: Multiplanar, multiecho pulse sequences of the brain and surrounding structures were obtained without intravenous contrast. COMPARISON:  CT head without contrast 01/29/2019 FINDINGS: Brain: The diffusion-weighted images confirm an acute nonhemorrhagic infarct involving the genu of the left internal capsule and globus pallidus. T2 signal changes are associated with the acute infarct, consistent with the subacute time frame. Remote encephalomalacia is again noted right MCA territory. The ventricles are of proportionate to the degree of atrophy. Wallerian degeneration is present in the right cerebral peduncle extending into the pons. Cerebellum is normal. No significant extraaxial fluid collection is present. Vascular: Flow is present in the major intracranial arteries. Skull and upper cervical spine: The craniocervical junction is normal. Upper cervical spine is within normal limits. Marrow signal is unremarkable. Sinuses/Orbits: The paranasal sinuses and mastoid air cells are clear. Bilateral lens replacements are noted. The globes and orbits are within normal limits. IMPRESSION: 1. 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 This is consistent with a time frame of greater than 6 hours. 2. 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 Electronically Signed   By: Marin Robertshristopher  Mattern M.D.   On: 03/06/2019 19:18   Dg Foot Complete Right  Result Date: 02/11/2019 CLINICAL DATA:  Right foot pain EXAM: RIGHT FOOT COMPLETE  - 3+ VIEW COMPARISON:  None. FINDINGS: Comminuted fracture is noted at the base of the first distal phalanx. This extends into the articular surface in multiple locations. No other fracture is seen. Soft tissue swelling is noted. IMPRESSION: Comminuted fracture of the first distal phalanx which extends to the articular surface. Electronically Signed   By: Eulah PontMark  Lukens M.D.  On: 03/05/2019 16:01   Vas [place] Vanice Sarah With/wo Tbi  Result Date: 03/01/2019 LOWER EXTREMITY DOPPLER STUDY Indications: Gangrene. High Risk Factors: Current smoker.  Comparison Study: No prior study on file Performing Technologist: Sherren Kerns RVS  Examination Guidelines: A complete evaluation includes at minimum, Doppler waveform signals and systolic blood pressure reading at the level of bilateral brachial, anterior tibial, and posterior tibial arteries, when vessel segments are accessible. Bilateral testing is considered an integral part of a complete examination. Photoelectric Plethysmograph (PPG) waveforms and toe systolic pressure readings are included as required and additional duplex testing as needed. Limited examinations for reoccurring indications may be performed as noted.  ABI Findings: +---------+------------------+-----+-------------------+-----------------------+ Right    Rt Pressure (mmHg)IndexWaveform           Comment                 +---------+------------------+-----+-------------------+-----------------------+ Brachial 183                    triphasic                                  +---------+------------------+-----+-------------------+-----------------------+ PTA      67                0.37 dampened monophasic                        +---------+------------------+-----+-------------------+-----------------------+ DP       74                0.40 dampened monophasic                        +---------+------------------+-----+-------------------+-----------------------+ Great Toe                                           Not done secondary to                                                      gangrene                +---------+------------------+-----+-------------------+-----------------------+ +--------+------------------+-----+---------+-------+ Left    Lt Pressure (mmHg)IndexWaveform Comment +--------+------------------+-----+---------+-------+ [id number]                    triphasic        +--------+------------------+-----+---------+-------+ PTA     185               1.01 biphasic         +--------+------------------+-----+---------+-------+ DP      176               0.96 biphasic         +--------+------------------+-----+---------+-------+ +-------+-----------+-----------+------------+------------+ ABI/TBIToday's ABIToday's TBIPrevious ABIPrevious TBI +-------+-----------+-----------+------------+------------+ Right  0.40                                           +-------+-----------+-----------+------------+------------+ Left   1.01                                           +-------+-----------+-----------+------------+------------+  Summary: Right: 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 Left: Resting left ankle-brachial index is within normal range. No evidence of significant left lower extremity arterial disease.  *See table(s) above for measurements and observations.  Electronically signed by Coral Else MD on 03/01/2019 at 10:49:49 AM.    Final    Vas US Carotid  Result Date: 02/07/2019 Carotid Arterial Duplex Study Indications:                           ICA stenosis. Limitations:                           Technically difficult patient due                                        movement, cooperation, and positioning. Pre-Surgical Evaluation & Surgical     Stenosis at bifurcation only. ICA is not Correlation:                           normal past stenosis. Bifurcation is                                         located near the [place]oid [place]. Right sided                                        80 to 99% ICA stenosis originates within                                        the bifurcation and extends approximately                                        1.1 cm into the proximal ICA. There is no                                        apparent hemodynamically significant                                        stenosis involving the mid or distal ICA.                                        Left sided 80 to 99% ICA stenosis                                        originates within the bifurcation and  extends approximately 1.1 cm into the                                        proximal ICA. There is no apparent                                        hemodynamically significant stenosis                                        involving the mid or distal ICA. Performing Technologist: Chanda Busing RVT  Examination Guidelines: A complete evaluation includes B-mode imaging, spectral Doppler, color Doppler, and power Doppler as needed of all accessible portions of each vessel. Bilateral testing is considered an integral part of a complete examination. Limited examinations for reoccurring indications may be performed as noted.  Right Carotid Findings: +----------+--------+--------+--------+--------+--------+           PSV cm/sEDV cm/sStenosisDescribeComments +----------+--------+--------+--------+--------+--------+ CCA Distal63      16                               +----------+--------+--------+--------+--------+--------+ ICA Prox  397     115     80-99%                   +----------+--------+--------+--------+--------+--------+ ICA Distal85      31                               +----------+--------+--------+--------+--------+--------+  Left Carotid Findings: +----------+--------+--------+--------+--------+--------+           PSV cm/sEDV  cm/sStenosisDescribeComments +----------+--------+--------+--------+--------+--------+ CCA Distal36      9                                +----------+--------+--------+--------+--------+--------+ ICA Prox  358     123     80-99%                   +----------+--------+--------+--------+--------+--------+ ICA Distal106     29                               +----------+--------+--------+--------+--------+--------+  Summary: Right Carotid: Right sided 80 to 99% ICA stenosis originates within the                bifurcation and extends approximately 1.1 cm into the proximal                ICA. There is no apparent hemodynamically significant stenosis                involving the mid or distal ICA. Left Carotid: Left sided 80 to 99% ICA stenosis originates within the               bifurcation and extends approximately 1.1 cm into the proximal               ICA. There is no apparent hemodynamically significant stenosis  involving the mid or distal ICA.  *See table(s) above for measurements and observations.  Electronically signed by Coral Else MD on 02/14/2019 at 1:35:19 PM.    Final    US Abdomen Limited Ruq  Result Date: 03/01/2019 CLINICAL DATA:  Elevated LFTs. EXAM: ULTRASOUND ABDOMEN LIMITED RIGHT UPPER QUADRANT COMPARISON:  None. FINDINGS: Gallbladder: Physiologically distended. No gallstones or wall thickening visualized. No sonographic Murphy sign noted by sonographer. Common bile duct: Diameter: 6 mm, normal. Liver: No focal lesion identified. Diffusely increased and heterogeneous in parenchymal echogenicity. Portal vein is patent on color Doppler imaging with normal direction of blood flow towards the liver. IMPRESSION: 1. Hepatic steatosis. 2. Normal sonographic appearance of gallbladder and biliary tree. Electronically Signed   By: Narda Rutherford M.D.   On: 03/01/2019 23:36    Microbiology No results found for this or any previous visit (from the past 240  hour(s)).  Lab Basic Metabolic Panel: No results for input(s): NA, K, CL, CO2, GLUCOSE, BUN, CREATININE, CALCIUM, MG, PHOS in the last 168 hours. Liver Function Tests: No results for input(s): AST, ALT, ALKPHOS, BILITOT, PROT, ALBUMIN in the last 168 hours. No results for input(s): LIPASE, AMYLASE in the last 168 hours. No results for input(s): AMMONIA in the last 168 hours. CBC: No results for input(s): WBC, NEUTROABS, HGB, HCT, MCV, PLT in the last 168 hours. Cardiac Enzymes: No results for input(s): CKTOTAL, CKMB, CKMBINDEX, TROPONINI in the last 168 hours. Sepsis Labs: No results for input(s): PROCALCITON, WBC, LATICACIDVEN in the last 168 hours.  Signed :    03/17/2019, 4:28 PM

## 2019-04-09 NOTE — Plan of Care (Signed)
  Problem: Clinical Measurements: Goal: Quality of life will improve Outcome: Progressing   Problem: Pain Management: Goal: Satisfaction with pain management regimen will improve Outcome: Progressing   

## 2019-04-09 NOTE — Progress Notes (Signed)
Pt expired at 0340. Pt was a DNR and comfort care. Death was expected. Death certificate completed.  KJKG, NP [facility]

## 2019-04-09 DEATH — deceased

## 2020-06-05 IMAGING — CR RIGHT FOOT COMPLETE - 3+ VIEW
3 series · 3 of 3 positions shown · non-contrast
Comparison: None.

CLINICAL DATA: Right foot pain

EXAM:
RIGHT FOOT COMPLETE - 3+ VIEW

[foot ap]
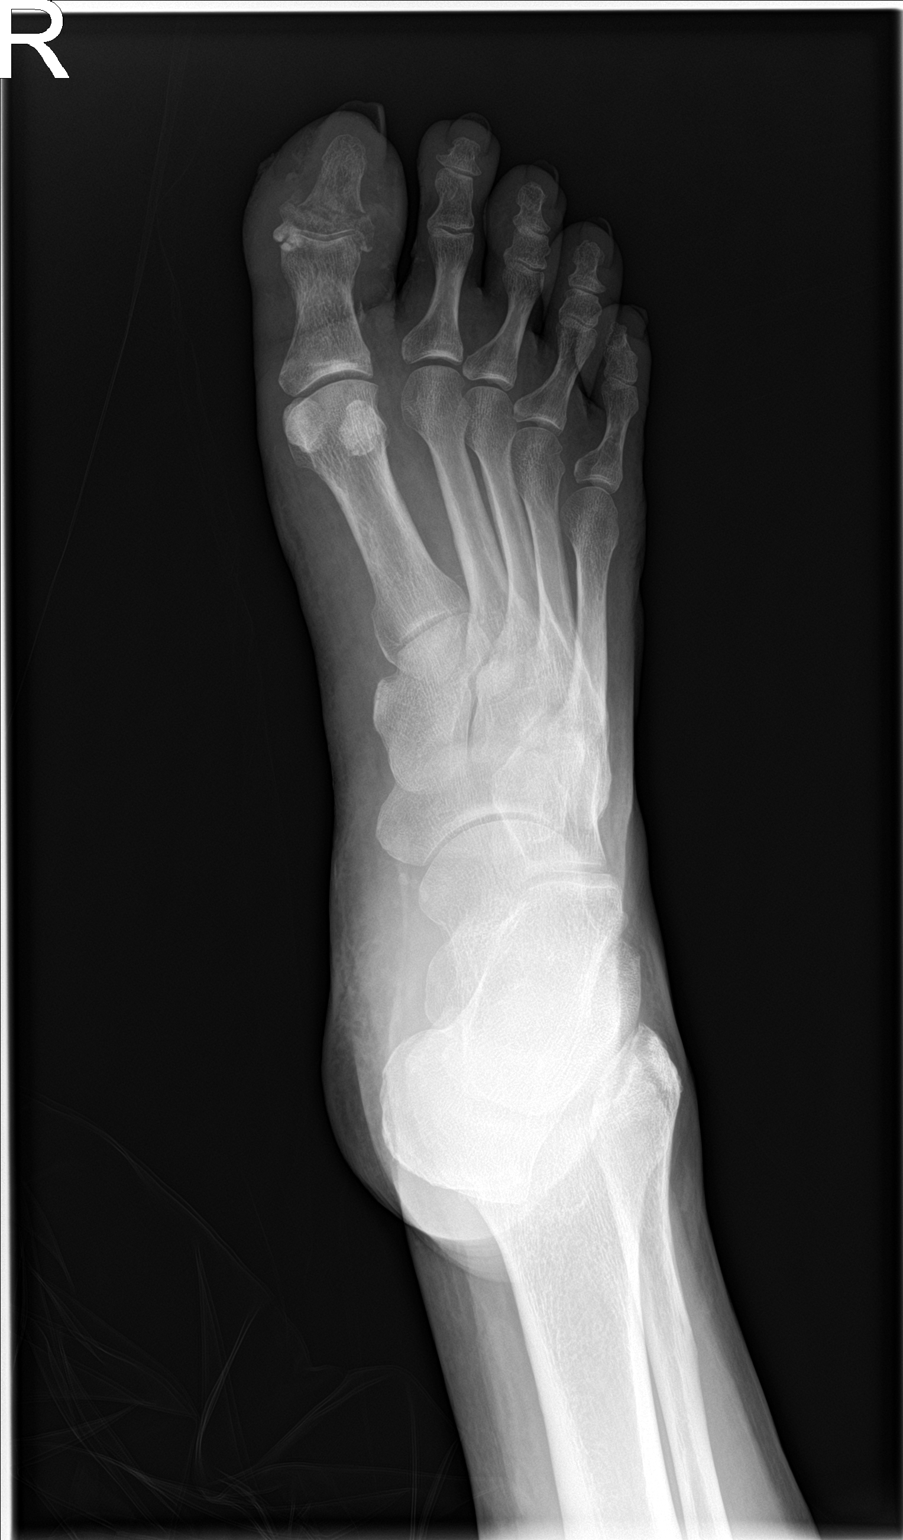

[foot obl]
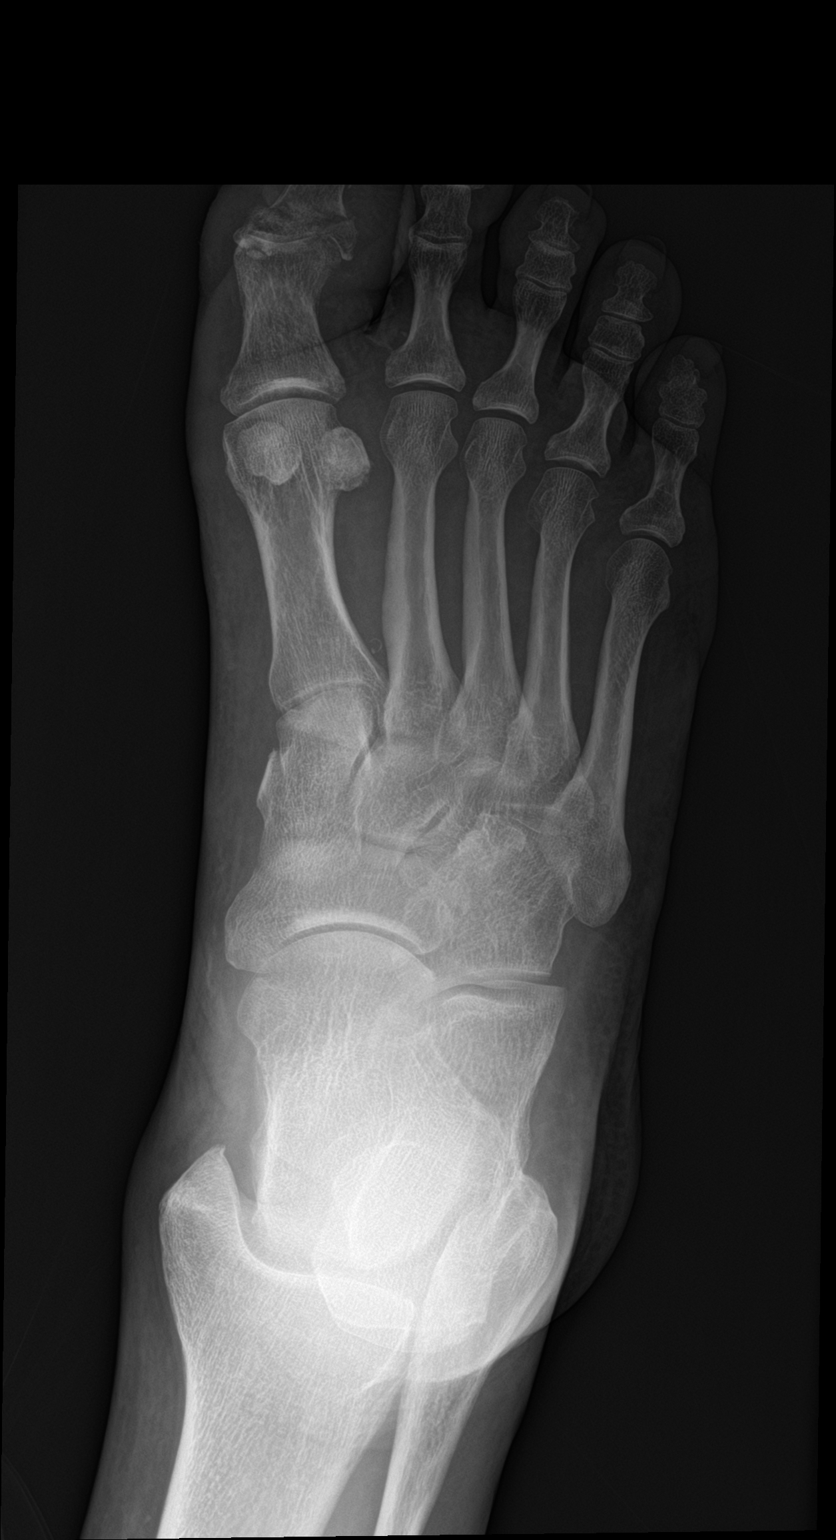

[foot lat]
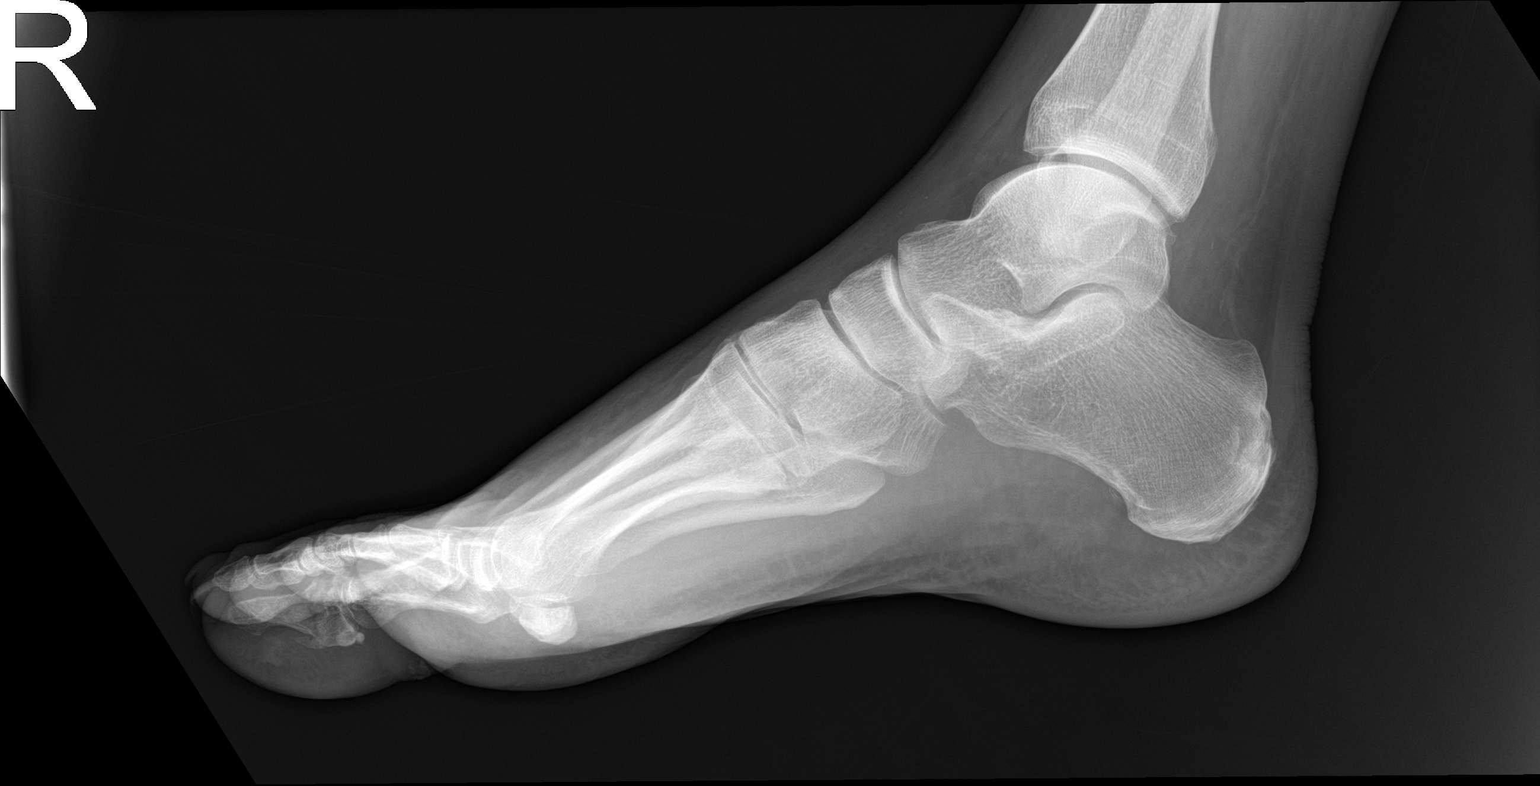

[3 of 3 positions shown; findings below may reference images not displayed]

FINDINGS: Comminuted fracture is noted at the base of the first distal
phalanx. This extends into the articular surface in multiple
locations. No other fracture is seen. Soft tissue swelling is noted.
IMPRESSION: Comminuted fracture of the first distal phalanx which extends to the
articular surface.

## 2020-06-06 IMAGING — CT CT ANGIOGRAPHY NECK
1 of 12 series · 5 of 33 positions shown · IV contrast (iopamidol)
Comparison: MRI of the brain 02/27/2019

CLINICAL DATA: Stroke follow-up. Acute/subacute nonhemorrhagic
infarct of the left internal capsule and globus pallidus.

EXAM:
CT ANGIOGRAPHY HEAD AND NECK
TECHNIQUE: Multidetector CT imaging of the head and neck was performed using
the standard protocol during bolus administration of intravenous
contrast. Multiplanar CT image reconstructions and MIPs were
obtained to evaluate the vascular anatomy. Carotid stenosis
measurements (when applicable) are obtained utilizing NASCET
criteria, using the distal internal carotid diameter as the
denominator.
CONTRAST:  75mL [id number] IOPAMIDOL ([id number]) INJECTION 76%

[Series 12: cta neck axial · axial · 0.39mm/px · z∈[-290,-36]mm · 5 of 383 slices shown]
[im 64/383  soft-tissue]
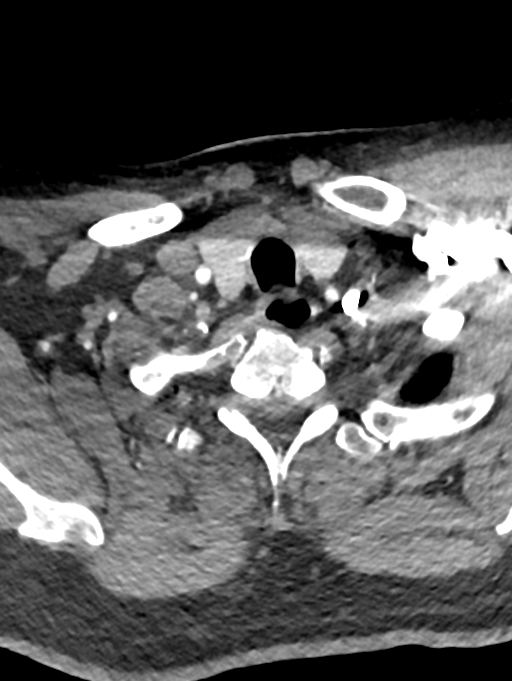
[im 128/383  bone]
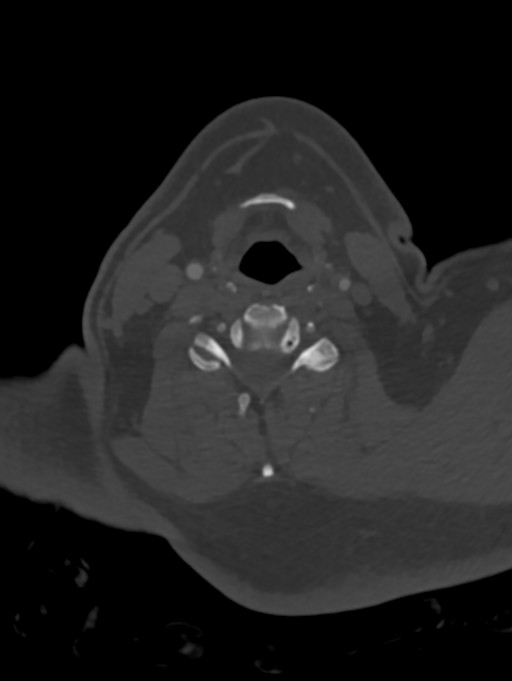
[im 192/383  soft-tissue]
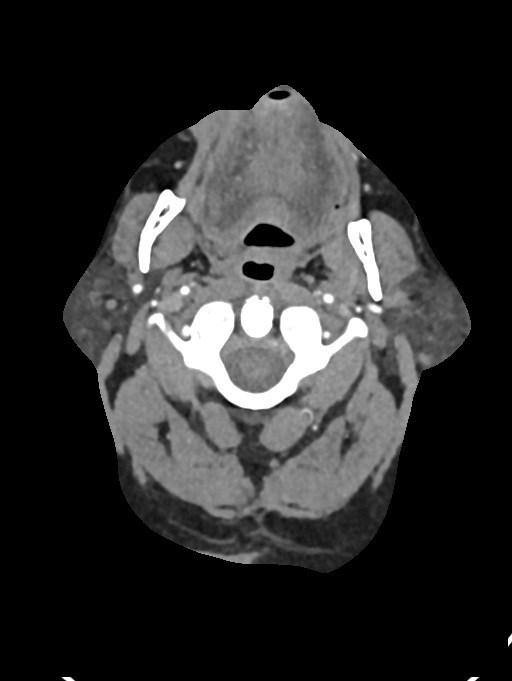
[im 255/383  bone]
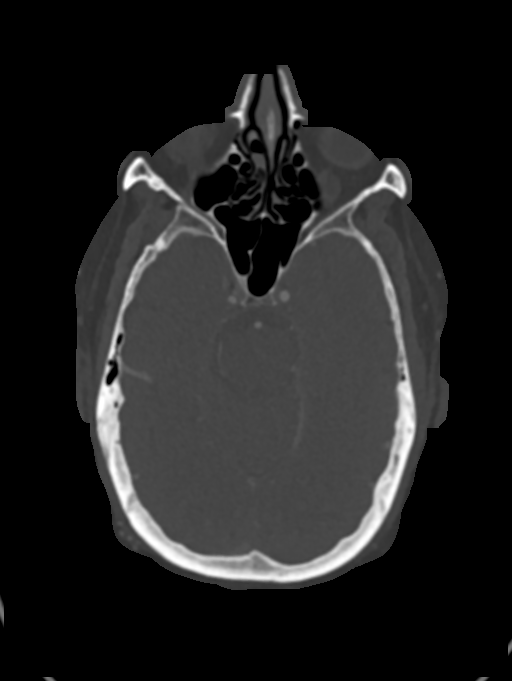
[im 319/383  soft-tissue]
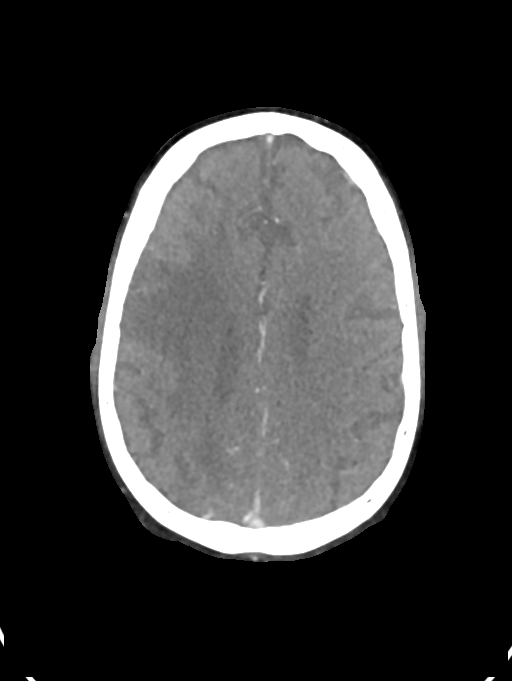

[5 of 33 positions shown; findings below may reference images not displayed]

FINDINGS: CT HEAD FINDINGS

Brain: The left internal capsule infarct is again noted, now
slightly lower density than on the previous CT. No new infarct is
present. The remote right MCA territory encephalomalacia is stable.

The ventricles are of proportionate to the degree of atrophy. No
significant extraaxial fluid collection is present. The brainstem
and cerebellum are within normal limits.

Vascular: Atherosclerotic calcifications are present within the
cavernous internal carotid arteries bilaterally. There is no
hyperdense vessel.

Skull: The craniocervical junction is normal. Upper cervical spine
is within normal limits. Marrow signal is unremarkable.

Sinuses: Scattered opacification of left ethmoid air cells are
present. There are no fluid levels. The paranasal sinuses and
mastoid air cells are otherwise clear.

Orbits: The globes and orbits are within normal limits.

Review of the MIP images confirms the above findings

CTA NECK FINDINGS

Aortic arch: A 3 vessel arch configuration is present. Minimal
atherosclerotic changes are present at the great vessel origins.
There is no significant stenosis or aneurysm.

Right carotid system: The right common carotid artery is within
normal limits. Dense calcifications are present at the right carotid
bifurcation. There is a high-grade stenosis at the carotid
bifurcation. Lumen is narrowed to less than 1 mm. The more distal
right common carotid artery is within normal limits to the skull
base.

Left carotid system: The left common carotid artery demonstrates
some atherosclerotic irregularity. There is a high-grade, near
occlusive stenosis of the left internal carotid artery at its
bifurcation. The cervical left ICA is otherwise normal.

Vertebral arteries: Extensive atherosclerotic calcifications are
present along the vertebral arteries bilaterally. The right
vertebral artery is dominant. There is a high-grade stenosis at the
proximal left vertebral artery. No significant stenosis is present
in the right vertebral artery. The left vertebral artery is
reconstituted at the distal V1 segment. Extensive atherosclerotic
changes are present throughout the V2 segment. These are high-grade
stenosis at the level of C1.

Skeleton: Vertebral body heights alignment are maintained. No focal
lytic or blastic lesions are present.

Other neck: The soft tissues the neck are otherwise unremarkable. No
focal mucosal or submucosal lesions are present. Salivary glands are
within normal limits. No significant adenopathy is present. Thyroid
is normal.

Upper chest: Next mild dependent atelectasis is present. The lung
apices are otherwise clear. Thoracic inlet is within normal limits.

Review of the MIP images confirms the above findings

CTA HEAD FINDINGS

Anterior circulation: Atherosclerotic calcifications are present
within the cavernous internal carotid arteries bilaterally without a
significant stenosis through the ICA termini. The left A1 is
hypoplastic. The right A1 is normal. The anterior communicating
artery is patent. ACA branch vessels are within normal limits
bilaterally. There is a high-grade stenosis of the anterior right M2
segment with marked attenuation of distal branches. Diffuse
irregularity present and more posterior left MCA branches. There is
moderate irregularity in left MCA branches. Pial collaterals are
evident.

Posterior circulation: The right vertebral artery is the dominant
vessel. Segmental irregularity is present in the left V4 segment
without a significant stenosis. PICA origins are visualized and
normal. The vertebrobasilar junction is normal. The basilar artery
is normal. Both posterior cerebral arteries originate from the
basilar tip. There is some irregularity of the proximal PCA vessels
without significant proximal stenosis. Branch vessels are intact.

Venous sinuses: The dural sinuses are patent.

Anatomic variants: None

Delayed phase: No pathologic enhancement is present. Infarcts are
well-defined.

Review of the MIP images confirms the above findings
IMPRESSION: 1. High-grade bilateral proximal ICA stenoses at the carotid
bifurcations.
2. High-grade stenosis of the proximal left vertebral artery with
reconstitution prior to the V2 segment.
3. Hypoplastic left A1 segment.
4. High-grade stenosis of the anterior right M2 segment.
5. Moderate diffuse medium and distal small vessel disease in both
the anterior and posterior circulations.
6. Expected evolution of left internal capsule nonhemorrhagic
infarct.
7. Stable chronic encephalomalacia of the right MCA territory.

## 2020-06-07 IMAGING — US ULTRASOUND ABDOMEN LIMITED
1 series · 14 of 25 positions shown · non-contrast
Comparison: None.

CLINICAL DATA: Elevated LFTs.

EXAM:
ULTRASOUND ABDOMEN LIMITED RIGHT UPPER QUADRANT

[Series 1: ultrasound abdomen limited · 14 of 29 slices shown]
[im 1/29]
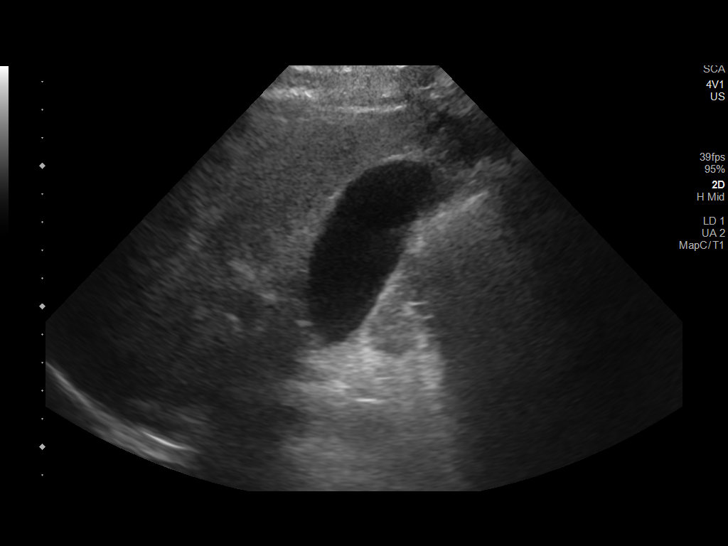
[im 3/29]
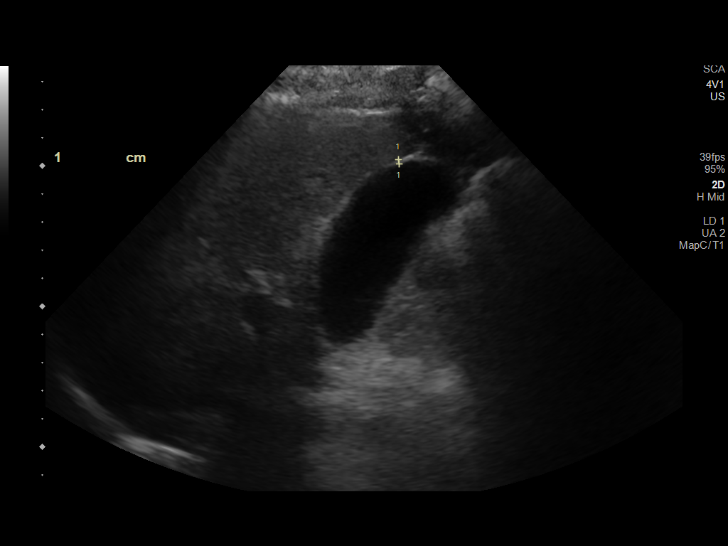
[im 5/29]
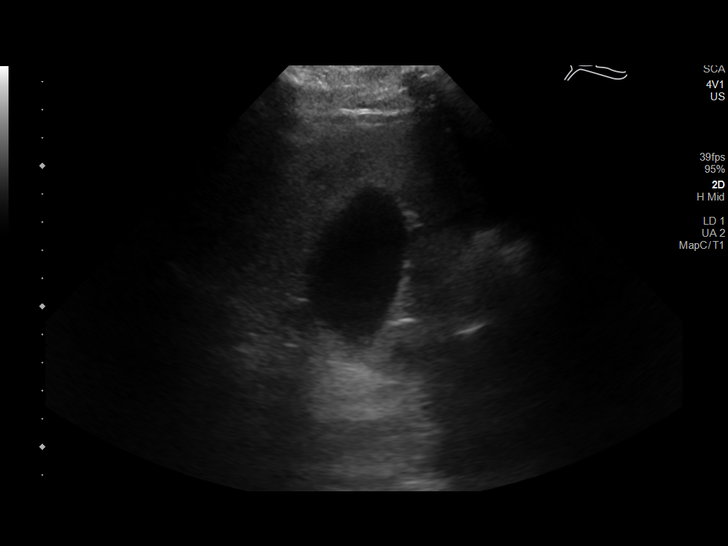
[im 8/29]
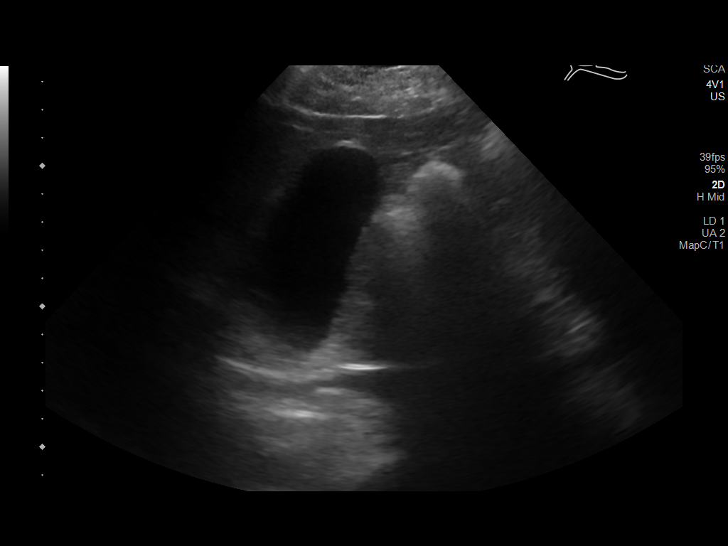
[im 10/29]
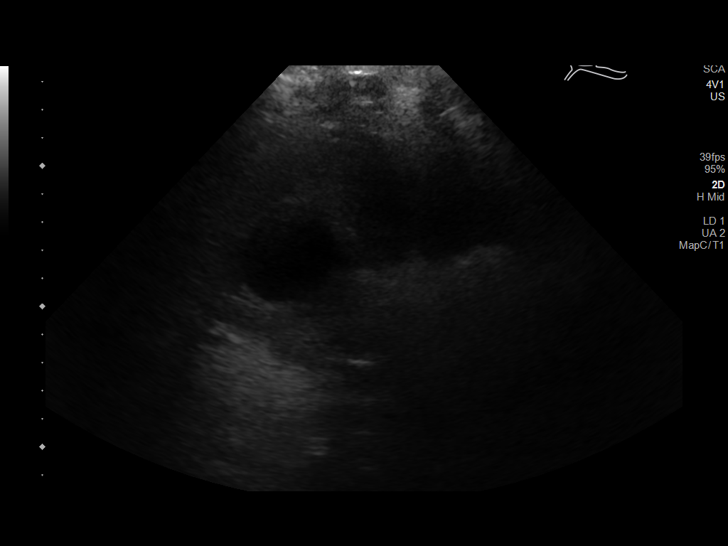
[im 11/29]
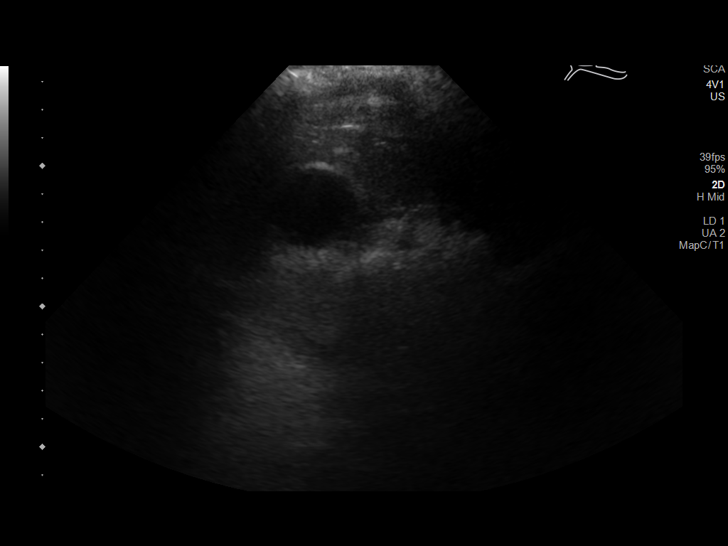
[im 13/29]
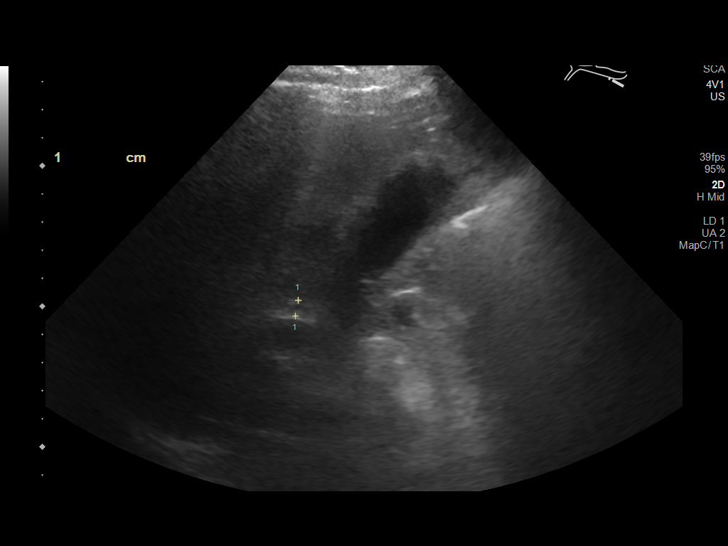
[im 16/29]
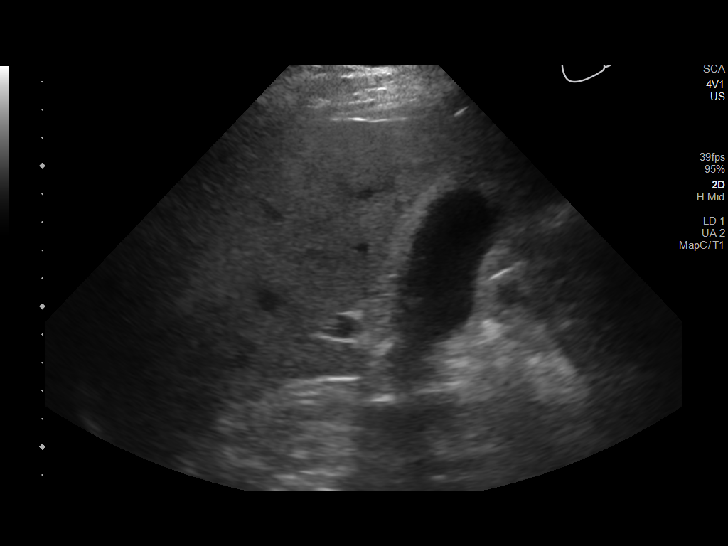
[im 18/29]
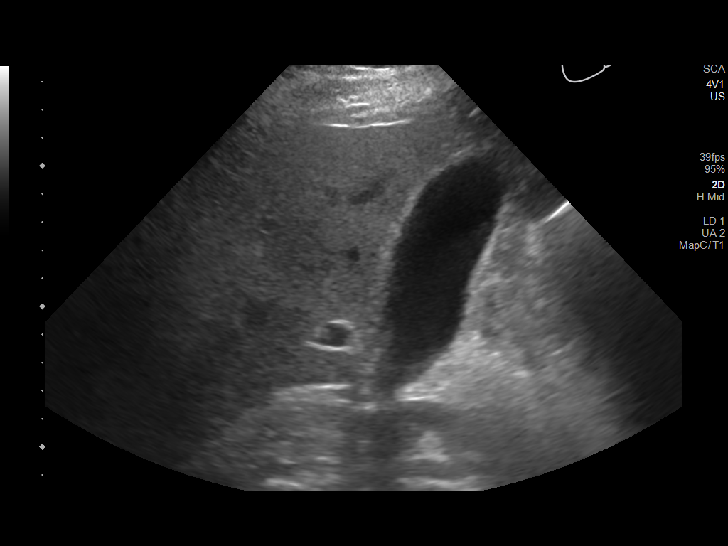
[im 19/29]
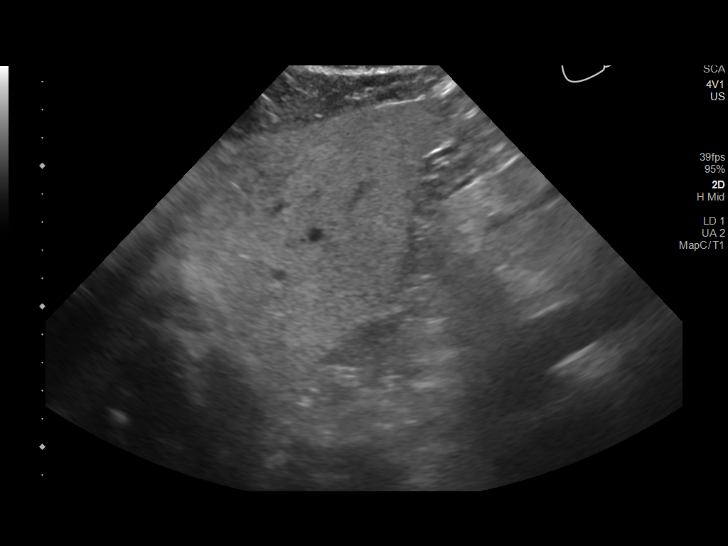
[im 22/29]
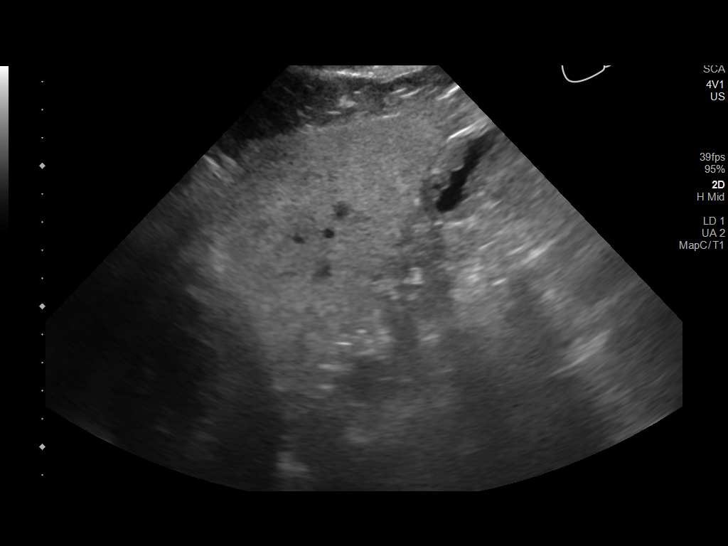
[im 24/29]
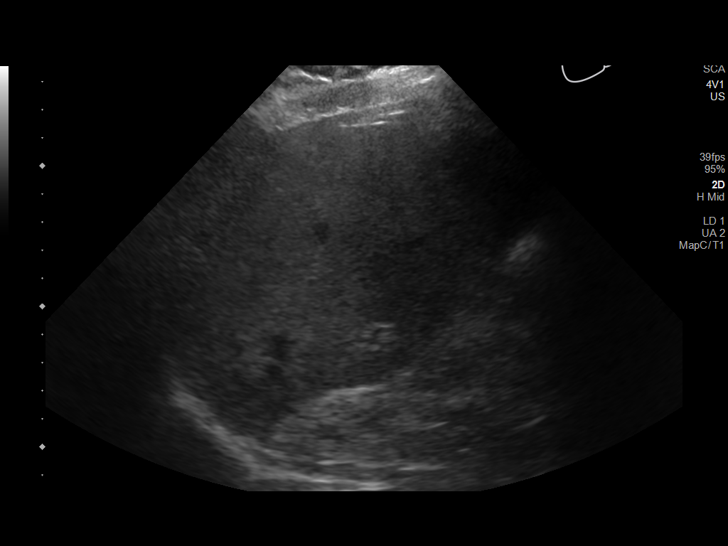
[im 26/29]
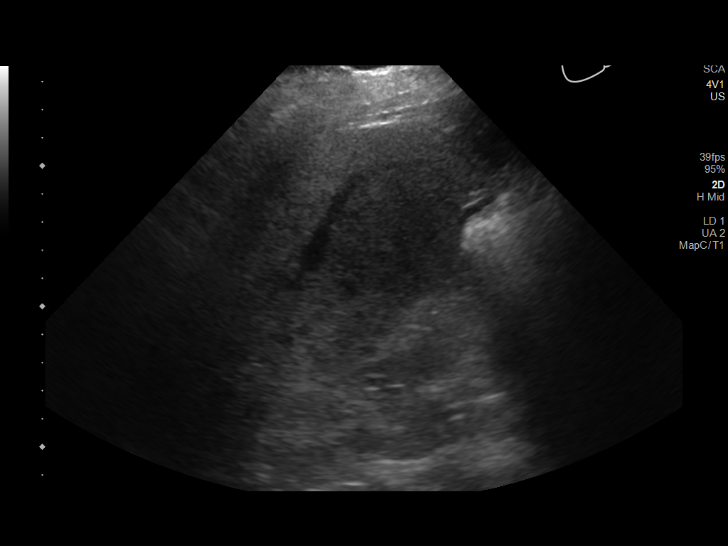
[im 29/29]
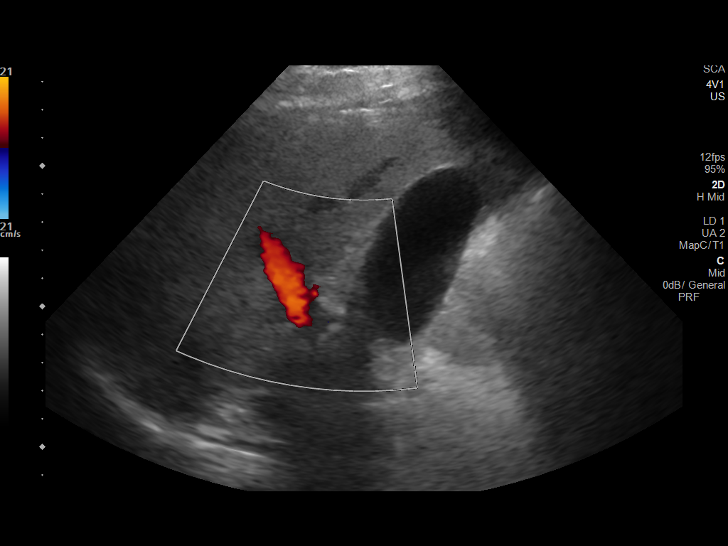

[14 of 25 positions shown; findings below may reference images not displayed]

FINDINGS: Gallbladder:

Physiologically distended. No gallstones or wall thickening
visualized. No sonographic Murphy sign noted by sonographer.

Common bile duct:

Diameter: 6 mm, normal.

Liver:

No focal lesion identified. Diffusely increased and heterogeneous in
parenchymal echogenicity. Portal vein is patent on color Doppler
imaging with normal direction of blood flow towards the liver.
IMPRESSION: 1. Hepatic steatosis.
2. Normal sonographic appearance of gallbladder and biliary tree.
